# Patient Record
Sex: Female | Born: 1945 | Race: White | Hispanic: No | Marital: Married | Smoking: Never smoker
Health system: Southern US, Community
[De-identification: ages and names within clinical notes are randomized; demographics above are authoritative.]

## PROBLEM LIST (undated history)

## (undated) DIAGNOSIS — K5090 Crohn's disease, unspecified, without complications: Secondary | ICD-10-CM

## (undated) DIAGNOSIS — J189 Pneumonia, unspecified organism: Secondary | ICD-10-CM

## (undated) DIAGNOSIS — E785 Hyperlipidemia, unspecified: Secondary | ICD-10-CM

## (undated) DIAGNOSIS — G40909 Epilepsy, unspecified, not intractable, without status epilepticus: Secondary | ICD-10-CM

## (undated) DIAGNOSIS — C50919 Malignant neoplasm of unspecified site of unspecified female breast: Secondary | ICD-10-CM

## (undated) DIAGNOSIS — K56609 Unspecified intestinal obstruction, unspecified as to partial versus complete obstruction: Secondary | ICD-10-CM

## (undated) DIAGNOSIS — H269 Unspecified cataract: Secondary | ICD-10-CM

## (undated) DIAGNOSIS — C549 Malignant neoplasm of corpus uteri, unspecified: Secondary | ICD-10-CM

## (undated) DIAGNOSIS — D696 Thrombocytopenia, unspecified: Secondary | ICD-10-CM

## (undated) DIAGNOSIS — Z860100 Personal history of colon polyps, unspecified: Secondary | ICD-10-CM

## (undated) DIAGNOSIS — M810 Age-related osteoporosis without current pathological fracture: Secondary | ICD-10-CM

## (undated) DIAGNOSIS — N281 Cyst of kidney, acquired: Secondary | ICD-10-CM

## (undated) DIAGNOSIS — K529 Noninfective gastroenteritis and colitis, unspecified: Secondary | ICD-10-CM

## (undated) HISTORY — DX: Malignant neoplasm of corpus uteri, unspecified: C54.9

## (undated) HISTORY — DX: Age-related osteoporosis without current pathological fracture: M81.0

## (undated) HISTORY — PX: EYE SURGERY: SHX253

## (undated) HISTORY — DX: Hyperlipidemia, unspecified: E78.5

## (undated) HISTORY — DX: Crohn's disease, unspecified, without complications: K50.90

## (undated) HISTORY — PX: CHOLECYSTECTOMY: SHX55

## (undated) HISTORY — DX: Thrombocytopenia, unspecified: D69.6

## (undated) HISTORY — DX: Unspecified intestinal obstruction, unspecified as to partial versus complete obstruction: K56.609

## (undated) HISTORY — PX: ABDOMINAL HYSTERECTOMY: SHX81

## (undated) HISTORY — DX: Noninfective gastroenteritis and colitis, unspecified: K52.9

## (undated) HISTORY — DX: Pneumonia, unspecified organism: J18.9

## (undated) HISTORY — PX: ELBOW SURGERY: SHX618

## (undated) HISTORY — PX: UPPER GI ENDOSCOPY: SHX6162

## (undated) HISTORY — DX: Epilepsy, unspecified, not intractable, without status epilepticus: G40.909

## (undated) HISTORY — PX: BREAST LUMPECTOMY: SHX2

## (undated) HISTORY — DX: Cyst of kidney, acquired: N28.1

## (undated) HISTORY — DX: Unspecified cataract: H26.9

## (undated) HISTORY — DX: Malignant neoplasm of unspecified site of unspecified female breast: C50.919

## (undated) HISTORY — DX: Personal history of colonic polyps: Z86.010

## (undated) HISTORY — PX: COLON RESECTION: SHX5231

## (undated) HISTORY — DX: Personal history of colon polyps, unspecified: Z86.0100

---

## 2004-12-08 ENCOUNTER — Ambulatory Visit: Payer: Self-pay | Admitting: Oncology

## 2006-01-21 ENCOUNTER — Ambulatory Visit: Payer: Self-pay | Admitting: Oncology

## 2006-11-02 ENCOUNTER — Ambulatory Visit: Admission: RE | Admit: 2006-11-02 | Discharge: 2006-11-02 | Payer: Self-pay | Admitting: Gynecologic Oncology

## 2006-11-29 ENCOUNTER — Inpatient Hospital Stay (HOSPITAL_COMMUNITY): Admission: RE | Admit: 2006-11-29 | Discharge: 2006-12-02 | Payer: Self-pay | Admitting: Obstetrics & Gynecology

## 2006-11-29 ENCOUNTER — Encounter (INDEPENDENT_AMBULATORY_CARE_PROVIDER_SITE_OTHER): Payer: Self-pay | Admitting: *Deleted

## 2007-01-10 ENCOUNTER — Ambulatory Visit: Admission: RE | Admit: 2007-01-10 | Discharge: 2007-01-10 | Payer: Self-pay | Admitting: Gynecologic Oncology

## 2007-03-06 ENCOUNTER — Ambulatory Visit: Payer: Self-pay | Admitting: Oncology

## 2007-03-08 LAB — CBC WITH DIFFERENTIAL/PLATELET
BASO%: 0.4 % (ref 0.0–2.0)
Eosinophils Absolute: 0 10*3/uL (ref 0.0–0.5)
MCHC: 34.9 g/dL (ref 32.0–36.0)
MONO#: 0.2 10*3/uL (ref 0.1–0.9)
NEUT#: 2.7 10*3/uL (ref 1.5–6.5)
RBC: 3.85 10*6/uL (ref 3.70–5.32)
RDW: 14.2 % (ref 11.3–14.5)
WBC: 4.4 10*3/uL (ref 3.9–10.0)
lymph#: 1.4 10*3/uL (ref 0.9–3.3)

## 2007-03-08 LAB — COMPREHENSIVE METABOLIC PANEL
ALT: 33 U/L (ref 0–35)
Albumin: 3.3 g/dL — ABNORMAL LOW (ref 3.5–5.2)
CO2: 25 mEq/L (ref 19–32)
Glucose, Bld: 124 mg/dL — ABNORMAL HIGH (ref 70–99)
Potassium: 3.5 mEq/L (ref 3.5–5.3)
Sodium: 143 mEq/L (ref 135–145)
Total Protein: 5.9 g/dL — ABNORMAL LOW (ref 6.0–8.3)

## 2007-04-18 ENCOUNTER — Encounter (INDEPENDENT_AMBULATORY_CARE_PROVIDER_SITE_OTHER): Payer: Self-pay | Admitting: Specialist

## 2007-04-18 ENCOUNTER — Ambulatory Visit: Admission: RE | Admit: 2007-04-18 | Discharge: 2007-04-18 | Payer: Self-pay | Admitting: Gynecologic Oncology

## 2007-04-18 ENCOUNTER — Other Ambulatory Visit: Admission: RE | Admit: 2007-04-18 | Discharge: 2007-04-18 | Payer: Self-pay | Admitting: Gynecologic Oncology

## 2007-08-15 ENCOUNTER — Encounter (INDEPENDENT_AMBULATORY_CARE_PROVIDER_SITE_OTHER): Payer: Self-pay | Admitting: Gynecologic Oncology

## 2007-08-15 ENCOUNTER — Ambulatory Visit: Admission: RE | Admit: 2007-08-15 | Discharge: 2007-08-15 | Payer: Self-pay | Admitting: Gynecologic Oncology

## 2007-08-15 ENCOUNTER — Other Ambulatory Visit: Admission: RE | Admit: 2007-08-15 | Discharge: 2007-08-15 | Payer: Self-pay | Admitting: Gynecologic Oncology

## 2007-11-21 ENCOUNTER — Other Ambulatory Visit: Admission: RE | Admit: 2007-11-21 | Discharge: 2007-11-21 | Payer: Self-pay | Admitting: Gynecologic Oncology

## 2007-11-21 ENCOUNTER — Encounter (INDEPENDENT_AMBULATORY_CARE_PROVIDER_SITE_OTHER): Payer: Self-pay | Admitting: Gynecologic Oncology

## 2007-11-21 ENCOUNTER — Ambulatory Visit: Admission: RE | Admit: 2007-11-21 | Discharge: 2007-11-21 | Payer: Self-pay | Admitting: Obstetrics and Gynecology

## 2008-03-18 ENCOUNTER — Ambulatory Visit: Payer: Self-pay | Admitting: Oncology

## 2008-03-20 LAB — COMPREHENSIVE METABOLIC PANEL
Albumin: 3.5 g/dL (ref 3.5–5.2)
CO2: 25 mEq/L (ref 19–32)
Calcium: 8.4 mg/dL (ref 8.4–10.5)
Chloride: 107 mEq/L (ref 96–112)
Glucose, Bld: 125 mg/dL — ABNORMAL HIGH (ref 70–99)
Potassium: 3.6 mEq/L (ref 3.5–5.3)
Sodium: 143 mEq/L (ref 135–145)
Total Bilirubin: 0.2 mg/dL — ABNORMAL LOW (ref 0.3–1.2)
Total Protein: 5.8 g/dL — ABNORMAL LOW (ref 6.0–8.3)

## 2008-03-20 LAB — CBC WITH DIFFERENTIAL/PLATELET
BASO%: 0.3 % (ref 0.0–2.0)
Basophils Absolute: 0 10*3/uL (ref 0.0–0.1)
Eosinophils Absolute: 0 10*3/uL (ref 0.0–0.5)
HCT: 33.5 % — ABNORMAL LOW (ref 34.8–46.6)
HGB: 11.6 g/dL (ref 11.6–15.9)
LYMPH%: 32.2 % (ref 14.0–48.0)
MONO#: 0.2 10*3/uL (ref 0.1–0.9)
NEUT%: 62.8 % (ref 39.6–76.8)
Platelets: 196 10*3/uL (ref 145–400)
WBC: 5.1 10*3/uL (ref 3.9–10.0)
lymph#: 1.6 10*3/uL (ref 0.9–3.3)

## 2008-05-14 ENCOUNTER — Encounter (INDEPENDENT_AMBULATORY_CARE_PROVIDER_SITE_OTHER): Payer: Self-pay | Admitting: Gynecologic Oncology

## 2008-05-14 ENCOUNTER — Other Ambulatory Visit: Admission: RE | Admit: 2008-05-14 | Discharge: 2008-05-14 | Payer: Self-pay | Admitting: Gynecologic Oncology

## 2008-05-14 ENCOUNTER — Ambulatory Visit: Admission: RE | Admit: 2008-05-14 | Discharge: 2008-05-14 | Payer: Self-pay | Admitting: Gynecologic Oncology

## 2008-11-12 ENCOUNTER — Encounter (INDEPENDENT_AMBULATORY_CARE_PROVIDER_SITE_OTHER): Payer: Self-pay | Admitting: Gynecologic Oncology

## 2008-11-12 ENCOUNTER — Ambulatory Visit: Admission: RE | Admit: 2008-11-12 | Discharge: 2008-11-12 | Payer: Self-pay | Admitting: Gynecologic Oncology

## 2008-11-12 ENCOUNTER — Other Ambulatory Visit: Admission: RE | Admit: 2008-11-12 | Discharge: 2008-11-12 | Payer: Self-pay | Admitting: Gynecologic Oncology

## 2009-03-17 ENCOUNTER — Ambulatory Visit: Payer: Self-pay | Admitting: Oncology

## 2009-03-19 LAB — COMPREHENSIVE METABOLIC PANEL
ALT: 34 U/L (ref 0–35)
BUN: 13 mg/dL (ref 6–23)
CO2: 21 mEq/L (ref 19–32)
Calcium: 8.5 mg/dL (ref 8.4–10.5)
Chloride: 109 mEq/L (ref 96–112)
Creatinine, Ser: 1.01 mg/dL (ref 0.40–1.20)
Total Bilirubin: 0.3 mg/dL (ref 0.3–1.2)

## 2009-03-19 LAB — CBC WITH DIFFERENTIAL/PLATELET
BASO%: 0.2 % (ref 0.0–2.0)
Basophils Absolute: 0 10*3/uL (ref 0.0–0.1)
EOS%: 1.2 % (ref 0.0–7.0)
HCT: 35.9 % (ref 34.8–46.6)
HGB: 12.5 g/dL (ref 11.6–15.9)
LYMPH%: 31.7 % (ref 14.0–49.7)
MCH: 31.7 pg (ref 25.1–34.0)
MCHC: 34.8 g/dL (ref 31.5–36.0)
MONO#: 0.2 10*3/uL (ref 0.1–0.9)
NEUT%: 62.4 % (ref 38.4–76.8)
Platelets: 118 10*3/uL — ABNORMAL LOW (ref 145–400)
lymph#: 1.4 10*3/uL (ref 0.9–3.3)

## 2009-05-09 ENCOUNTER — Ambulatory Visit: Payer: Self-pay | Admitting: Oncology

## 2009-05-14 ENCOUNTER — Other Ambulatory Visit: Admission: RE | Admit: 2009-05-14 | Discharge: 2009-05-14 | Payer: Self-pay | Admitting: Gynecologic Oncology

## 2009-05-14 ENCOUNTER — Ambulatory Visit: Admission: RE | Admit: 2009-05-14 | Discharge: 2009-05-14 | Payer: Self-pay | Admitting: Gynecologic Oncology

## 2009-05-14 ENCOUNTER — Encounter (INDEPENDENT_AMBULATORY_CARE_PROVIDER_SITE_OTHER): Payer: Self-pay | Admitting: Gynecologic Oncology

## 2009-05-14 LAB — CBC WITH DIFFERENTIAL/PLATELET
BASO%: 0.7 % (ref 0.0–2.0)
Basophils Absolute: 0 10*3/uL (ref 0.0–0.1)
EOS%: 1.3 % (ref 0.0–7.0)
HCT: 36 % (ref 34.8–46.6)
HGB: 12.5 g/dL (ref 11.6–15.9)
MCH: 31.5 pg (ref 25.1–34.0)
MCHC: 34.7 g/dL (ref 31.5–36.0)
MONO#: 0.2 10*3/uL (ref 0.1–0.9)
RDW: 14 % (ref 11.2–14.5)
WBC: 4.6 10*3/uL (ref 3.9–10.3)
lymph#: 1.5 10*3/uL (ref 0.9–3.3)

## 2009-05-14 LAB — MORPHOLOGY: RBC Comments: NORMAL

## 2009-11-12 ENCOUNTER — Encounter (INDEPENDENT_AMBULATORY_CARE_PROVIDER_SITE_OTHER): Payer: Self-pay | Admitting: *Deleted

## 2009-11-12 ENCOUNTER — Other Ambulatory Visit: Admission: RE | Admit: 2009-11-12 | Discharge: 2009-11-12 | Payer: Self-pay | Admitting: Gynecologic Oncology

## 2009-11-12 ENCOUNTER — Ambulatory Visit: Admission: RE | Admit: 2009-11-12 | Discharge: 2009-11-12 | Payer: Self-pay | Admitting: Gynecologic Oncology

## 2009-12-03 DIAGNOSIS — M810 Age-related osteoporosis without current pathological fracture: Secondary | ICD-10-CM | POA: Insufficient documentation

## 2009-12-03 DIAGNOSIS — E785 Hyperlipidemia, unspecified: Secondary | ICD-10-CM | POA: Insufficient documentation

## 2009-12-03 DIAGNOSIS — R569 Unspecified convulsions: Secondary | ICD-10-CM | POA: Insufficient documentation

## 2009-12-03 DIAGNOSIS — C50919 Malignant neoplasm of unspecified site of unspecified female breast: Secondary | ICD-10-CM | POA: Insufficient documentation

## 2009-12-03 DIAGNOSIS — Z8719 Personal history of other diseases of the digestive system: Secondary | ICD-10-CM | POA: Insufficient documentation

## 2009-12-03 DIAGNOSIS — R197 Diarrhea, unspecified: Secondary | ICD-10-CM | POA: Insufficient documentation

## 2009-12-03 DIAGNOSIS — C549 Malignant neoplasm of corpus uteri, unspecified: Secondary | ICD-10-CM | POA: Insufficient documentation

## 2009-12-03 DIAGNOSIS — R109 Unspecified abdominal pain: Secondary | ICD-10-CM | POA: Insufficient documentation

## 2009-12-03 DIAGNOSIS — D126 Benign neoplasm of colon, unspecified: Secondary | ICD-10-CM | POA: Insufficient documentation

## 2009-12-09 ENCOUNTER — Ambulatory Visit: Payer: Self-pay | Admitting: Gastroenterology

## 2009-12-09 DIAGNOSIS — K624 Stenosis of anus and rectum: Secondary | ICD-10-CM | POA: Insufficient documentation

## 2009-12-09 DIAGNOSIS — E538 Deficiency of other specified B group vitamins: Secondary | ICD-10-CM | POA: Insufficient documentation

## 2009-12-09 DIAGNOSIS — R1031 Right lower quadrant pain: Secondary | ICD-10-CM | POA: Insufficient documentation

## 2009-12-09 DIAGNOSIS — Z8542 Personal history of malignant neoplasm of other parts of uterus: Secondary | ICD-10-CM | POA: Insufficient documentation

## 2009-12-09 LAB — CONVERTED CEMR LAB
AST: 38 units/L — ABNORMAL HIGH (ref 0–37)
Basophils Absolute: 0 10*3/uL (ref 0.0–0.1)
Basophils Relative: 0.9 % (ref 0.0–3.0)
Bilirubin, Direct: 0 mg/dL (ref 0.0–0.3)
CO2: 28 meq/L (ref 19–32)
Chloride: 109 meq/L (ref 96–112)
Eosinophils Relative: 1.3 % (ref 0.0–5.0)
Folate: 9 ng/mL
GFR calc non Af Amer: 66.69 mL/min (ref >60)
Glucose, Bld: 139 mg/dL — ABNORMAL HIGH (ref 70–99)
HCT: 38.1 % (ref 36.0–46.0)
Hemoglobin: 12.9 g/dL (ref 12.0–15.0)
Lymphocytes Relative: 21.7 % (ref 12.0–46.0)
MCHC: 33.8 g/dL (ref 30.0–36.0)
MCV: 96.3 fL (ref 78.0–100.0)
Monocytes Relative: 4.6 % (ref 3.0–12.0)
Platelets: 145 10*3/uL — ABNORMAL LOW (ref 150.0–400.0)
Potassium: 3.5 meq/L (ref 3.5–5.1)
RBC: 3.95 M/uL (ref 3.87–5.11)
Sed Rate: 20 mm/hr (ref 0–22)
Transferrin: 237.4 mg/dL (ref 212.0–360.0)
Vitamin B-12: 826 pg/mL (ref 211–911)

## 2009-12-10 ENCOUNTER — Telehealth: Payer: Self-pay | Admitting: Gastroenterology

## 2009-12-15 ENCOUNTER — Encounter: Payer: Self-pay | Admitting: Gastroenterology

## 2010-01-01 ENCOUNTER — Ambulatory Visit: Payer: Self-pay | Admitting: Gastroenterology

## 2010-01-01 DIAGNOSIS — R748 Abnormal levels of other serum enzymes: Secondary | ICD-10-CM | POA: Insufficient documentation

## 2010-02-03 ENCOUNTER — Ambulatory Visit: Payer: Self-pay | Admitting: Gastroenterology

## 2010-02-03 ENCOUNTER — Telehealth: Payer: Self-pay | Admitting: Gastroenterology

## 2010-03-16 ENCOUNTER — Ambulatory Visit: Payer: Self-pay | Admitting: Oncology

## 2010-03-18 ENCOUNTER — Encounter: Payer: Self-pay | Admitting: Gastroenterology

## 2010-03-24 ENCOUNTER — Encounter (INDEPENDENT_AMBULATORY_CARE_PROVIDER_SITE_OTHER): Payer: Self-pay | Admitting: *Deleted

## 2010-03-24 ENCOUNTER — Ambulatory Visit: Payer: Self-pay | Admitting: Gastroenterology

## 2010-03-24 DIAGNOSIS — E876 Hypokalemia: Secondary | ICD-10-CM | POA: Insufficient documentation

## 2010-03-24 DIAGNOSIS — K5000 Crohn's disease of small intestine without complications: Secondary | ICD-10-CM | POA: Insufficient documentation

## 2010-03-25 LAB — CONVERTED CEMR LAB
AST: 26 units/L (ref 0–37)
Albumin: 3.3 g/dL — ABNORMAL LOW (ref 3.5–5.2)
Alkaline Phosphatase: 80 units/L (ref 39–117)
Bilirubin Urine: NEGATIVE
CO2: 28 meq/L (ref 19–32)
CRP, High Sensitivity: 1.6 (ref 0.00–5.00)
Calcium: 8.7 mg/dL (ref 8.4–10.5)
GFR calc non Af Amer: 66.63 mL/min (ref >60)
HCT: 35.6 % — ABNORMAL LOW (ref 36.0–46.0)
Hemoglobin: 12.2 g/dL (ref 12.0–15.0)
Lymphocytes Relative: 26.8 % (ref 12.0–46.0)
Monocytes Relative: 4.1 % (ref 3.0–12.0)
Neutro Abs: 3.1 10*3/uL (ref 1.4–7.7)
Nitrite: NEGATIVE
Potassium: 4.5 meq/L (ref 3.5–5.1)
Sed Rate: 14 mm/hr (ref 0–22)
Sodium: 145 meq/L (ref 135–145)
Specific Gravity, Urine: 1.02 (ref 1.000–1.030)
TSH: 3.08 microintl units/mL (ref 0.35–5.50)
Total Bilirubin: 0.4 mg/dL (ref 0.3–1.2)
Total Protein: 5.9 g/dL — ABNORMAL LOW (ref 6.0–8.3)
Urine Glucose: NEGATIVE mg/dL
Urobilinogen, UA: 0.2 (ref 0.0–1.0)
WBC: 4.6 10*3/uL (ref 4.5–10.5)
pH: 5 (ref 5.0–8.0)

## 2010-03-26 ENCOUNTER — Telehealth: Payer: Self-pay | Admitting: Gastroenterology

## 2010-04-22 ENCOUNTER — Ambulatory Visit: Payer: Self-pay | Admitting: Gastroenterology

## 2010-04-23 ENCOUNTER — Telehealth: Payer: Self-pay | Admitting: Gastroenterology

## 2010-04-24 ENCOUNTER — Encounter: Payer: Self-pay | Admitting: Gastroenterology

## 2010-05-12 ENCOUNTER — Telehealth: Payer: Self-pay | Admitting: Gastroenterology

## 2010-05-13 ENCOUNTER — Encounter: Payer: Self-pay | Admitting: Gastroenterology

## 2010-05-13 ENCOUNTER — Other Ambulatory Visit: Admission: RE | Admit: 2010-05-13 | Discharge: 2010-05-13 | Payer: Self-pay | Admitting: Gynecologic Oncology

## 2010-05-13 ENCOUNTER — Ambulatory Visit: Admission: RE | Admit: 2010-05-13 | Discharge: 2010-05-13 | Payer: Self-pay | Admitting: Gynecologic Oncology

## 2010-05-20 ENCOUNTER — Telehealth: Payer: Self-pay | Admitting: Gastroenterology

## 2010-05-22 ENCOUNTER — Telehealth: Payer: Self-pay | Admitting: Gastroenterology

## 2010-05-25 ENCOUNTER — Ambulatory Visit: Payer: Self-pay | Admitting: Gastroenterology

## 2010-06-22 ENCOUNTER — Ambulatory Visit: Payer: Self-pay | Admitting: Gastroenterology

## 2010-06-23 LAB — CONVERTED CEMR LAB
Bilirubin, Direct: 0 mg/dL (ref 0.0–0.3)
Eosinophils Absolute: 0 10*3/uL (ref 0.0–0.7)
Eosinophils Relative: 0.9 % (ref 0.0–5.0)
Hemoglobin: 10.8 g/dL — ABNORMAL LOW (ref 12.0–15.0)
Lymphs Abs: 1.2 10*3/uL (ref 0.7–4.0)
MCHC: 34.3 g/dL (ref 30.0–36.0)
MCV: 96.8 fL (ref 78.0–100.0)
Monocytes Absolute: 0.2 10*3/uL (ref 0.1–1.0)
Neutro Abs: 3.5 10*3/uL (ref 1.4–7.7)
RBC: 3.26 M/uL — ABNORMAL LOW (ref 3.87–5.11)
RDW: 14.5 % (ref 11.5–14.6)
Total Protein: 5.5 g/dL — ABNORMAL LOW (ref 6.0–8.3)
WBC: 5 10*3/uL (ref 4.5–10.5)

## 2010-06-24 LAB — CONVERTED CEMR LAB
AST: 24 units/L (ref 0–37)
Albumin: 3 g/dL — ABNORMAL LOW (ref 3.5–5.2)
Alkaline Phosphatase: 100 units/L (ref 39–117)
Basophils Absolute: 0 10*3/uL (ref 0.0–0.1)
Bilirubin, Direct: 0.1 mg/dL (ref 0.0–0.3)
Eosinophils Absolute: 0 10*3/uL (ref 0.0–0.7)
HCT: 32 % — ABNORMAL LOW (ref 36.0–46.0)
MCV: 99 fL (ref 78.0–100.0)
Monocytes Relative: 3.6 % (ref 3.0–12.0)
Neutrophils Relative %: 71.2 % (ref 43.0–77.0)
Platelets: 163 10*3/uL (ref 150.0–400.0)
RBC: 3.24 M/uL — ABNORMAL LOW (ref 3.87–5.11)
RDW: 15.3 % — ABNORMAL HIGH (ref 11.5–14.6)
Total Bilirubin: 0.2 mg/dL — ABNORMAL LOW (ref 0.3–1.2)
Total Protein: 5.6 g/dL — ABNORMAL LOW (ref 6.0–8.3)
WBC: 5.1 10*3/uL (ref 4.5–10.5)

## 2010-07-20 ENCOUNTER — Ambulatory Visit: Payer: Self-pay | Admitting: Gastroenterology

## 2010-07-21 LAB — CONVERTED CEMR LAB
ALT: 103 units/L — ABNORMAL HIGH (ref 0–35)
Alkaline Phosphatase: 94 units/L (ref 39–117)
Basophils Absolute: 0 10*3/uL (ref 0.0–0.1)
Basophils Relative: 0.5 % (ref 0.0–3.0)
CO2: 30 meq/L (ref 19–32)
Calcium: 8.4 mg/dL (ref 8.4–10.5)
Chloride: 107 meq/L (ref 96–112)
Creatinine, Ser: 0.7 mg/dL (ref 0.4–1.2)
Eosinophils Absolute: 0 10*3/uL (ref 0.0–0.7)
GFR calc non Af Amer: 91.99 mL/min (ref >60)
Glucose, Bld: 94 mg/dL (ref 70–99)
Hemoglobin: 11.1 g/dL — ABNORMAL LOW (ref 12.0–15.0)
Lymphocytes Relative: 20.4 % (ref 12.0–46.0)
MCHC: 34.8 g/dL (ref 30.0–36.0)
MCV: 99.4 fL (ref 78.0–100.0)
Monocytes Absolute: 0.1 10*3/uL (ref 0.1–1.0)
Monocytes Relative: 4.3 % (ref 3.0–12.0)
Neutro Abs: 2.5 10*3/uL (ref 1.4–7.7)
RBC: 3.2 M/uL — ABNORMAL LOW (ref 3.87–5.11)
WBC: 3.3 10*3/uL — ABNORMAL LOW (ref 4.5–10.5)

## 2010-07-27 ENCOUNTER — Ambulatory Visit: Payer: Self-pay | Admitting: Cardiology

## 2010-07-28 ENCOUNTER — Ambulatory Visit: Payer: Self-pay | Admitting: Gastroenterology

## 2010-08-03 ENCOUNTER — Ambulatory Visit: Payer: Self-pay | Admitting: Gastroenterology

## 2010-08-05 ENCOUNTER — Ambulatory Visit: Payer: Self-pay

## 2010-08-05 ENCOUNTER — Encounter: Payer: Self-pay | Admitting: Gastroenterology

## 2010-08-05 LAB — CONVERTED CEMR LAB
Basophils Absolute: 0 10*3/uL (ref 0.0–0.1)
Basophils Relative: 0.4 % (ref 0.0–3.0)
Lymphocytes Relative: 22.5 % (ref 12.0–46.0)
MCHC: 34.5 g/dL (ref 30.0–36.0)
MCV: 99 fL (ref 78.0–100.0)
Monocytes Absolute: 0.3 10*3/uL (ref 0.1–1.0)
Monocytes Relative: 5 % (ref 3.0–12.0)
Neutro Abs: 4.5 10*3/uL (ref 1.4–7.7)
Neutrophils Relative %: 71.3 % (ref 43.0–77.0)
WBC: 6.3 10*3/uL (ref 4.5–10.5)

## 2010-08-25 ENCOUNTER — Ambulatory Visit: Payer: Self-pay | Admitting: Gastroenterology

## 2010-08-25 LAB — CONVERTED CEMR LAB
Basophils Absolute: 0 10*3/uL (ref 0.0–0.1)
Eosinophils Relative: 0.8 % (ref 0.0–5.0)
HCT: 35.9 % — ABNORMAL LOW (ref 36.0–46.0)
Hemoglobin: 12.3 g/dL (ref 12.0–15.0)
Lymphs Abs: 1 10*3/uL (ref 0.7–4.0)
MCHC: 34.3 g/dL (ref 30.0–36.0)
MCV: 98.4 fL (ref 78.0–100.0)
Monocytes Absolute: 0.2 10*3/uL (ref 0.1–1.0)
Neutro Abs: 3.8 10*3/uL (ref 1.4–7.7)
RBC: 3.65 M/uL — ABNORMAL LOW (ref 3.87–5.11)
RDW: 14.4 % (ref 11.5–14.6)

## 2010-09-30 ENCOUNTER — Ambulatory Visit: Payer: Self-pay | Admitting: Gastroenterology

## 2010-10-02 LAB — CONVERTED CEMR LAB
Basophils Absolute: 0 10*3/uL (ref 0.0–0.1)
Eosinophils Absolute: 0 10*3/uL (ref 0.0–0.7)
Hemoglobin: 11.4 g/dL — ABNORMAL LOW (ref 12.0–15.0)
MCHC: 34.7 g/dL (ref 30.0–36.0)
Monocytes Absolute: 0.2 10*3/uL (ref 0.1–1.0)
Neutro Abs: 1.8 10*3/uL (ref 1.4–7.7)
Neutrophils Relative %: 46 % (ref 43.0–77.0)
Platelets: 170 10*3/uL (ref 150.0–400.0)
RBC: 3.38 M/uL — ABNORMAL LOW (ref 3.87–5.11)
RDW: 14.8 % — ABNORMAL HIGH (ref 11.5–14.6)
WBC: 3.9 10*3/uL — ABNORMAL LOW (ref 4.5–10.5)

## 2010-10-30 ENCOUNTER — Telehealth: Payer: Self-pay | Admitting: Gastroenterology

## 2010-11-02 ENCOUNTER — Ambulatory Visit: Payer: Self-pay | Admitting: Gastroenterology

## 2010-11-02 LAB — CONVERTED CEMR LAB
Basophils Absolute: 0 10*3/uL (ref 0.0–0.1)
Eosinophils Relative: 0.7 % (ref 0.0–5.0)
Lymphocytes Relative: 32.1 % (ref 12.0–46.0)
Lymphs Abs: 1.4 10*3/uL (ref 0.7–4.0)
MCV: 99.2 fL (ref 78.0–100.0)
Monocytes Absolute: 0.3 10*3/uL (ref 0.1–1.0)
Monocytes Relative: 6.1 % (ref 3.0–12.0)
Neutro Abs: 2.6 10*3/uL (ref 1.4–7.7)
Neutrophils Relative %: 60.5 % (ref 43.0–77.0)
Platelets: 132 10*3/uL — ABNORMAL LOW (ref 150.0–400.0)
RBC: 3.26 M/uL — ABNORMAL LOW (ref 3.87–5.11)
RDW: 15.3 % — ABNORMAL HIGH (ref 11.5–14.6)

## 2010-11-13 ENCOUNTER — Telehealth: Payer: Self-pay | Admitting: Gastroenterology

## 2010-11-16 ENCOUNTER — Ambulatory Visit: Payer: Self-pay | Admitting: Gastroenterology

## 2010-11-18 ENCOUNTER — Ambulatory Visit
Admission: RE | Admit: 2010-11-18 | Discharge: 2010-11-18 | Payer: Self-pay | Source: Home / Self Care | Admitting: Gynecologic Oncology

## 2010-11-18 ENCOUNTER — Encounter: Payer: Self-pay | Admitting: Gastroenterology

## 2010-11-19 LAB — CONVERTED CEMR LAB
Eosinophils Absolute: 0 10*3/uL (ref 0.0–0.7)
Lymphs Abs: 1 10*3/uL (ref 0.7–4.0)
MCHC: 34.8 g/dL (ref 30.0–36.0)
Neutro Abs: 3.6 10*3/uL (ref 1.4–7.7)
RBC: 3.44 M/uL — ABNORMAL LOW (ref 3.87–5.11)
RDW: 14.9 % — ABNORMAL HIGH (ref 11.5–14.6)
WBC: 4.9 10*3/uL (ref 4.5–10.5)

## 2010-11-24 ENCOUNTER — Encounter: Payer: Self-pay | Admitting: Gastroenterology

## 2010-12-17 ENCOUNTER — Telehealth: Payer: Self-pay | Admitting: Gastroenterology

## 2011-01-12 ENCOUNTER — Ambulatory Visit
Admission: RE | Admit: 2011-01-12 | Discharge: 2011-01-12 | Payer: Self-pay | Source: Home / Self Care | Attending: Gastroenterology | Admitting: Gastroenterology

## 2011-01-12 ENCOUNTER — Other Ambulatory Visit: Payer: Self-pay | Admitting: Gastroenterology

## 2011-01-12 LAB — CBC WITH DIFFERENTIAL/PLATELET
Basophils Absolute: 0 10*3/uL (ref 0.0–0.1)
Basophils Relative: 0.3 % (ref 0.0–3.0)
HCT: 33.2 % — ABNORMAL LOW (ref 36.0–46.0)
Hemoglobin: 11.5 g/dL — ABNORMAL LOW (ref 12.0–15.0)
Lymphocytes Relative: 29.4 % (ref 12.0–46.0)
Lymphs Abs: 1.2 10*3/uL (ref 0.7–4.0)
MCV: 97.7 fl (ref 78.0–100.0)
Neutrophils Relative %: 64.2 % (ref 43.0–77.0)
Platelets: 132 10*3/uL — ABNORMAL LOW (ref 150.0–400.0)
RBC: 3.39 Mil/uL — ABNORMAL LOW (ref 3.87–5.11)
RDW: 14.4 % (ref 11.5–14.6)

## 2011-01-12 NOTE — Assessment & Plan Note (Signed)
Summary: OV 2 week follow up/  dfs   History of Present Illness Visit Type: follow up  Primary GI MD: Verl Blalock MD [place] Primary Provider: Martie Lee, MD  Requesting Provider: n/a Chief Complaint: 2 week f/u for crohn's. Pt denies any GI complaints  History of Present Illness:   This patient is extremely complex 65 year old Caucasian female with postcholecystectomy diarrhea which has abated with Colestid therapy. She is status post previous right hemicolectomy for possible Crohn's disease many years ago, also hysterectomy and bilateral ovarian removal by gynecologic oncology surgeons. She recently in November had acute abdominal pain and an abnormal CT scan and an abnormal colonoscopy.  She was recently seen in my clinic where a right lower quadrant mass was noted and repeat CT scan was ordered which again shows evidence of inflammatory bowel disease at the ileocolonic area. Rectal exam also suggested. Crohn's disease. However, all laboratory data was unremarkable except for some slightly increased serum transaminases and a low albumin of 3.2 g percent.  The patient currently is asymptomatic on Colestid 1 g a day and Entocort 9 mg a day. She denies fever, chills, regular complaints, abdominal pain, and her diarrhea is much improved. She denies any family history of liver disease or previous hepatitis. CT Scan review does show some slight dilatation of her common bile duct her bilirubin and alkaline phosphatase are normal. Patient gives no history of alcohol abuse.   GI Review of Systems      Denies abdominal pain, acid reflux, belching, bloating, chest pain, dysphagia with liquids, dysphagia with solids, heartburn, loss of appetite, nausea, vomiting, vomiting blood, weight loss, and  weight gain.        Denies anal fissure, black tarry stools, change in bowel habit, constipation, diarrhea, diverticulosis, fecal incontinence, heme positive stool, hemorrhoids, irritable bowel  syndrome, jaundice, light color stool, liver problems, rectal bleeding, and  rectal pain.    Current Medications (verified): 1)  Phenytoin 100 Mg/27m Susp (Phenytoin) .... 2 Caps in The Morning 2 Caps At Bedtime 2)  Simvastatin 10 Mg Tabs (Simvastatin) ..Marland Kitchen. 1 By Mouth Once Daily 3)  Evista 60 Mg Tabs (Raloxifene Hcl) ..Marland Kitchen. 1 By Mouth Once Daily 4)  Vitamin D 1000 Unit Tabs (Cholecalciferol) ..Marland Kitchen. 1 By Mouth Two Times A Day 5)  Calcarb 600 1500 Mg Tabs (Calcium Carbonate) ..Marland Kitchen. 1 By Mouth Two Times A Day 6)  Aspirin 81 Mg Tbec (Aspirin) ..Marland Kitchen. 1 By Mouth Once Daily 7)  Cyanocobalamin 1000 Mcg/ml Soln (Cyanocobalamin) ..Marland Kitchen. 1 Injection Every 3 Weeks 8)  Colestid 1 Gm Tabs (Colestipol Hcl) ..Marland Kitchen. 1 By Mouth Qd 9)  Entocort Ec 3 Mg  Cp24 (Budesonide) .... Take 3 Capsules Daily  Allergies (verified): 1)  ! * Tetanus  Past History:  Past medical, surgical, family and social histories (including risk factors) reviewed for relevance to current acute and chronic problems.  Past Medical History: Reviewed history from 12/09/2009 and no changes required. 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 Colon Resection Hysterectomy Cholecystectomy  Family History: Reviewed history from 12/09/2009 and no changes required. Family History of Breast Cancer:aunt No FH of Colon Cancer:  Social History: Reviewed history from 12/09/2009 and no changes required. Married 2 children Occupation: retired Patient has never smoked.  Alcohol Use -  no Daily Caffeine Use Illicit Drug Use - no  Review of Systems  The patient denies  allergy/sinus, anemia, anxiety-new, arthritis/joint pain, back pain, blood in urine, breast changes/lumps, change in vision, confusion, cough, coughing up blood, depression-new, fainting, fatigue, fever, headaches-new, hearing problems, heart murmur, heart rhythm changes, itching, menstrual pain, muscle pains/cramps, night sweats, nosebleeds, pregnancy symptoms, shortness of breath, skin rash, sleeping problems, sore throat, swelling of feet/legs, swollen lymph glands, thirst - excessive , urination - excessive , urination changes/pain, urine leakage, vision changes, and voice change.   General:  Denies fever, chills, sweats, anorexia, fatigue, weakness, malaise, weight loss, and sleep disorder. Eyes:  Denies blurring, diplopia, irritation, discharge, vision loss, scotoma, eye pain, and photophobia. ENT:  Denies earache, ear discharge, tinnitus, decreased hearing, nasal congestion, loss of smell, nosebleeds, sore throat, hoarseness, and difficulty swallowing. CV:  Denies chest pains, angina, palpitations, syncope, dyspnea on exertion, orthopnea, PND, peripheral edema, and claudication. Resp:  Denies dyspnea at rest, dyspnea with exercise, cough, sputum, wheezing, coughing up blood, and pleurisy. GI:  Denies difficulty swallowing, pain on swallowing, nausea, indigestion/heartburn, vomiting, vomiting blood, abdominal pain, jaundice, gas/bloating, diarrhea, constipation, change in bowel habits, bloody BM's, black BMs, and fecal incontinence. GU:  Denies urinary burning, blood in urine, nocturnal urination, urinary frequency, urinary incontinence, abnormal vaginal bleeding, amenorrhea, menorrhagia, vaginal discharge, pelvic pain, genital sores, painful intercourse, and decreased libido. Neuro:  Denies weakness, paralysis, abnormal sensation, seizures, syncope, tremors, vertigo, transient blindness, frequent falls, frequent headaches, difficulty walking, headache, sciatica, radiculopathy other:, restless legs,  memory loss, and confusion; she  does have a chronic seizure disorder and is on daily Dilantin.Marland Kitchen Psych:  Denies depression, anxiety, memory loss, suicidal ideation, hallucinations, paranoia, phobia, and confusion. Heme:  Denies bruising, bleeding, enlarged lymph nodes, and pagophagia.  Vital Signs:  Patient profile:   65 year old female Height:      62 inches Weight:      116 pounds BMI:     21.29 BSA:     1.52 Pulse rate:   88 / minute Pulse rhythm:   regular BP sitting:   100 / 68  (left arm) Cuff size:   regular  Vitals Entered By: Hope Pigeon CMA (January 01, 2010 9:50 AM)  Physical Exam  General:  Well developed, well nourished, no acute distress.healthy appearing.   Head:  Normocephalic and atraumatic. Eyes:  PERRLA, no icterus.exam deferred to patient's ophthalmologist.   Abdomen:  There continues to be a right lower quadrant linear mass that is palpable and freely movable. There is no significant tenderness, other abdominal masses, ascites, organomegaly. There no stigmata of chronic liver disease on exam. Bowel sounds are normal and nonobstructive in quality. Extremities:  No clubbing, cyanosis, edema or deformities noted. Neurologic:  Alert and  oriented x4;  grossly normal neurologically. Skin:  Intact without significant lesions or rashes. Psych:  Alert and cooperative. Normal mood and affect.   Impression & Recommendations:  Problem # 1:  VITAMIN B12 DEFICIENCY (ICD-266.2) Assessment Improved Serum B12 level is normal and she is on replacement therapy. Again, her B12 deficiency is probably related to her previous right hemicolectomy and ileectomy.  Problem # 2:  STENOSIS OF RECTUM AND ANUS (ICD-569.2) Assessment: Improved Digital dilation done on last exam and the patient is on Entocort therapy. As per above she does appear to have chronic inflammatory bowel disease.  Problem # 3:  ABDOMINAL PAIN-RLQ (ICD-789.03) Assessment: Improved This mass on exam continues  to be present and is probably related  to her inflammatory bowel disease and possible pelvic-anastomotic adhesions related to her previous pelvic surgery. He really does not feel inflammatory and CT scan showed no evidence of abscess formation or fistulization. Also, the patient looks and feels good today. She started does not appear chronically ill. I will continue her current medications with followup in one month's time and decide about repeat colonoscopy which is probably indicated. She is to call should she have a worsening of her symptomatology. We also will try to obtain her records concerning her previous gynecologic procedures.  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  Problem # 6:  SEIZURE DISORDER (ICD-780.39) Assessment: Unchanged continue Dilantin as per primary care  Problem # 7:  OTHER NONSPECIFIC ABNORMAL SERUM ENZYME LEVELS (ICD-790.5) Assessment: Unchanged Apparently she had mildly abnormal liver function tests for many years. There is no evidence on exam of chronic liver disease or hepatomegaly. CT Scan suggested possible mild common bile duct dilatation. However, her bilirubin and alkaline phosphatase are normal. This will need to be followed up in the future should these enzymes remain elevated despite treatment for IBD. Despite her low serum albumin level, there is no evidence of ascites or edema.  Patient Instructions: 1)  Copy sent to : Dr. Martie Lee 2)  Please continue current medications.  3)  Please schedule a follow-up appointment in 1 month.  4)  The medication list was reviewed and reconciled.  All changed / newly prescribed medications were explained.  A complete medication list was provided to the patient / caregiver.

## 2011-01-12 NOTE — Letter (Signed)
Summary: [place]   Imported By: Phillis Knack 04/10/2010 81:15:72  _____________________________________________________________________  External Attachment:    Type:   Image     Comment:   External Document

## 2011-01-12 NOTE — Progress Notes (Signed)
Summary: Medication  Phone Note From Pharmacy   Caller: [place]  [phone] Call For: Dr. Sharlett Iles  Summary of Call: Pharmacy has questions about the [place] that was sent in Initial call taken by: Webb Laws,  February 03, 2010 3:15 PM  Follow-up for Phone Call        Talked with pharmacist.   Follow-up by: Alberteen Spindle RN,  February 03, 2010 3:42 PM

## 2011-01-12 NOTE — Letter (Signed)
Summary: [facility]  Consult/   Imported By: Phillis Knack 11/20/2010 11:20:06  _____________________________________________________________________  External Attachment:    Type:   Image     Comment:   External Document
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
## 2011-01-12 NOTE — Procedures (Signed)
Summary: Colonoscopy  Patient: [name] Note: All result statuses are Final unless otherwise noted.  Tests: (1) Colonoscopy (COL)   COL Colonoscopy           [facility] [address]     [place], [place]  [address]           COLONOSCOPY PROCEDURE REPORT           PATIENT:  [name], [name]  MR#:  [id number]     BIRTHDATE:  October 07, 1946, 50 yrs. old  GENDER:  female     ENDOSCOPIST:  Loralee Pacas. Sharlett Iles, MD, [facility]     REF. BY:     PROCEDURE DATE:  04/22/2010     PROCEDURE:  Colonoscopy with biopsy     ASA CLASS:  Class II     INDICATIONS:  abdominal pain, abnormal CT of abdomen, Crohn's     disease RLQ MASS AND PRIOR SURGERY GREATER THAN 20Y AGO.ON PO     ENTOCORT.     MEDICATIONS:   Fentanyl 75 mcg IV, Versed 8 mg IV           DESCRIPTION OF PROCEDURE:   After the risks benefits and     alternatives of the procedure were thoroughly explained, informed     consent was obtained.  Digital rectal exam was performed and     revealed no abnormalities.   The LB [id number]-[id number] endoscope     was introduced through the anus and advanced to the anastamosis,     without limitations.  The quality of the prep was excellent, using     MoviPrep.  The instrument was then slowly withdrawn as the colon     was fully examined.     <<PROCEDUREIMAGES>>           FINDINGS:  There were inflammatory changes in the ileum consistent     wih Crohn's disease. INFLAMMED,GRANULAR ANASTOMOSIS WITH EROSIONS     BUT NO STENOSIS,,,BX. DONE.  No polyps or cancers were seen.  This     was otherwise a normal examination of the colon.   Retroflexed     views in the rectum revealed no abnormalities.    The scope was     then withdrawn from the patient and the procedure completed.           COMPLICATIONS:  None     ENDOSCOPIC IMPRESSION:     1) Ileitis - Crohn's     2) No polyps or cancers     3) Otherwise normal examination     ACTIVE,MILD IBD.     RECOMMENDATIONS:     1)  Await pathology results     2) Continue current medications     START 6-MP 50 MG/DAY,,,LABS ANF F/U 1 MONTH.     REPEAT EXAM:  No           ______________________________     Loralee Pacas. Sharlett Iles, MD, [facility]           CC:           n.     eSIGNED:   Loralee Pacas. Patterson at 04/22/2010 10:14 AM           [name], [id number]  Note: An exclamation mark (!) indicates a result that was not dispersed into the flowsheet. Document Creation Date: 04/22/2010 10:14 AM _______________________________________________________________________  (1) Order result status: Final Collection or observation date-time: 04/22/2010 10:03 Requested  date-time:  Receipt date-time:  Reported date-time:  Referring Physician:   Ordering Physician: Verl Blalock [phone]) Specimen Source:  Source: Tawanna Cooler Order Number: [id number] Lab site:

## 2011-01-12 NOTE — Progress Notes (Signed)
Summary: labs  ---- Converted from flag ---- ---- 10/02/2010 4:26 PM, Bernita Buffy CMA (AAMA) wrote: cbc due next week. ------------------------------   Phone Note Outgoing Call   Call placed by: Bernita Buffy CMA [facility]),  October 30, 2010 8:13 AM Call placed to: Patient Summary of Call: spoke with pt and she will come monday 11/02/2010 for labs, the orders are in [facility].  Initial call taken by: Bernita Buffy CMA [facility]),  October 30, 2010 8:14 AM

## 2011-01-12 NOTE — Progress Notes (Signed)
Summary: Question for nurse  Phone Note Call from Patient Call back at Home Phone [phone]   Call For: Dr Sharlett Iles Reason for Call: Talk to Nurse Summary of Call: Has a question for nurse about her medicines. Initial call taken by: Irwin Brakeman [facility],  May 12, 2010 1:43 PM  Follow-up for Phone Call        Pt has had 2 spells of RLQ pain which has lasted several hours.  Each time pt vomited and the pain stopped.  Pt has been on 6MP 2 weeks.  Pt has spell last pm but feels fine today.  Has return appt next week.  Follow-up by: Alberteen Spindle RN,  May 12, 2010 2:29 PM  Additional Follow-up for Phone Call Additional follow up Details #1::        noted Additional Follow-up by: Sable Feil MD [facility],  May 12, 2010 2:35 PM

## 2011-01-12 NOTE — Letter (Signed)
Summary: [facility] Instructions  [facility]  [place], [place] [address]   Phone: [phone]  Fax: [phone]       VERTIE DIBBERN    Oct 11, 1946    MRN: [id number]        Procedure Day Sudie Grumbling:  Wednesday, 04/22/10     Arrival Time: 9:00      Procedure Time: 10:00     Location of Procedure:                    _X _  [place] (4th Floor)                        [place]   Starting 5 days prior to your procedure 04/17/10 do not eat nuts, seeds, popcorn, corn, beans, peas,  salads, or any raw vegetables.  Do not take any fiber supplements (e.g. Metamucil, Citrucel, and Benefiber).  THE DAY BEFORE YOUR PROCEDURE         DATE: 04/21/10   DAY: Tuesday  1.  Drink clear liquids the entire day-NO SOLID FOOD  2.  Do not drink anything colored red or purple.  Avoid juices with pulp.  No orange juice.  3.  Drink at least 64 oz. (8 glasses) of fluid/clear liquids during the day to prevent dehydration and help the prep work efficiently.  CLEAR LIQUIDS INCLUDE: Water Jello Ice Popsicles Tea (sugar ok, no milk/cream) Powdered fruit flavored drinks Coffee (sugar ok, no milk/cream) Gatorade Juice: apple, white grape, white cranberry  Lemonade Clear bullion, consomm, broth Carbonated beverages (any kind) Strained chicken noodle soup Hard Candy                             4.  In the morning, mix first dose of MoviPrep solution:    Empty 1 Pouch A and 1 Pouch B into the disposable container    Add lukewarm drinking water to the top line of the container. Mix to dissolve    Refrigerate (mixed solution should be used within 24 hrs)  5.  Begin drinking the prep at 5:00 p.m. The MoviPrep container is divided by 4 marks.   Every 15 minutes drink the solution down to the next mark (approximately 8 oz) until the full liter is complete.   6.  Follow completed prep with 16 oz of clear liquid of your choice  (Nothing red or purple).  Continue to drink clear liquids until bedtime.  7.  Before going to bed, mix second dose of MoviPrep solution:    Empty 1 Pouch A and 1 Pouch B into the disposable container    Add lukewarm drinking water to the top line of the container. Mix to dissolve    Refrigerate  THE DAY OF YOUR PROCEDURE      DATE: 04/22/10   DAY: Wednesday  Beginning at 5:00 a.m. (5 hours before procedure):         1. Every 15 minutes, drink the solution down to the next mark (approx 8 oz) until the full liter is complete.  2. Follow completed prep with 16 oz. of clear liquid of your choice.    3. You may drink clear liquids until 8:00  (2 HOURS BEFORE PROCEDURE).   MEDICATION INSTRUCTIONS  Unless otherwise instructed, you should take regular prescription medications with a small sip of water   as early as possible  the morning of your procedure.                  OTHER INSTRUCTIONS  You will need a responsible adult at least 67 years of age to accompany you and drive you home.   This person must remain in the waiting room during your procedure.  Wear loose fitting clothing that is easily removed.  Leave jewelry and other valuables at home.  However, you may wish to bring a book to read or  an iPod/MP3 player to listen to music as you wait for your procedure to start.  Remove all body piercing jewelry and leave at home.  Total time from sign-in until discharge is approximately 2-3 hours.  You should go home directly after your procedure and rest.  You can resume normal activities the  day after your procedure.  The day of your procedure you should not:   Drive   Make legal decisions   Operate machinery   Drink alcohol   Return to work  You will receive specific instructions about eating, activities and medications before you leave.    The above instructions have been reviewed and explained to me by   _______________________    I fully understand  and can verbalize these instructions _____________________________ Date _________

## 2011-01-12 NOTE — Assessment & Plan Note (Signed)
Summary: 24-MONTH F/U APPT...LSW.   History of Present Illness Visit Type: Follow-up Visit Primary GI MD: Verl Blalock MD FACP [place] Primary Provider: Martie Lee, MD  Requesting Provider: n/a Chief Complaint: F/u for crohn's disease.  Pt states that she is better and denies any GI complaints  History of Present Illness:   65 year old Caucasian female with previous right hemicolectomy for inflammatory bowel disease who had been following now for several months because of right lower quadrant pain and an associated inflammatory mass that has slowly resolved with Entocort therapy. She has had serial CT scans and colonoscopy performed elsewhere., She is currently asymptomatic and denies abdominal pain. Her diarrhea seems to have responded to Colestid 1 g a day. There is no history of nausea vomiting, fever, chills, or other extracolonic manifestations of her IBD. She does have associated B12 deficiency and is on parenteral replacement therapy.She has a history of a rectal stricture that was dilated digitally on her last office visit. This seems to have helped her rectal outlet dysfunction greatly.   GI Review of Systems      Denies abdominal pain, acid reflux, belching, bloating, chest pain, dysphagia with liquids, dysphagia with solids, heartburn, loss of appetite, nausea, vomiting, vomiting blood, weight loss, and  weight gain.        Denies anal fissure, black tarry stools, change in bowel habit, constipation, diarrhea, diverticulosis, fecal incontinence, heme positive stool, hemorrhoids, irritable bowel syndrome, jaundice, light color stool, liver problems, rectal bleeding, and  rectal pain.    Current Medications (verified): 1)  Phenytoin 100 Mg/58m Susp (Phenytoin) .... 2 Caps in The Morning 2 Caps At Bedtime 2)  Simvastatin 10 Mg Tabs (Simvastatin) ..Marland Kitchen. 1 By Mouth Once Daily 3)  Evista 60 Mg Tabs (Raloxifene Hcl) ..Marland Kitchen. 1 By Mouth Once Daily 4)  Vitamin D 1000 Unit Tabs (Cholecalciferol)  ..Marland Kitchen. 1 By Mouth Two Times A Day 5)  Calcarb 600 1500 Mg Tabs (Calcium Carbonate) ..Marland Kitchen. 1 By Mouth Two Times A Day 6)  Aspirin 81 Mg Tbec (Aspirin) ..Marland Kitchen. 1 By Mouth Once Daily 7)  Cyanocobalamin 1000 Mcg/ml Soln (Cyanocobalamin) ..Marland Kitchen. 1 Injection Every 3 Weeks 8)  Colestid 1 Gm Tabs (Colestipol Hcl) ..Marland Kitchen. 1 By Mouth Qd 9)  Entocort Ec 3 Mg  Cp24 (Budesonide) .... Take 3 Capsules Daily  Allergies (verified): 1)  ! * Tetanus 2)  ! Penicillin  Past History:  Past medical, surgical, family and social histories (including risk factors) reviewed for relevance to current acute and chronic problems.  Past Medical History: Reviewed history from 12/09/2009 and no changes required. 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 Colon Resection Hysterectomy Cholecystectomy  Family History: Reviewed history from 12/09/2009 and no changes required. Family History of Breast Cancer:aunt No FH of Colon Cancer:  Social History: Reviewed history from 12/09/2009 and no changes required. Married 2 children Occupation: retired Patient has never smoked.  Alcohol Use - no Daily Caffeine Use Illicit Drug Use - no  Review of Systems       The patient complains of arthritis/joint pain.  The patient denies allergy/sinus, anemia, anxiety-new, back pain, blood in urine, breast changes/lumps, change in vision, confusion, cough, coughing up blood, depression-new, fainting, fatigue, fever, headaches-new, hearing problems, heart murmur, heart rhythm changes, itching, menstrual pain,  muscle pains/cramps, night sweats, nosebleeds,  pregnancy symptoms, shortness of breath, skin rash, sleeping problems, sore throat, swelling of feet/legs, swollen lymph glands, thirst - excessive , urination - excessive , urination changes/pain, urine leakage, vision changes, and voice change.    Vital Signs:  Patient profile:   65 year old female Height:      62 inches Weight:      111 pounds BMI:     20.38 BSA:     1.49 Pulse rate:   88 / minute Pulse rhythm:   regular BP sitting:   102 / 64  (left arm) Cuff size:   regular  Vitals Entered By: Hope Pigeon CMA (February 03, 2010 9:57 AM)  Physical Exam  General:  Well developed, well nourished, no acute distress.healthy appearing.   Head:  Normocephalic and atraumatic. Eyes:  PERRLA, no icterus.exam deferred to patient's ophthalmologist.   Abdomen:  There is a firm fully mobile fullness the right lower quadrant which is nontender and is smaller in size than on previous exams. Abdominal exam is otherwise unremarkable. Rectal:  deferred. Extremities:  No clubbing, cyanosis, edema or deformities noted. Neurologic:  Alert and  oriented x4;  grossly normal neurologically. Inguinal Nodes:  No significant inguinal adenopathy. Psych:  Alert and cooperative. Normal mood and affect.   Impression & Recommendations:  Problem # 1:  CROHN'S DISEASE, HX OF (ICD-V12.70) Assessment Improved Continue Entocort 9 mg a day for one more week, then taper to 6 mg a day with office followup in 6 weeks' time.  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  Problem # 4:  VITAMIN B12 DEFICIENCY (ICD-266.2) Assessment: Improved Continue monthly B12 injection therapy.  Patient Instructions: 1)  Take Entocort 3 tabs daily for one more week. 2)  Then decrease to 2 tabs daily. 3)  Prescription will be sent to  your pharmacy for Dukes magic mouth wash. 4)  Return appt in 6 weeks. 5)  The medication list was reviewed and reconciled.  All changed / newly prescribed medications were explained.  A complete medication list was provided to the patient / caregiver. 6)  Copy sent to : Dr. Martie Lee Prescriptions: FIRST-BXN MOUTHWASH  SUSP (DIPHENHYD-LIDOCAINE-NYSTATIN) 5 cc qid  #1 pt x 1   Entered by:   Alberteen Spindle RN   Authorized by:   Sable Feil MD [facility]   Signed by:   Alberteen Spindle RN on 02/03/2010   Method used:   Electronically to        [facility] [id number] (retail)       [address] [place], [place]  [address]       Ph: [phone]       Fax: [phone]   RxID:   [id number]

## 2011-01-12 NOTE — Assessment & Plan Note (Signed)
Summary: 3-WEEK F/U APPT...LSW.   History of Present Illness Visit Type: Follow-up Visit Primary GI MD: Verl Blalock MD FACP [place] Primary Provider: Martie Lee, MD  Requesting Provider: n/a Chief Complaint: F/u for crohn's. Pt states that she is great and denies any GI complaints  History of Present Illness:   Markedly improved on 6-MP 50 mg a day. She denies abdominal pain, diarrhea, nausea vomiting, rectal bleeding or any systemic complaints. 6-MP genetics were normal and I have increased her 6-MP dosage because of a low 6-TG level. 6 MMP levels were not increased.   GI Review of Systems      Denies abdominal pain, acid reflux, belching, bloating, chest pain, dysphagia with liquids, dysphagia with solids, heartburn, loss of appetite, nausea, vomiting, vomiting blood, weight loss, and  weight gain.        Denies anal fissure, black tarry stools, change in bowel habit, constipation, diarrhea, diverticulosis, fecal incontinence, heme positive stool, hemorrhoids, irritable bowel syndrome, jaundice, light color stool, liver problems, rectal bleeding, and  rectal pain.    Current Medications (verified): 1)  Phenytoin 100 Mg/38m Susp (Phenytoin) .... 2 Caps in The Morning 2 Caps At Bedtime 2)  Simvastatin 10 Mg Tabs (Simvastatin) ..Marland Kitchen. 1 By Mouth Once Daily 3)  Evista 60 Mg Tabs (Raloxifene Hcl) ..Marland Kitchen. 1 By Mouth Once Daily 4)  Vitamin D 1000 Unit Tabs (Cholecalciferol) ..Marland Kitchen. 1 By Mouth Two Times A Day 5)  Calcarb 600 1500 Mg Tabs (Calcium Carbonate) ..Marland Kitchen. 1 By Mouth Two Times A Day 6)  Aspirin 81 Mg Tbec (Aspirin) ..Marland Kitchen. 1 By Mouth Once Daily 7)  Cyanocobalamin 1000 Mcg/ml Soln (Cyanocobalamin) ..Marland Kitchen. 1 Injection Every Month 8)  Colestid 1 Gm Tabs (Colestipol Hcl) ..Marland Kitchen. 1 By Mouth Qd 9)  Klor-Con M20 20 Meq Cr-Tabs (Potassium Chloride Crys Cr) .... Take 2 Tablets By Mouth Two Times A Day 10)  Folic Acid 1 Mg Tabs (Folic Acid) ..Marland Kitchen. 1 By Mouth Qd 11)  Purinethol 50 Mg Tabs (Mercaptopurine) ....  Take One By Mouth Once Daily  Allergies (verified): 1)  ! * Tetanus 2)  ! Penicillin  Past History:  Past medical, surgical, family and social histories (including risk factors) reviewed for relevance to current acute and chronic problems.  Past Medical History: Reviewed history from 12/09/2009 and no changes required. 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 Colon Resection Hysterectomy Cholecystectomy Breast-Lumpectomy Left elbow surgery   Family History: Reviewed history from 12/09/2009 and no changes required. Family History of Breast Cancer:aunt No FH of Colon Cancer:  Social History: Reviewed history from 12/09/2009 and no changes required. Married 2 children Occupation: retired Patient has never smoked.  Alcohol Use - no Daily Caffeine Use Illicit Drug Use - no  Review of Systems  The patient denies allergy/sinus, anemia, anxiety-new, arthritis/joint pain, back pain, blood in urine, breast changes/lumps, change in vision, confusion, cough, coughing up blood, depression-new, fainting, fatigue, fever, headaches-new, hearing problems, heart murmur, heart rhythm changes, itching, menstrual pain, muscle pains/cramps, night sweats, nosebleeds, pregnancy symptoms, shortness of breath, skin rash, sleeping problems, sore throat, swelling of feet/legs, swollen lymph glands, thirst - excessive, urination - excessive, urination changes/pain, urine leakage, vision changes, and voice change.    Vital Signs:  Patient profile:   65year old  female Height:      62 inches Weight:      109 pounds BMI:      20.01 BSA:     1.48 Pulse rate:   88 / minute Pulse rhythm:   regular BP sitting:   100 / 64  (left arm) Cuff size:   regular  Vitals Entered By: Hope Pigeon CMA (August 25, 2010 4:10 PM)  Physical Exam  General:  Well developed, well nourished, no acute distress.healthy appearing.   Head:  Normocephalic and atraumatic. Eyes:  PERRLA, no icterus.exam deferred to patient's ophthalmologist.   Abdomen:  Mobile several centimeter right lower quadrant nontender mass again appreciated but smaller in size. Otherwise abdominal exam unremarkable. Psych:  Alert and cooperative. Normal mood and affect.   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 She also in the past has had surgery for breast carcinoma.  Patient Instructions: 1)  Copy sent to : Martie Lee, MD  2)  Please go to the basement today for your labs.  3)  And we will call you and remind you to come back and have another CBC done in one month. 4)  Please continue current medications.  5)  The medication list was reviewed and reconciled.  All changed / newly prescribed medications were explained.  A complete medication list was provided to the patient / caregiver.

## 2011-01-12 NOTE — Letter (Signed)
Summary: Patient Notice- Colon Biospy Results  [facility]  [address] [place], [place] [address]   Phone: [phone]  Fax: [phone]        Apr 24, 2010 MRN: [id number]    [facility] [place],   [address]    Dear Ms. Kumagai,  I am pleased to inform you that the biopsies taken during your recent colonoscopy did not show any evidence of cancer upon pathologic examination.  Additional information/recommendations:  __No further action is needed at this time.  Please follow-up with      your primary care physician for your other healthcare needs.  __Please call [phone] to schedule a return visit to review      your condition.  _xx_Continue with the treatment plan as outlined on the day of your      exam.We will call and start 6-MP rx...  __You should have a repeat colonoscopy examination for this problem           in _ years.  Please call us if you are having persistent problems or have questions about your condition that have not been fully answered at this time.  Sincerely,  Sable Feil MD [facility]   This letter has been electronically signed by your physician.  Appended Document: Patient Notice- Colon Biospy Results letter mailed

## 2011-01-12 NOTE — Progress Notes (Signed)
Summary: sch labs  Phone Note Call from Patient Call back at Home Phone [phone]   Caller: Patient Call For: Dr. Sharlett Iles Reason for Call: Talk to Nurse Summary of Call: would like to sch labs  Initial call taken by: Lucien Mons,  May 22, 2010 1:40 PM  Follow-up for Phone Call        Pt would like to come in Monday or Tues for labs.  Pt is sch to have surgery for broken elbow on 05/27/10.  Appt in [facility] for labs. Follow-up by: Alberteen Spindle RN,  May 22, 2010 1:55 PM

## 2011-01-12 NOTE — Letter (Signed)
Summary: Consult/[facility] [place]  Consult/[place]   Imported By: Phillis Knack 05/25/2010 07:38:57  _____________________________________________________________________  External Attachment:    Type:   Image     Comment:   External Document

## 2011-01-12 NOTE — Progress Notes (Signed)
Summary: med ?  Phone Note Call from Patient Call back at Home Phone [phone]   Caller: Patient Call For: Dr. Sharlett Iles Reason for Call: Talk to Nurse Summary of Call: pt wants to know if she should still take Folic Acid Initial call taken by: Lucien Mons,  March 26, 2010 10:49 AM  Follow-up for Phone Call        Pt states theer is  a durg interaction betweek folic acid and one of her siezure medications.  Pt instructed to check with MD that treats her seizures re safety of taking folic acid. Follow-up by: Alberteen Spindle RN,  March 26, 2010 12:56 PM

## 2011-01-12 NOTE — Assessment & Plan Note (Signed)
Summary: Follow up/dfs   History of Present Illness Visit Type: Follow-up Visit Primary GI MD: Verl Blalock MD [place] Primary Provider: Martie Lee, MD  Requesting Provider: n/a Chief Complaint: crohn's , patient not having any symptoms now History of Present Illness:   This Patient is stable and denies significant abdominal pain and has intermittent diarrhea worse relieved by Colestid 1 g a day. She's been on 6-MP for the last 3 months and has some new onset mild leukopenia with a white count of 3300. Actually reviewing her labs from December showed a mild anemia and leukopenia at that time, mild thrombocytopenia, and mildly abnormal liver function tests.  She has documented ileal Crohn's disease has had a previous right hemicolectomy. She is also status post exploratory surgery for uterine carcinoma, and has had breast carcinoma with a mastectomy. Recent gynecologic evaluation was stable. 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 There is borderline there are ductal dilatation but no evidence of other hepatic or pancreatic abnormalities.  She is all right of medications including Dilantin 2 mg twice a day, simvastatin 10 mg a day, Evista 60 mg a day, and she is tapering off of Entocort down to 3 mg a day. She denies systemic complaints, hepatobiliary complaints, melena or hematochezia. Her appetite is good and she is on her way to normal level.   GI Review of Systems      Denies abdominal pain, acid reflux, belching, bloating, chest pain, dysphagia with liquids, dysphagia with solids, heartburn, loss of appetite, nausea, vomiting, vomiting blood, weight loss, and  weight gain.      Reports diarrhea.     Denies anal fissure, black tarry stools, change in bowel habit, constipation, diverticulosis, fecal incontinence, heme positive stool, hemorrhoids, irritable bowel syndrome, jaundice, light  color stool, liver problems, rectal bleeding, and  rectal pain.    Current Medications (verified): 1)  Phenytoin 100 Mg/84m Susp (Phenytoin) .... 2 Caps in The Morning 2 Caps At Bedtime 2)  Simvastatin 10 Mg Tabs (Simvastatin) ..Marland Kitchen. 1 By Mouth Once Daily 3)  Evista 60 Mg Tabs (Raloxifene Hcl) ..Marland Kitchen. 1 By Mouth Once Daily 4)  Vitamin D 1000 Unit Tabs (Cholecalciferol) ..Marland Kitchen. 1 By Mouth Two Times A Day 5)  Calcarb 600 1500 Mg Tabs (Calcium Carbonate) ..Marland Kitchen. 1 By Mouth Two Times A Day 6)  Aspirin 81 Mg Tbec (Aspirin) ..Marland Kitchen. 1 By Mouth Once Daily 7)  Cyanocobalamin 1000 Mcg/ml Soln (Cyanocobalamin) ..Marland Kitchen. 1 Injection Every 3 Weeks 8)  Colestid 1 Gm Tabs (Colestipol Hcl) ..Marland Kitchen. 1 By Mouth Qd 9)  Entocort Ec 3 Mg  Cp24 (Budesonide) .... Take1 Capsules Daily 10)  Klor-Con M20 20 Meq Cr-Tabs (Potassium Chloride Crys Cr) .... Take 2 Tablets By Mouth Two Times A Day 11)  Folic Acid 1 Mg Tabs (Folic Acid) ..Marland Kitchen. 1 By Mouth Qd  Allergies (verified): 1)  ! * Tetanus 2)  ! Penicillin  Past History:  Past medical, surgical, family and social histories (including risk factors) reviewed for relevance to current acute and chronic problems.  Past Medical History: Reviewed history from 12/09/2009 and no changes required. 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 Colon Resection Hysterectomy Cholecystectomy Breast-Lumpectomy Left  elbow surgery   Family History: Reviewed history from 12/09/2009 and no changes required. Family History of Breast Cancer:aunt No FH of Colon Cancer:  Social History: Reviewed history from 12/09/2009 and  no changes required. Married 2 children Occupation: retired Patient has never smoked.  Alcohol Use - no Daily Caffeine Use Illicit Drug Use - no  Review of Systems  The patient denies allergy/sinus, anemia, anxiety-new, arthritis/joint pain, back pain, blood in urine, breast changes/lumps, change in vision, confusion, cough, coughing up blood, depression-new, fainting, fatigue, fever, headaches-new, hearing problems, heart murmur, heart rhythm changes, itching, menstrual pain, muscle pains/cramps, night sweats, nosebleeds, pregnancy symptoms, shortness of breath, skin rash, sleeping problems, sore throat, swelling of feet/legs, swollen lymph glands, thirst - excessive , urination - excessive , urination changes/pain, urine leakage, vision changes, and voice change.    Vital Signs:  Patient profile:   65 year old female Height:      62 inches Weight:      108 pounds BMI:     19.82 Pulse rate:   84 / minute Pulse rhythm:   regular BP sitting:   90 / 60  (left arm) Cuff size:   regular  Vitals Entered By: June McMurray [place] Deborra Medina) (July 28, 2010 3:59 PM)  Physical Exam  General:  Well developed, well nourished, no acute distress.healthy appearing.   Head:  Normocephalic and atraumatic. Eyes:  PERRLA, no icterus. Lungs:  Clear throughout to auscultation. Heart:  Regular rate and rhythm; no murmurs, rubs,  or bruits. Abdomen:  Abdomen Is not distended and there is no organomegaly or tenderness. She has a stable fullness in the right lower quadrant which is freely movable and approximately 5 cm x 2 cm in size. Bowel sounds are nonobstructive. Extremities:  No clubbing, cyanosis, edema or deformities noted. Neurologic:  Alert and  oriented x4;  grossly normal neurologically.   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 Clinically patient has improved, but I'm concerned about the development of fibrosing and stenosing disease or fistulizing Crohn's disease. She is scheduled for CBC in 5 days I'll schedule her office followup in 3 weeks. She may need referral to [facility] for second opinion, actually a third opinion. She is to continue her low-residue diet to be advanced as tolerated.  Orders: TLB-CRP-High Sensitivity (C-Reactive Protein) ([id number]-FCRP)  Problem # 2:  OTHER NONSPECIFIC ABNORMAL SERUM ENZYME LEVELS (ICD-790.5) Assessment: Deteriorated Chronic mild abnormal liver function test with borderline biliary ductal dilatation.She is status post cholecystectomy, and has no current hepatobiliary complaints. Some of her elevated liver enzymes may be due to use 6-MP metabolites, and [facility] metabolites rblood levels have been ordered.  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 Syble [place].  Patient Instructions: 1)  Please go to the basement for lab work. 2)  Restart 6 MP 1/2 tab every other day. 3)  Stop entocort. 4)  The medication list was reviewed and reconciled.  All changed / newly prescribed medications were explained.  A complete medication list  was provided to the patient / caregiver. 5)  Please schedule a follow-up appointment in 3 weeks.  6)  Advised to stick with a low residue diet  avoiding food that can irritate bowel (see handout).  7)  Copy sent to : Dr. Martie Lee  Appended Document: Follow up/dfs    Clinical Lists Changes  Medications: Removed medication of ENTOCORT EC 3 MG  CP24  (BUDESONIDE) Take1 capsules daily Added new medication of PURINETHOL 50 MG TABS (MERCAPTOPURINE) 1/2 tab every other day      Appended Document: Follow up/dfs increase 6 MP back to 25 mg/day....  Appended Document: Follow up/dfs    Phone Note Outgoing Call Call back at [facility] Phone [phone]   Call placed by: Bernita Buffy CMA Deborra Medina),  August 18, 2010 4:31 PM Summary of Call: Left a message on patients machine to call back. patient needs to increase her 71m to 283mper day. Initial call taken by: LeBernita BuffyMA (ADeborra Medina  August 18, 2010 4:32 PM  Follow-up for Phone Call        patient is aware she will increase to one tablet by mouth once daily Follow-up by: LeBernita BuffyMA (ADeborra Medina  August 18, 2010 4:40 PM    New/Updated Medications: PURINETHOL 50 MG TABS (MERCAPTOPURINE) take one by mouth once daily

## 2011-01-12 NOTE — Assessment & Plan Note (Signed)
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 She had surgical resection and chemotherapy, and has had recent gynecologic followup in June which showed no evidence of recurrent endometrial carcinoma.  Colonoscopy in June of this year showed mild anastomotic Crohn's disease confirmed by biopsies. There is no evidence of bowel obstruction, and she has improved on Entocort 9 mg a day, recently decreased gradually and discontinued. She is now been on 6-MP 50 mg a day for 3 months with stable CBC.  She has intermittent episodes of lower abdominal cramping with nausea and vomiting which last 24-48 hours. These episodes are not associated with any specific hepatobiliary complaints, diarrhea, rectal bleeding. She has a nonpalpable mass a right lower quadrant felt secondary to adhesions and scarring from previous surgery. She is on B12 replacement and a variety of multivitamins. She also has a chronic seizure disorder and is on Dilantin therapy. She denies any upper gastrointestinal or hepatobiliary complaints at this time. Her appetite is good her weight has been stable. She denies fever, chills, or other systemic complaints.   GI Review of Systems    Reports abdominal pain.     Location of  Abdominal pain: right side.    Denies acid reflux, belching, bloating, chest pain, dysphagia with liquids, dysphagia with solids, heartburn, loss of appetite, nausea, vomiting, vomiting blood, weight loss, and  weight gain.      Reports diarrhea.     Denies anal fissure, black tarry stools, change in bowel  habit, constipation, diverticulosis, fecal incontinence, heme positive stool, hemorrhoids, irritable bowel syndrome, jaundice, light color stool, liver problems, rectal bleeding, and  rectal pain.    Current Medications (verified): 1)  Phenytoin 100 Mg/61m Susp (Phenytoin) .... 2 Caps in The Morning 2 Caps At Bedtime 2)  Simvastatin 10 Mg Tabs (Simvastatin) ..Marland Kitchen. 1 By Mouth Once Daily 3)  Evista 60 Mg Tabs (Raloxifene Hcl) ..Marland Kitchen. 1 By Mouth Once Daily 4)  Vitamin D 1000 Unit Tabs (Cholecalciferol) ..Marland Kitchen. 1 By Mouth Two Times A Day 5)  Calcarb 600 1500 Mg Tabs (Calcium Carbonate) ..Marland Kitchen. 1 By Mouth Two Times A Day 6)  Aspirin 81 Mg Tbec (Aspirin) ..Marland Kitchen. 1 By Mouth Once Daily 7)  Cyanocobalamin 1000 Mcg/ml Soln (Cyanocobalamin) ..Marland Kitchen. 1 Injection Every 3 Weeks 8)  Colestid 1 Gm Tabs (Colestipol Hcl) ..Marland Kitchen. 1 By Mouth Qd 9)  Entocort Ec 3 Mg  Cp24 (Budesonide) .... Take 3 Capsules Daily For One Week Then Decrease To 2 Tabs Daily. 10)  Klor-Con M20 20 Meq Cr-Tabs (Potassium Chloride Crys Cr) .... Take 2 Tablets By Mouth Two Times A Day 11)  Folic Acid 1 Mg Tabs (Folic Acid) ..Marland Kitchen. 1 By Mouth Qd 12)  Mercaptopurine 50 Mg Tabs (Mercaptopurine) ..Marland Kitchen. 1 By Mouth Qd  Allergies (verified): 1)  ! * Tetanus 2)  ! Penicillin  Past History:  Past medical, surgical, family and social histories (including risk factors) reviewed for relevance to current acute and chronic problems.  Past Medical History: Reviewed history from 12/09/2009 and no changes required. 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 Family History of Breast Cancer:aunt No FH of Colon Cancer:  Social History: Reviewed history from 12/09/2009 and no changes required. Married 2 children Occupation: retired Patient has never smoked.  Alcohol Use - no Daily Caffeine Use Illicit Drug Use - no  Review of Systems       The patient complains of urination changes/pain.  The patient denies allergy/sinus, anemia, anxiety-new, arthritis/joint pain, back pain, blood in urine, breast changes/lumps, change in vision, confusion, cough, coughing up blood, depression-new, fainting, fatigue, fever, headaches-new, hearing problems, heart murmur, heart rhythm changes, itching, menstrual pain, muscle pains/cramps, night sweats, nosebleeds, pregnancy symptoms, shortness of breath, skin rash, sleeping problems, sore throat, swelling of feet/legs, swollen lymph glands, thirst - excessive , urination - excessive , urine leakage, vision changes, and voice change.    Vital Signs:  Patient profile:   65 year old female Height:      62 inches Weight:      109 pounds BMI:     20.01 BSA:     1.48 Pulse rate:   84 / minute Pulse rhythm:   regular BP sitting:   116 / 64  (left arm) Cuff size:   regular  Vitals Entered By: Hope Pigeon CMA (July 20, 2010 4:09 PM)  Physical Exam  General:  Well developed, well nourished, no acute distress.healthy appearing.   Head:  Normocephalic and atraumatic. Eyes:  PERRLA, no icterus.exam deferred to patient's ophthalmologist.   Abdomen:  Her abdomen is not distended or bloated and bowel sounds are normal and nonobstructive in quality. She has a continued hard palpable mass in the right lower quadrant area which has decreased in size from previous exams. Extremities:  No clubbing, cyanosis, edema or deformities noted. Neurologic:  Alert and  oriented x4;  grossly normal neurologically. Psych:  Alert and cooperative. Normal mood and  affect.   Impression & Recommendations:  Problem # 1:  CROHN'S DISEASE, SMALL INTESTINE (ICD-555.0) Assessment Improved Her anastomotic Crohn's disease has improved with Entocort and 6-MP therapy. I Do Not feel that her current episodes of intermittent nausea and vomiting and abdominal pain are related to her Crohn's disease, but are related to intestinal adhesions and intermittent small bowel obstruction. I have ordered CT enteroscopy to further visualize her small bowel, she may need small bowel camera exam. I've asked her to stop her Entocort but continue 6-MP at current levels. Labs repeated today as previously scheduled.Surgical consultation may be indicated. Orders: TLB-CBC Platelet - w/Differential (85025-CBCD) TLB-CMP (Comprehensive Metabolic Pnl) (88502-DXAJ) CT Enteroscopy (CT Enteroscopy)  Problem # 2:  HYPOKALEMIA (ICD-276.8) Assessment: Improved Continued potassium replacement therapy as tolerated. She has an element of bile-salt enteropathy and uses p.r.n. Colestid 1 g. She also is on calcium, vitamin D, and a distant. Additionally she takes folic acid 1 mg a day and B12 injections monthly.  Problem # 3:  VITAMIN B12 DEFICIENCY (ICD-266.2) Assessment: Improved  Problem # 4:  ABDOMINAL PAIN-RLQ (ICD-789.03) Assessment: Improved  Orders: CT Enteroscopy (CT Enteroscopy)  Problem # 5:  ADENOCARCINOMA, UTERUS, HX OF (ICD-V10.42) Assessment: Unchanged Gynecologic consultation per Dr. Alycia Rossetti reviewed from June 1 of this year.  Patient Instructions: 1)  Please go to the basement to have your lab tests drawn today.  2)  We will  call you with further follow up after reviewing these results. 3)  Your CT is scheduled at [facility] on 07/27/10.  See seperate instructions. 4)  The medication list was reviewed and reconciled.  All changed / newly prescribed medications were explained.  A complete medication list was provided to the patient / caregiver. 5)  Copy sent to : Dr. Alycia Rossetti  in gynecology and Dr. Burnis Kingfisher. 6)  Please continue current medications.

## 2011-01-12 NOTE — Progress Notes (Signed)
Summary: Questions  Phone Note Call from Patient Call back at Home Phone [phone]   Caller: Patient Call For: Dr. Sharlett Iles Reason for Call: Talk to Nurse Summary of Call: Pt wants to know if she can wait untill the 7th to get her blood drawn instead of monday, if she does not answer just leave a message Initial call taken by: Martinique Johnson,  November 13, 2010 10:54 AM  Follow-up for Phone Call        advised pt that is ok  Follow-up by: Bernita Buffy CMA ([place]),  November 13, 2010 11:26 AM

## 2011-01-12 NOTE — Progress Notes (Signed)
Summary: Rx and OV  Phone Note Outgoing Call   Call placed by: Nicole Spindle RN,  Apr 23, 2010 3:04 PM Summary of Call: Lm for pt to call.  See colon report.  Pt needs to start 6MP 50 mg a day and needs OV and lab in one month. Initial call taken by: Nicole Spindle RN,  Apr 23, 2010 3:04 PM  Follow-up for Phone Call        Pt notified.  Rx sent and appt sch. Follow-up by: Nicole Spindle RN,  Apr 23, 2010 4:46 PM    New/Updated Medications: MERCAPTOPURINE 50 MG TABS (MERCAPTOPURINE) 1 by mouth qd Prescriptions: MERCAPTOPURINE 50 MG TABS (MERCAPTOPURINE) 1 by mouth qd  #30 x 6   Entered by:   Nicole Spindle RN   Authorized by:   Sable Feil MD [facility]   Signed by:   Nicole Spindle RN on 04/23/2010   Method used:   Electronically to        [facility] [id number]* (retail)       [address] [place], [place]  [address]       Ph: [phone]       Fax: [phone]   RxID:   [id number]

## 2011-01-12 NOTE — Assessment & Plan Note (Signed)
Summary: 6WK FU/YF   History of Present Illness Visit Type: Follow-up Visit Primary GI MD: Verl Blalock MD [place] Primary Provider: Martie Lee, MD  Requesting Provider: n/a Chief Complaint: diarrhea, showing some improvement; abdominal pain has subsided History of Present Illness:   65 year old Caucasian female with chronic inflammatory bowel disease with previous right hemicolectomy continues with right lower quadrant discomfort and diarrhea currently on Entocort 6 mg a day and Colestid 1 g a day. Has history of previous uterine carcinoma hysterectomy, cholecystectomy, lumpectomy, and also suffers from osteoporosis, hyperlipidemia, seizure disorder, and recently diagnosed as having rather severe hypokalemia. She apparently was hospitalized with a serum potassium of 1.8 mEq per liter and received intravenous potassium and is now on oral potassium chloride 40 mEq a day.  She continues with mild diarrhea but her lower abdominal pain has diminished. She denies nausea vomiting or any specific hepatobiliary complaints. Her previous colonoscopy was done in [facility]. I reviewed her medications in detail and she does not appear to be on a diuretic. Despite all his complaints she's had no anorexia, weight loss, skin rashes, joint pains, oral stomatitis, fever chills. She denied to maintain her weight normally and denies any specific food intolerances.   GI Review of Systems      Denies abdominal pain, acid reflux, belching, bloating, chest pain, dysphagia with liquids, dysphagia with solids, heartburn, loss of appetite, nausea, vomiting, vomiting blood, weight loss, and  weight gain.      Reports diarrhea.     Denies anal fissure, black tarry stools, change in bowel habit, constipation, diverticulosis, fecal incontinence, heme positive stool, hemorrhoids, irritable bowel syndrome, jaundice, light color stool, liver problems, rectal bleeding, and  rectal pain.    Current  Medications (verified): 1)  Phenytoin 100 Mg/65m Susp (Phenytoin) .... 2 Caps in The Morning 2 Caps At Bedtime 2)  Simvastatin 10 Mg Tabs (Simvastatin) ..Marland Kitchen. 1 By Mouth Once Daily 3)  Evista 60 Mg Tabs (Raloxifene Hcl) ..Marland Kitchen. 1 By Mouth Once Daily 4)  Vitamin D 1000 Unit Tabs (Cholecalciferol) ..Marland Kitchen. 1 By Mouth Two Times A Day 5)  Calcarb 600 1500 Mg Tabs (Calcium Carbonate) ..Marland Kitchen. 1 By Mouth Two Times A Day 6)  Aspirin 81 Mg Tbec (Aspirin) ..Marland Kitchen. 1 By Mouth Once Daily 7)  Cyanocobalamin 1000 Mcg/ml Soln (Cyanocobalamin) ..Marland Kitchen. 1 Injection Every 3 Weeks 8)  Colestid 1 Gm Tabs (Colestipol Hcl) ..Marland Kitchen. 1 By Mouth Qd 9)  Entocort Ec 3 Mg  Cp24 (Budesonide) .... Take 3 Capsules Daily For One Week Then Decrease To 2 Tabs Daily. 10)  First-Bxn Mouthwash  Susp (Diphenhyd-Lidocaine-Nystatin) .... 5 Cc Qid 11)  Klor-Con M20 20 Meq Cr-Tabs (Potassium Chloride Crys Cr) .... Take 2 Tablets By Mouth Two Times A Day  Allergies (verified): 1)  ! * Tetanus 2)  ! Penicillin  Past History:  Past medical, surgical, family and social histories (including risk factors) reviewed for relevance to current acute and chronic problems.  Past Medical History: Reviewed history from 12/09/2009 and no changes required. 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 Family History of Breast Cancer:aunt No FH of Colon Cancer:  Social History: Reviewed history from 12/09/2009 and no changes required. Married 2 children Occupation:  retired Patient has never smoked.    Alcohol Use - no Daily Caffeine Use Illicit Drug Use - no  Review of Systems       The patient complains of muscle pains/cramps.  The patient denies allergy/sinus, anemia, anxiety-new, arthritis/joint pain, back pain, blood in urine, breast changes/lumps, change in vision, confusion, cough, coughing up blood, depression-new, fainting, fatigue, fever, headaches-new, hearing problems, heart murmur, heart rhythm changes, itching, menstrual pain, night sweats, nosebleeds, pregnancy symptoms, shortness of breath, skin rash, sleeping problems, sore throat, swelling of feet/legs, swollen lymph glands, thirst - excessive , urination - excessive , urination changes/pain, urine leakage, vision changes, and voice change.    Vital Signs:  Patient profile:   65 year old female Height:      62 inches Weight:      113.50 pounds BMI:     20.83 Pulse rate:   80 / minute Pulse rhythm:   regular BP sitting:   100 / 60  (left arm) Cuff size:   regular  Vitals Entered By: June McMurray Yulee Deborra Medina) (March 24, 2010 10:52 AM)  Physical Exam  General:  Well developed, well nourished, no acute distress.healthy appearing.  healthy appearing.   Head:  Normocephalic and atraumatic. Eyes:  PERRLA, no icterus.exam deferred to patient's ophthalmologist.  exam deferred to patient's ophthalmologist.   Lungs:  Clear throughout to auscultation. Heart:  Regular rate and rhythm; no murmurs, rubs,  or bruits. Abdomen:  5 x 5 centimeter mass in the right lower quadrant which is freely movable and nontender. Abdominal exam otherwise is unremarkable. Rectal:  Stenotic rectum which was dilated with my digit. There is slow liquid stool present which is guaiac positive. Cannot appreciate any rectal masses were appreciated any fissures or fistulae. Extremities:  No clubbing, cyanosis, edema or deformities noted. Neurologic:  Alert and  oriented x4;  grossly normal neurologically. Psych:  Alert and cooperative. Normal mood and  affect.   Impression & Recommendations:  Problem # 1:  CROHN'S DISEASE, SMALL INTESTINE (ICD-555.0) Assessment Improved I have scheduled colonoscopy to reassess her clinical situation in terms of activity of her Crohn's disease. I am concerned about her severe hypokalemia most likely from chronic diarrhea. This is a somewhat perplexing case where laboratory, radiographic, and physical exam findings exceed her clinical symptomatology. In fact, she is somewhat reluctant to undergo colonoscopy, and I'm not sure she will keep her appointment date. As mentioned above her stool is guaiac positive. We will repeat labs, continue meds at current levels, and hopefully be able to proceed with colonoscopy followup. Orders: TLB-CBC Platelet - w/Differential (85025-CBCD) TLB-BMP (Basic Metabolic Panel-BMET) (54098-[id number]) TLB-Hepatic/Liver Function Pnl (80076-HEPATIC) TLB-TSH (Thyroid Stimulating Hormone) (84443-TSH) TLB-B12, Serum-Total ONLY (29562-Z30) TLB-Ferritin (86578-ION) TLB-Folic Acid (Folate) (62952-WUX) TLB-IBC Pnl (Iron/FE;Transferrin) (83550-IBC) TLB-CRP-High Sensitivity (C-Reactive Protein) (86140-FCRP) TLB-Sedimentation Rate (ESR) (85652-ESR) TLB-Udip w/ Micro (81001-URINE)  Problem # 2:  VITAMIN B12 DEFICIENCY (ICD-266.2) Assessment: Unchanged Continue Replacement Therapy. Repeat labs ordered.  Problem # 3:  ABDOMINAL PAIN-RLQ (ICD-789.03) Assessment: Improved Colonoscopy scheduled hopefully.  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  Problem # 5:  SEIZURE DISORDER (ICD-780.39) Assessment: Improved Continue multiple medications per her primary care physician.  Problem # 6:  HYPOKALEMIA (ICD-276.8) Assessment:  Improved Etiology unclear. Repeat labs and urinalysis ordered.  Patient  Instructions: 1)  Please go to the basement for lab work. 2)  You will be scheduled for a colonoscopy at your convenience. 3)  Please continue current medications.  4)  The medication list was reviewed and reconciled.  All changed / newly prescribed medications were explained.  A complete medication list was provided to the patient / caregiver. 5)  Copy sent to : Dr. Martie Lee 6)  Please continue current medications.  7)  Colonoscopy and Flexible Sigmoidoscopy brochure given.  8)  Conscious Sedation brochure given.   Appended Document: 6WK FU/YF    Clinical Lists Changes  Medications: Added new medication of MOVIPREP 100 GM  SOLR (PEG-KCL-NACL-NASULF-NA ASC-C) As per prep instructions. - Signed Rx of MOVIPREP 100 GM  SOLR (PEG-KCL-NACL-NASULF-NA ASC-C) As per prep instructions.;  #1 x 0;  Signed;  Entered by: Alberteen Spindle RN;  Authorized by: Sable Feil MD [facility];  Method used: Electronically to [facility] [address] [place], [place]  [address], Ph: [phone], Fax: [phone] Orders: Added new Test order of Colonoscopy (Colon) - Signed    Prescriptions: MOVIPREP 100 GM  SOLR (PEG-KCL-NACL-NASULF-NA ASC-C) As per prep instructions.  #1 x 0   Entered by:   Alberteen Spindle RN   Authorized by:   Sable Feil MD [facility]   Signed by:   Alberteen Spindle RN on 03/24/2010   Method used:   Electronically to        [facility] [id number]* (retail)       [address] [place], [place]  [address]       Ph: [phone]       Fax: [phone]   RxID:   [id number]

## 2011-01-14 NOTE — Progress Notes (Signed)
Summary: sche  appt  Phone Note Call from Patient Call back at Home Phone [phone]   Caller: Patient Call For: Dr Sharlett Iles Reason for Call: Talk to Nurse Summary of Call: Patient wants to schedule a f.u office visit but she wants her labs on the same day. Initial call taken by: Ronalee Red,  December 17, 2010 2:00 PM  Follow-up for Phone Call        appt scheduled on 01/12/2011, she will have labs done on that day. Advised her to come early about one hour Follow-up by: Bernita Buffy CMA ([place]),  December 17, 2010 3:09 PM

## 2011-01-14 NOTE — Letter (Signed)
Summary: Office Visit Letter  [facility]  [address] [place], [place] [address]   Phone: [phone]  Fax: [phone]      November 24, 2010 MRN: [id number]   [facility] [place], [place]  [address]   Dear Ms. Baratta,   According to our records, it is time for you to schedule a follow-up office visit with [place].   At your convenience, please call [phone] (option #2)to schedule an office visit. If you have any questions, concerns, or feel that this letter is in error, we would appreciate your call.   Sincerely,   Loralee Pacas. Sharlett Iles, M.D.  [facility] [phone]

## 2011-01-20 NOTE — Assessment & Plan Note (Signed)
Summary: follow up crohns/lk   History of Present Illness Visit Type: Follow-up Visit Primary GI MD: Verl Blalock MD FACP [place] Primary Provider: Martie Lee, MD  Requesting Provider: n/a Chief Complaint: Crohn's, ending of flare that started Jan. 1, 2012 History of Present Illness:   This Patient is a 65 year old Caucasian female with chronic Crohn's disease with ileocolonic area. She has been on 6 MP 50 mg alternating every other day with 25 mg and has been in remission for almost a year. On January 1 she had some right lower quadrant pain and nausea and vomiting after being excessive roughage. She currently is asymptomatic and denies abdominal pain. She has loose stools controlled with Colestid daily.  She currently is without pain, nausea vomiting, fever, chills, or other systemic complaints. Review of her labs today shows a white count of 4000 which has been at this level for several months, platelet count stable at approximately 135,000, normal hemoglobin. She denies melena or manic easy. She has recently had a UTI and was treated with antibiotics by Dr. Margaretmary Bayley.   GI Review of Systems      Denies abdominal pain, acid reflux, belching, bloating, chest pain, dysphagia with liquids, dysphagia with solids, heartburn, loss of appetite, nausea, vomiting, vomiting blood, weight loss, and  weight gain.      Reports diarrhea.     Denies anal fissure, black tarry stools, change in bowel habit, constipation, diverticulosis, fecal incontinence, heme positive stool, hemorrhoids, irritable bowel syndrome, jaundice, light color stool, liver problems, rectal bleeding, and  rectal pain.    Current Medications (verified): 1)  Phenytoin 100 Mg/24m Susp (Phenytoin) .... 2 Caps in The Morning 2 Caps At Bedtime 2)  Simvastatin 10 Mg Tabs (Simvastatin) ..Marland Kitchen. 1 By Mouth Once Daily 3)  Evista 60 Mg Tabs (Raloxifene Hcl) ..Marland Kitchen. 1 By Mouth Once Daily 4)  Vitamin D 1000 Unit Tabs (Cholecalciferol) ..Marland Kitchen. 1 By Mouth  Two Times A Day 5)  Calcarb 600 1500 Mg Tabs (Calcium Carbonate) ..Marland Kitchen. 1 By Mouth Two Times A Day 6)  Aspirin 81 Mg Tbec (Aspirin) ..Marland Kitchen. 1 By Mouth Once Daily 7)  Cyanocobalamin 1000 Mcg/ml Soln (Cyanocobalamin) ..Marland Kitchen. 1 Injection Every Month 8)  Colestid 1 Gm Tabs (Colestipol Hcl) ..Marland Kitchen. 1 By Mouth Qd 9)  Klor-Con M20 20 Meq Cr-Tabs (Potassium Chloride Crys Cr) .... Take 2 Tablets By Mouth Two Times A Day 10)  Folic Acid 1 Mg Tabs (Folic Acid) ..Marland Kitchen. 1 By Mouth Qd 11)  Purinethol 50 Mg Tabs (Mercaptopurine) .... Take One By Mouth Every Other Day, and Take .5 Every Other Day.  Allergies (verified): 1)  ! * Tetanus 2)  ! Penicillin  Past History:  Past medical, surgical, family and social histories (including risk factors) reviewed for relevance to current acute and chronic problems.  Past Medical History: Reviewed history from 12/09/2009 and no changes required. 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 Colon Resection Hysterectomy Cholecystectomy Breast-Lumpectomy Left elbow surgery   Family History: Reviewed history from 12/09/2009 and no changes required. Family History of Breast Cancer:aunt No FH of Colon Cancer:  Social History: Reviewed history from 12/09/2009 and no changes required. Married 2 children Occupation: retired Patient has never smoked.  Alcohol Use - no Daily Caffeine Use Illicit Drug Use -  no  Review of Systems  The patient denies allergy/sinus, anemia, anxiety-new, arthritis/joint pain, back pain, blood in urine, breast changes/lumps, change in vision, confusion, cough,  coughing up blood, depression-new, fainting, fatigue, fever, headaches-new, hearing problems, heart murmur, heart rhythm changes, itching, menstrual pain, muscle pains/cramps, night sweats, nosebleeds, pregnancy symptoms, shortness of breath, skin rash, sleeping problems, sore throat, swelling of feet/legs, swollen lymph glands, thirst - excessive , urination - excessive , urination changes/pain, urine leakage, vision changes, and voice change.    Vital Signs:  Patient profile:   65 year old female Height:      62 inches Weight:      111.13 pounds BMI:     20.40 Pulse rate:   84 / minute Pulse rhythm:   regular BP sitting:   100 / 60  (left arm) Cuff size:   regular  Vitals Entered By: June McMurray Gwinn Deborra Medina) (January 12, 2011 3:45 PM)  Physical Exam  General:  Well developed, well nourished, no acute distress.healthy appearing.   Head:  Normocephalic and atraumatic. Eyes:  PERRLA, no icterus.exam deferred to patient's ophthalmologist.   Lungs:  Clear throughout to auscultation. Heart:  Regular rate and rhythm; no murmurs, rubs,  or bruits. Abdomen:  Soft, nontender and nondistended. No masses, hepatosplenomegaly or hernias noted. Normal bowel sounds.Knot and right lower quadrant seems smaller, is not tender and is freely movable as previously noted. Bowel sounds are active but not obstructive in quality. Extremities:  No clubbing, cyanosis, edema or deformities noted. Neurologic:  Alert and  oriented x4;  grossly normal neurologically. Psych:  Alert and cooperative. Normal mood and affect.   Impression & Recommendations:  Problem # 1:  CROHN'S DISEASE, SMALL INTESTINE (ICD-555.0) Assessment Improved Continue CBC liver profile and office visit every 3 months. I placed her on low fiber diet and daily Florstar for several weeks after antibiotic use. She is to call immediately should she have further problems, but she is doing extremely well at this time after extensive  evaluations.  Problem # 2:  VITAMIN B12 DEFICIENCY (ICD-266.2) Assessment: Improved continue monthly B12 injections as tolerated.  Problem # 3:  STENOSIS OF RECTUM AND ANUS (ICD-569.2) Assessment: Improved p.r.n. dilations as needed. She denies perirectal problems at this time.  Patient Instructions: 1)  Copy sent to : Martie Lee, MD  2)  Your prescription(s) have been sent to you pharmacy. 3)  You will need labs the same day as your next office visit.  4)  Please schedule a follow-up appointment in 3 months. 5)  Please continue current medications.  6)  The medication list was reviewed and reconciled.  All changed / newly prescribed medications were explained.  A complete medication list was provided to the patient / caregiver. Prescriptions: FLORASTOR 250 MG CAPS (SACCHAROMYCES BOULARDII) take one by mouth once daily  #30 x 6   Entered by:   Bernita Buffy CMA ([place])   Authorized by:   Sable Feil MD [facility]   Signed by:   Sable Feil MD [facility] on 01/12/2011   Method used:   Electronically to        [facility] [id number] (retail)       [address] [place], [place]  [address]       Ph: [phone]       Fax: [phone]   RxID:   [id number] PURINETHOL 50 MG TABS (MERCAPTOPURINE) take one by mouth every other day, and  take .5 every other day.  #30 x 6   Entered by:   Bernita Buffy CMA ([place])   Authorized by:   Sable Feil MD [facility]   Signed by:   Bernita Buffy CMA ([place]) on 01/12/2011   Method used:   Electronically to        [facility] [id number] (retail)       [address] [place], [place]  [address]       Ph: [phone]       Fax: [phone]   RxID:   [id number] COLESTID 1 GM TABS (COLESTIPOL HCL) 1 by mouth qd  #30 x 11   Entered by:   Bernita Buffy CMA ([place])   Authorized by:   Sable Feil MD [facility]   Signed by:   Bernita Buffy CMA ([place]) on 01/12/2011   Method used:   Electronically to        [facility] [id number] (retail)        [address] [place], [place]  [address]       Ph: [phone]       Fax: [phone]   RxID:   [id number]

## 2011-03-17 ENCOUNTER — Encounter (HOSPITAL_BASED_OUTPATIENT_CLINIC_OR_DEPARTMENT_OTHER): Payer: Medicare Other | Admitting: Oncology

## 2011-03-17 ENCOUNTER — Other Ambulatory Visit: Payer: Self-pay | Admitting: Oncology

## 2011-03-17 DIAGNOSIS — M810 Age-related osteoporosis without current pathological fracture: Secondary | ICD-10-CM

## 2011-03-17 DIAGNOSIS — Z853 Personal history of malignant neoplasm of breast: Secondary | ICD-10-CM

## 2011-03-17 DIAGNOSIS — R21 Rash and other nonspecific skin eruption: Secondary | ICD-10-CM

## 2011-03-17 DIAGNOSIS — C549 Malignant neoplasm of corpus uteri, unspecified: Secondary | ICD-10-CM

## 2011-03-17 DIAGNOSIS — G40909 Epilepsy, unspecified, not intractable, without status epilepticus: Secondary | ICD-10-CM

## 2011-03-17 DIAGNOSIS — D696 Thrombocytopenia, unspecified: Secondary | ICD-10-CM

## 2011-03-17 LAB — CBC WITH DIFFERENTIAL/PLATELET
Basophils Absolute: 0 10*3/uL (ref 0.0–0.1)
EOS%: 0.7 % (ref 0.0–7.0)
Eosinophils Absolute: 0 10*3/uL (ref 0.0–0.5)
HCT: 33.9 % — ABNORMAL LOW (ref 34.8–46.6)
LYMPH%: 26.4 % (ref 14.0–49.7)
MCHC: 34.3 g/dL (ref 31.5–36.0)
MCV: 96.6 fL (ref 79.5–101.0)
MONO%: 5.5 % (ref 0.0–14.0)
NEUT#: 2.2 10*3/uL (ref 1.5–6.5)
RBC: 3.51 10*6/uL — ABNORMAL LOW (ref 3.70–5.45)
RDW: 14.5 % (ref 11.2–14.5)

## 2011-03-30 ENCOUNTER — Ambulatory Visit (INDEPENDENT_AMBULATORY_CARE_PROVIDER_SITE_OTHER): Payer: Medicare Other | Admitting: Gastroenterology

## 2011-03-30 ENCOUNTER — Telehealth: Payer: Self-pay | Admitting: *Deleted

## 2011-03-30 ENCOUNTER — Other Ambulatory Visit (INDEPENDENT_AMBULATORY_CARE_PROVIDER_SITE_OTHER): Payer: Medicare Other

## 2011-03-30 ENCOUNTER — Other Ambulatory Visit: Payer: Self-pay | Admitting: Gastroenterology

## 2011-03-30 ENCOUNTER — Encounter: Payer: Self-pay | Admitting: Gastroenterology

## 2011-03-30 VITALS — BP 104/62 | HR 60 | Ht 62.0 in | Wt 112.6 lb

## 2011-03-30 DIAGNOSIS — T451X5A Adverse effect of antineoplastic and immunosuppressive drugs, initial encounter: Secondary | ICD-10-CM

## 2011-03-30 DIAGNOSIS — K50813 Crohn's disease of both small and large intestine with fistula: Secondary | ICD-10-CM

## 2011-03-30 DIAGNOSIS — D702 Other drug-induced agranulocytosis: Secondary | ICD-10-CM

## 2011-03-30 DIAGNOSIS — K5000 Crohn's disease of small intestine without complications: Secondary | ICD-10-CM

## 2011-03-30 DIAGNOSIS — D72819 Decreased white blood cell count, unspecified: Secondary | ICD-10-CM

## 2011-03-30 DIAGNOSIS — R1903 Right lower quadrant abdominal swelling, mass and lump: Secondary | ICD-10-CM

## 2011-03-30 DIAGNOSIS — K5080 Crohn's disease of both small and large intestine without complications: Secondary | ICD-10-CM

## 2011-03-30 DIAGNOSIS — E538 Deficiency of other specified B group vitamins: Secondary | ICD-10-CM

## 2011-03-30 LAB — CBC WITH DIFFERENTIAL/PLATELET
Basophils Absolute: 0 10*3/uL (ref 0.0–0.1)
Hemoglobin: 11.3 g/dL — ABNORMAL LOW (ref 12.0–15.0)
Lymphocytes Relative: 30.1 % (ref 12.0–46.0)
Lymphs Abs: 1 10*3/uL (ref 0.7–4.0)
MCHC: 34.5 g/dL (ref 30.0–36.0)
Monocytes Absolute: 0.2 10*3/uL (ref 0.1–1.0)
Monocytes Relative: 5.3 % (ref 3.0–12.0)
Neutro Abs: 2 10*3/uL (ref 1.4–7.7)
Neutrophils Relative %: 63.1 % (ref 43.0–77.0)
Platelets: 129 10*3/uL — ABNORMAL LOW (ref 150.0–400.0)
RBC: 3.34 Mil/uL — ABNORMAL LOW (ref 3.87–5.11)
RDW: 14.9 % — ABNORMAL HIGH (ref 11.5–14.6)
WBC: 3.2 10*3/uL — ABNORMAL LOW (ref 4.5–10.5)

## 2011-03-30 LAB — HEPATIC FUNCTION PANEL
ALT: 24 U/L (ref 0–35)
AST: 24 U/L (ref 0–37)
Bilirubin, Direct: 0.1 mg/dL (ref 0.0–0.3)
Total Bilirubin: 0.2 mg/dL — ABNORMAL LOW (ref 0.3–1.2)

## 2011-03-30 NOTE — Telephone Encounter (Signed)
Message copied by Bernita Buffy on Tue Mar 30, 2011  4:50 PM ------      Message from: PATTERSON, DAVID      Created: Tue Mar 30, 2011  4:42 PM       Decrease 6-MP to 25 mg daily. Repeat CBC in 2 weeks.

## 2011-03-30 NOTE — Patient Instructions (Signed)
Make an appt to come back and see Dr. Sharlett Iles in 3 months and you will need labs prior to this visit. I will call you to remind you of your labs.  When your labs come back we will contact you about your 6MP.

## 2011-03-30 NOTE — Progress Notes (Signed)
This is a 65 year old Caucasian female with Crohn's ileocolitis in remission on 6-MP 50 mg a day alternating with 25 mg every other day. Currently is asymptomatic and denies abdominal pain. She does have some diarrhea in spite of varying doses of Colestid. She is followed by Dr. Marko Plume for her previous mastectomy for breast cancer. Her appetite is good her weight is stable she denies any systemic complaints, infection, or coagulation problems.  Current Medications, Allergies, Past Medical History, Past Surgical History, Family History and Social History were reviewed in [employer] record.  Pertinent Review of Systems Negative   Physical Exam: Awake alert no acute distress appearing her stated age. There are no stigmata of chronic liver disease or lymphadenopathy noted chest is clear and there are no murmurs gallops rubs cardiac exam. Abdominal exam shows no organomegaly but a small freely movable several centimeter mass the right lower quadrant felt on previous exams. This is nontender not fluctuant. Bowel sounds are normal. Extremities were unremarkable. Mental status is normal and there no gross neurological deficits.  Past Medical History  Diagnosis Date  . Personal history of malignant neoplasm of other parts of uterus   . Osteoporosis, unspecified   . Other and unspecified hyperlipidemia   . Other convulsions   . Malignant neoplasm of breast (female), unspecified site   . Malignant neoplasm of corpus uteri, except isthmus   . Abdominal pain, unspecified site   . Diarrhea   . Personal history of colonic polyps     hyperplastic  . Crohn's disease    Past Surgical History  Procedure Date  . Colon resection   . Abdominal hysterectomy   . Cholecystectomy   . Breast lumpectomy   . Elbow surgery     left    reports that she has never smoked. She has never used smokeless tobacco. She reports that she does not drink alcohol or use illicit drugs. family history  includes Aneurysm in her father; Breast cancer in her maternal aunt; and Heart disease in her mother. Allergies  Allergen Reactions  . Penicillins   . Tetanus Toxoid        Assessment and Plan: Crohn's disease with previous partial colectomy all in remission on 6-MP therapy. She has had some mild leukopenia which we are monitoring closely. We'll adjust her 6-MP dosage dependent on her blood counts and liver tests that were drawn today. Otherwise she is to continue her medications as outlined office followup in 3 months time. No diagnosis found. Please copy her primary care physician, referring physician, and pertinent subspecialists.

## 2011-03-31 NOTE — Telephone Encounter (Signed)
Patient aware she needs to decrease her 88m to 241mand she will have her labs drawn in 2 weeks at Dr. WhJunious Silkffice. I will fax order over to their office for her to have labs done 04/14/2011, and then [place]

## 2011-04-21 ENCOUNTER — Encounter: Payer: Self-pay | Admitting: Gastroenterology

## 2011-04-26 ENCOUNTER — Telehealth: Payer: Self-pay | Admitting: Gastroenterology

## 2011-04-26 NOTE — Telephone Encounter (Signed)
Notified pt we received her labs and we are waiting for Dr Sharlett Iles to review. We will call her once reviewed.

## 2011-04-27 NOTE — Consult Note (Signed)
[name], PAYEUR NO.:  [id number]   MEDICAL RECORD NO.:  [id number]          PATIENT TYPE:  OUT   LOCATION:  GYN                          FACILITY:  [facility]   PHYSICIAN:  John T. Clarene Essex, M.D.    DATE OF BIRTH:  [birth year]   DATE OF CONSULTATION:  04/18/2007  DATE OF DISCHARGE:                                 CONSULTATION   CHIEF COMPLAINT:  Followup of endometrial cancer.   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  She has returned to full activity  and is working, stating that as she has begun standing for long periods  of time, she has noted intermittent left leg edema.  She denies leg  pain, cords or symptoms of cellulitis.  She tripped over carpeting  during [employer] and had marked ecchymosis of her right arm and  increased swelling of left leg which is gradually improving.  Bowel and  bladder functions are normal.  She denies vaginal bleeding.   PAST HISTORY:  Significant for T1, N0 breast cancer, seizure disorder,  Crohn's disease, post ileal resection with vitamin B12 deficiency,  hyperlipidemia and osteoporosis in addition to endometrial cancer  surgery.   MEDICATIONS:  Include Caltrate, vitamin D, Lipitor, Percocet p.r.n. back  pain, Dilantin, baby aspirin, vitamin B12 shots and potassium chloride.   ALLERGIES:  None known.   PERSONAL AND SOCIAL HISTORY:  Nonsmoker.   FAMILY HISTORY:  No significant breast or gynecologic malignancy.   REVIEW OF SYSTEMS:  Other than noted above, negative in the 10-point  review.   PHYSICAL EXAMINATION:  VITAL SIGNS:  Weight 110 pounds.  Vital signs  stable.  GENERAL:  The patient is anxious, alert and oriented x3, in no acute  distress.  LYMPH SURVEY:  Negative for pathologic lymphadenopathy.  MUSCULOSKELETAL:  There is no back or CVA tenderness.  ABDOMEN:  Soft  and benign with well-healed incision.  No ascites,  tenderness, mass or hernia.  EXTREMITIES:  Full strength and range of motion.  There are marked  ecchymoses on the forearms, right greater than left, with edema.  There  is trace to 1+ left leg edema which extends above the thigh, most  compatible lymphedema.  No cords, Homans' or tenderness.   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   PLAN:  The patient is reassured regarding her endometrial cancer status.  We can see her back for followup at 3 to 69-monthintervals to complete  the year of surveillance, and then she could be seen at 610-monthintervals.  Cytology obtained and will be communicated to the patient.      John T. SoClarene EssexM.D.  Electronically Signed     JTS/MEDQ  D:  04/18/2007  T:  04/18/2007  Job:  [id number] cc:   JoEnedina Finner Fax: [phone] RaAbbe AmsterdamFax: : [phone] LiAgnes LawrenceM.D.  Fax: [phone] NaCaswell CorwinR.N.  [address], [place] [address]

## 2011-04-27 NOTE — Consult Note (Signed)
NAMEPAUL, TORPEY NO.:  [id number]   MEDICAL RECORD NO.:  [id number]          PATIENT TYPE:  OUT   LOCATION:  GYN                          FACILITY:  [facility]   PHYSICIAN:  John T. Clarene Essex, M.D.    DATE OF BIRTH:  07/02/1946   DATE OF CONSULTATION:  11/21/2007  DATE OF DISCHARGE:                                 CONSULTATION   CHIEF COMPLAINT:  Follow-up of endometrial cancer.   HISTORY OF PRESENT ILLNESS:  This patient underwent TAH/BSO, nodes and  omental biopsies with washings in December 2007.  She had a stage IB,  grade 1 endometrial adenocarcinoma.  She has been followed at frequent  intervals with normal evaluations.  She has rare leg swelling with  prolonged standing or sitting.  She denies pelvic pain, alteration of  bowel or bladder functions or vaginal bleeding.   PAST HISTORY:  1. Stage T1, N0 breast cancer.  2. Seizure disorder.  3. Crohn's disease.  4. Post ileal resection.  5. Vitamin B-12 deficiency.  6. Hyperlipidemia.  7. Osteoporosis.  8. Endometrial cancer surgery.   MEDICATIONS:  1. Caltrate.  2. Vitamin DIAGNOSIS.  3. Lipitor.  4. Percocet p.r.n. back pain.  5. Dilantin.  6. Baby aspirin.  7. Vitamin B-12 shots.  8. Potassium chloride.   ALLERGIES:  None known.   PERSONAL AND SOCIAL HISTORY:  Nonsmoker.   FAMILY HISTORY:  No significant breast or gynecologic malignancy known.   REVIEW OF SYSTEMS:  Other than above, negative 10-point review.   PHYSICAL EXAMINATION:  VITAL SIGNS:  Weight increased to 116 pounds, and  vital signs otherwise stable.  GENERAL:  The patient is anxious, alert and oriented x3, in no acute  distress.  LYMPHATIC SURVEY:  Negative for pathologic lymphadenopathy.  BACK:  No spinous or CVA tenderness.  ABDOMEN:  Soft and benign with well-healed midline incision.  No hernia,  ascites, tenderness or mass.  EXTREMITIES:  Full strength and range of motion without edema.  PELVIC:  External genitalia,  vaginal vault, bladder and urethra are  normal.  Bimanual and rectovaginal examinations reveal absent uterus and  cervix with no mass or nodularity.   ASSESSMENT:  Endometrial carcinoma.  No evidence of disease.   PLAN:  The patient can be seen at 1-monthintervals.  Cytology obtained  and will be communicated with the patient.      John T. SClarene Essex M.D.  Electronically Signed     JTS/MEDQ  D:  11/21/2007  T:  11/22/2007  Job:  [id number]  cc:   JEnedina Finner  Fax: [phone]  RAbbe [place] Fax:: [phone]  LAgnes Lawrence M.D.  Fax: [phone]  NCaswell Corwin R.N.  [address] [place] [address]

## 2011-04-27 NOTE — Consult Note (Signed)
NAMEAUDRAY, [name] NO.:  [id number]   MEDICAL RECORD NO.:  [id number]          PATIENT TYPE:  OUT   LOCATION:  GYN                          FACILITY:  [facility]   PHYSICIAN:  John T. Clarene Essex, M.D.    DATE OF BIRTH:  [birth year]   DATE OF CONSULTATION:  08/15/2007  DATE OF DISCHARGE:                                 CONSULTATION   CHIEF COMPLAINT:  Follow-up of endometrial cancer.   HISTORY OF PRESENT ILLNESS:  This patient underwent TAH/BSO, nodes and  omental biopsies with washings in 11/2006.  She had a stage IB grade 1  endometrial adenocarcinoma.  She has been followed at frequent intervals  within normal evaluations.  Her leg swelling has improved and she has  had no need for diuretics.  She denies pelvic pain, alteration of bowel  or bladder function or vaginal swelling.   PAST HISTORY:  T1/NO breast cancer, seizure disorder, Crohn disease,  post ileal resection, vitamin B12 deficiency, hyperlipidemia, and  osteoporosis in addition to endometrial cancer surgery.   MEDICATIONS:  Caltrate, vitamin D, Lipitor, Percocet p.r.n. back pain,  Dilantin, baby aspirin, vitamin B12 shots and potassium chloride.   ALLERGIES:  None known.   PERSONAL AND SOCIAL HISTORY:  Nonsmoker.   FAMILY HISTORY:  No significant breast or gynecologic malignancy known.   REVIEW OF SYSTEMS:  Other than above, negative in the 10-point review.   EXAM:  Weight 114 pounds and vital signs stable.  The patient is  anxious, alert and oriented x3 in no acute distress.  Lymphatic survey  is negative for pathologic lymphadenopathy.  BACK:  No spinous or CVA tenderness.  ABDOMEN:  Soft and benign with well-healed midline incision.  No hernia,  ascites, tenderness or mass.  EXTREMITIES:  Full strength and range of motion without edema.  PELVIC:  External genitalia, vaginal vault, bladder and urethra are  normal.  Bimanual and rectovaginal examinations reveal absent uterus and  cervix, no mass  or nodularity.   ASSESSMENT:  Endometrial carcinoma, no evidence of disease.   PLAN:  We will see the patient back for follow-up in approximately 3-4  months and then she could be seen at 39-monthintervals.  Cytology  obtained and will be communicated to the patient.      John T. SClarene Essex M.D.  Electronically Signed     JTS/MEDQ  D:  08/15/2007  T:  08/15/2007  Job:  [id number]  cc:   JEnedina Finner  Fax: [phone]  RAbbe [place] Fax:: [phone]  LAgnes Lawrence M.D.  Fax: [phone]  NCaswell Corwin R.N.  [address] [place] [address]

## 2011-04-27 NOTE — Consult Note (Signed)
[name], WATTS NO.:  [id number]   MEDICAL RECORD NO.:  [id number]          PATIENT TYPE:  OUT   LOCATION:  GYN                          FACILITY:  [facility]   PHYSICIAN:  John T. Clarene Essex, M.D.    DATE OF BIRTH:  10-May-1946   DATE OF CONSULTATION:  DATE OF DISCHARGE:                                 CONSULTATION   CHIEF COMPLAINT:  Followup of endometrial cancer.   HISTORY OF PRESENT ILLNESS:  This patient underwent total hysterectomy  with BSO, nodes, and omental biopsies in December 2007 with final  diagnosis of stage IB, grade 1 endometrial adenocarcinoma.  She has been  followed recently at 35-monthintervals with normal evaluations.  She  denies pelvic pain, alteration of bowel or bladder function, vaginal  bleeding, or leg swelling.  She has had an interval evaluation by Dr.  LMarko Plumefor her breast cancer.   PAST MEDICAL HISTORY:  1. Stage T1 N0 breast cancer.  2. Seizure disorder.  3. Crohn disease.  4. Prior ileal resection with vitamin B12 deficiency.  5. Hyperlipidemia.  6. Osteoporosis.  7. Endometrial cancer surgery as above.   MEDICATIONS:  1. Caltrate.  2. Multivitamins.  3. Lipitor.  4. Dilantin.  5. Baby aspirin.  6. Vitamin B12.  7. Potassium.  8. Percocet p.r.n. chronic back pain.   ALLERGIES:  NONE KNOWN.   PERSONAL AND SOCIAL HISTORY:  Nonsmoker.   FAMILY HISTORY:  No significant breast or gynecologic malignancy known.   REVIEW OF SYSTEMS:  Other than above, negative 10-point review.   EXAM:  Weight 122 pounds.  Blood pressure 122/80.  Pulse 76.  Patient is  alert and oriented x3 in no acute distress.  LYMPH SURVEY:  Negative for pathologic lymphadenopathy.  BACK:  No spinous or CVA tenderness.  ABDOMEN:  Soft and benign with no hernia, ascites, or mass and no  tenderness.  EXTREMITIES:  Full strength and range of motion without edema, cords, or  Homan's.  PELVIC:  External genitalia and BUS, bladder and urethra normal.   The  vaginal mucosa is clear without lesions.  Bimanual and rectovaginal  examinations disclose absent uterus and cervix with no mass or  nodularity and no tenderness.   ASSESSMENT:  Endometrial carcinoma, no disease.   PLAN:  Cytology obtained and patient will continue 676-monthollowup  until she has completed 5 years since surgery.      John T. SoClarene EssexM.D.  Electronically Signed     JTS/MEDQ  D:  11/12/2008  T:  11/13/2008  Job:  [id number] cc:   RaAbbe AmsterdamFax: [phone] JoEnedina Finner Fax: [phone] NaCaswell CorwinR.N.  [address] [id number]

## 2011-04-27 NOTE — Consult Note (Signed)
NAMECURTIS, [name] NO.:  [id number]   MEDICAL RECORD NO.:  [id number]          PATIENT TYPE:  OUT   LOCATION:  GYN                          FACILITY:  [facility]   PHYSICIAN:  John T. Clarene Essex, M.D.    DATE OF BIRTH:  September 19, 1946   DATE OF CONSULTATION:  DATE OF DISCHARGE:                                 CONSULTATION   CHIEF COMPLAINT:  Follow-up of endometrial cancer.   CHIEF COMPLAINT:  Follow-up of endometrial cancer.   HISTORY OF PRESENT ILLNESS:  This patient underwent TAH with BSO, nodes  and omental biopsies in 11/2006 with a final diagnosis of stage Ib,  grade 1 endometrial adenocarcinoma.  She has been followed recently at 6-  month intervals with normal evaluations.  She denies pelvic pain,  alteration of bowel or bladder function, vaginal bleeding or leg  swelling.  She has had an interval negative evaluation by Dr. Marko Plume  for her breast cancer.   PAST MEDICAL HISTORY:  1. Stage T1N0 breast cancer in 1990.  2. Seizure disorder.  3. Crohn disease.  4. Prior ileal resection with vitamin B12 deficiency.  5. Hyperlipidemia.  6. Osteoporosis.  7. Endometrial cancer surgery as above.   MEDICATIONS:  Caltrate, multivitamins, Lipitor, Dilantin, baby aspirin,  vitamin B12, potassium, Percocet p.r.n. chronic back pain.   ALLERGIES:  None known.   PERSONAL AND SOCIAL HISTORY:  Nonsmoker.   FAMILY HISTORY:  No significant breast or gynecologic malignancy history  known.   REVIEW OF SYSTEMS:  Other than above, 10-point review negative.   PHYSICAL EXAMINATION:  VITAL SIGNS:  Weight 125 pounds, vital signs  stable/afebrile.  GENERAL:  The patient is alert and oriented x3 in no acute distress.  LYMPH SURVEY:  Negative for pathologic lymphadenopathy.  BACK:  No spinous or CVA tenderness.  ABDOMEN:  Soft and benign with no hernia, ascites, mass or tenderness.  EXTREMITIES:  Full strength and range of motion without edema, cords or  Homan's.  PELVIC:   External genitalia and BUS, bladder and urethra normal.  Vaginal mucosa is clear without lesions and is atrophic.  Bimanual and  rectovaginal examinations disclose absent uterus and cervix with no mass  or nodularity and no tenderness.   ASSESSMENT:  Endometrial carcinoma, no evidence of disease.   PLAN:  Signal symptoms of recurrent cancer were discussed with the  patient as she expressed anxiety about her prospects for remaining  disease-free.  Her risk of recurrence continues to drop at each visit  and she is reassured regarding the absence of symptoms or signs of  recurrent disease.  Cytology will be communicated to the patient and she  will continue 50-monthfollow-up until she has completed 5 years  surveillance      John T. SClarene Essex M.D.  Electronically Signed     JTS/MEDQ  D:  05/14/2009  T:  05/14/2009  Job:  [id number]  cc:   NCaswell Corwin R.N.  [address] [address]  RAbbe Amsterdam Fax: [phone]  Lennis P. LMarko Plume M.D.  Fax: [phone]  JEnedina Finner  Fax:  [phone] 

## 2011-04-27 NOTE — Consult Note (Signed)
[name], DUBINSKY NO.:  [id number]   MEDICAL RECORD NO.:  [id number]          PATIENT TYPE:  OUT   LOCATION:  GYN                          FACILITY:  [facility]   PHYSICIAN:  John T. Clarene Essex, M.D.    DATE OF BIRTH:  04-01-1946   DATE OF CONSULTATION:  05/14/2008  DATE OF DISCHARGE:                                 CONSULTATION   CHIEF COMPLAINT:  This patient returns for followup of endometrial  cancer.  She underwent TH/BSO, nodes and omental biopsies in December  2007 with final diagnosis of stage IB, grade I endometrial  adenocarcinoma.  Normal examinations have been recorded and I saw her  last in December 2008.  She has had minimal problem with leg swelling.  She denies pelvic pain, alteration of bowel or bladder functions, or  vaginal bleeding.  She was seen by Dr. Marko Plume in April with a negative  followup examination of her breast cancer.   PAST MEDICAL HISTORY:  1. Stage T1, N0 breast cancer.  2. Seizure disorder.  3. Crohn's disease.  4. Prior ileal resection with vitamin B12 deficiency.  5. Hyperlipidemia.  6. Osteoporosis.  7. Endometrial cancer surgery as above.   MEDICATIONS:  Caltrate, multivitamins, Lipitor, Dilantin, baby aspirin,  vitamin B12 shots, potassium supplementation, and Percocet p.r.n.  chronic back pain.   ALLERGIES:  None known.   PERSONAL/SOCIAL HISTORY:  The patient is a nonsmoker.   FAMILY HISTORY:  No significant breast or gynecologic malignancy known.   REVIEW OF SYSTEMS:  Other than the above, negative 10 point review.   EXAMINATION:  Weight 120 pounds.  Vital signs stable.  Pain score is  zero.  The patient is anxious, alert and oriented x3, in no acute distress.  LYMPH SURVEY:  No pathologic lymphadenopathy.  BACK:  No spinous or CVA tenderness.  ABDOMEN:  Well healed midline incision with no hernia, ascites,  tenderness or mass.  EXTREMITIES:  Full strength and range of motion without cords, edema or  Homan's  sign.  PELVIC:  External genitalia EBUS.  Bladder and urethra are normal.  Bimanual and rectovaginal examinations disclose absent uterus and cervix  with no mass or nodularity, and no tenderness.   ASSESSMENT:  1. Endometrial carcinoma, no active disease.  2. Breast cancer, no active disease.   PLAN:  The patient will be informed of today's cytology, and will  continue to be followed at six month intervals.      John T. Clarene Essex, M.D.  Electronically Signed     JTS/MEDQ  D:  05/14/2008  T:  05/14/2008  Job:  [id number]   cc:   Lennis P. Marko Plume, M.D.  Fax: [place]  Fax: [phone]   Enedina Finner.  Fax: [phone]   Agnes Lawrence, M.D.  Fax: [phone]   Caswell Corwin, R.N.  [address], [place] [address]

## 2011-04-28 ENCOUNTER — Other Ambulatory Visit: Payer: Self-pay | Admitting: Gastroenterology

## 2011-04-28 ENCOUNTER — Telehealth: Payer: Self-pay | Admitting: *Deleted

## 2011-04-28 DIAGNOSIS — K5000 Crohn's disease of small intestine without complications: Secondary | ICD-10-CM

## 2011-04-28 NOTE — Telephone Encounter (Signed)
Patient aware labs ok and and she will be due in Aug she would like labs done at her primary care md which is [facility] Fax number [phone], phone number is [phone]. I will print order and print it off and fax it. i will send a reminder flag to myself. She also needs ov in 6 months.

## 2011-04-30 ENCOUNTER — Encounter: Payer: Self-pay | Admitting: Gastroenterology

## 2011-04-30 NOTE — Op Note (Signed)
NAMEFATEMAH, POURCIAU NO.:  [id number]   MEDICAL RECORD NO.:  [id number]          PATIENT TYPE:  INP   LOCATION:  X003                         FACILITY:  [facility]   PHYSICIAN:  Marti Sleigh, M.D.DATE OF BIRTH:  1946-10-25   DATE OF PROCEDURE:  11/29/2006  DATE OF DISCHARGE:                               OPERATIVE REPORT   PREOPERATIVE DIAGNOSIS:  Endometrial carcinoma.   POSTOPERATIVE DIAGNOSIS:  Endometrial carcinoma.   PROCEDURE:  Total abdominal hysterectomy, bilateral salpingo-  oophorectomy, pelvic lymphadenectomy.   SURGEON:  Daniel L. Clarke-Pearson, MD   ASSISTANT:  Lahoma Crocker, MD  Caswell Corwin, R.N.   ANESTHESIA:  General with orotracheal tube.   ESTIMATED BLOOD LOSS:  100 mL.   SURGICAL FINDINGS:  At the time of exploratory laparotomy, the omentum  was densely adherent to the prior midline incision.  The upper abdomen  including the liver, diaphragm, spleen, stomach were normal.  The  omentum appeared normal except for a large hole in the center of it.  The pelvic and periaortic nodes were grossly normal.  She did have an  inflammatory area of mesentery of the small bowel at the prior small  bowel colonic anastomosis.  There is no evidence of obstruction or  stricturing.  The uterus was normal as were the tubes and ovaries.  On  frozen section, the uterus had a small amount of endometrial carcinoma  invading only 2 mm.   PROCEDURE:  The patient brought to the operating room and after  satisfactory attainment of general anesthesia, was placed in a modified  lithotomy position in [place].  The anterior abdominal wall,  perineum, and vagina were prepped with Betadine.  A Foley catheter was  placed, and the patient was draped.  The abdomen was entered by excising  the prior lower portion of her large midline incision.  Adhesions of the  omentum to the prior midline incision were immediately encountered,  requiring sharp  and blunt dissection to free it from its peritoneal  attachments.   Small bowel was then packed out of the pelvis.  Peritoneal washings were  obtained and sent to cytopathology.  Bookwalter retractor was  positioned.  The uterus was grasped with long Kelly clamps.  The round  ligaments were divided, and the retroperitoneal spaces including the  paravesical and pararectal spaces were opened, identifying the pelvic  sidewall vessels and ureter.  The ovarian vessels were skeletonized,  clamped, cut, free-tied, and suture ligated with 2-0 Vicryl.  The  bladder flap was advanced through sharp and blunt dissection.  The  uterine vessels were skeletonized then clamped, cut, and suture ligated.  In a stepwise fashion, the paracervical tissue, cardinal ligament, and  vaginal angles were clamped, divided, and suture ligated.  The vagina  was transected from its connection to the cervix.  Vaginal angles were  transfixed with 2-0 Vicryl and the central portion of vagina closed with  interrupted figure-of-eight sutures of 0 Vicryl.   The paravesical spaces were further developed. and the pelvic  lymphadenectomy was then performed, excising lymph nodes from the  external iliac artery and vein, internal iliac  artery, and obturator  fossa.  Throughout the dissection, care was taken to avoid vascular  injury.  The genitofemoral nerve and obturator nerve were identified and  protected throughout the dissection.  When the frozen section returned  showing only minimal superficial invasion, it was decided not to proceed  with periaortic lymphadenectomy given the adhesions in this region and  the inflammation of the small bowel mesentery.   A portion of the omentum was then excised because of a large hole in it  to avoid an internal hernia.  Vascular pedicles were developed, clamped,  and free tied.  The abdomen and pelvis were irrigated with warm saline.  Packs and retractors were removed.  The anterior  abdominal wall was  closed in layers, the first being a running mass closure using #1 PDS.  The subcutaneous tissue was irrigated, hemostasis achieved with cautery,  and the skin closed with skin staples.  Dressing was applied.  The  patient was awakened from anesthesia and taken to the recovery room in  satisfactory condition.  Sponge, needle, and instrument counts correct  x2.      Marti Sleigh, M.D.  Electronically Signed     DC/MEDQ  D:  11/29/2006  T:  11/29/2006  Job:  [id number]   cc:   Agnes Lawrence, M.D.  Fax: [place]  Fax: [phone]   Enedina Finner.  Fax: [phone]   Caswell Corwin, R.N.  [address], [place] [address]

## 2011-04-30 NOTE — Consult Note (Signed)
[name], LALLI NO.:  [id number]   MEDICAL RECORD NO.:  [id number]          PATIENT TYPE:  OUT   LOCATION:  GYN                          FACILITY:  [facility]   PHYSICIAN:  John T. Clarene Essex, M.D.    DATE OF BIRTH:  24-May-1946   DATE OF CONSULTATION:  01/10/2007  DATE OF DISCHARGE:                                 CONSULTATION   CHIEF COMPLAINT:  Followup of stage IB grade 1 endometrial  adenocarcinoma.   HISTORY OF PRESENT ILLNESS:  This patient underwent a TAH with BSO,  nodes, omental biopsy, and washings on 11/29/2006.  She had no evidence  of metastatic disease; and had 3 mm out of 11 mm myometrial invasion.  She has had an uncomplicated convalescence; and is returning to full  activity.  She has normalized bowel and bladder function; and denies leg  swelling.   PAST HISTORY:  Significant for breast cancer, seizure disorder, Crohn's  disease, post ileal resection, osteoporosis, B12 deficiency, and  hyperlipidemia.   MEDICATIONS INCLUDE:  Evista, Caltrate, vitamin D, Lipitor, Percocet  p.r.n. back pain, Dilantin, baby aspirin, vitamin B12 shots, and  potassium chloride.   ALLERGIES:  None known.   PHYSICAL EXAMINATION:  VITAL SIGNS:  Weight 108.5 pounds, blood pressure  105/72, and vital signs stable.  GENERAL: The patient is alert and oriented x3 in no acute distress.  She  has a well-healed incision with minimal tenderness.  There is no  ascites, mass or hernia.  EXTREMITIES:  Have full strength and range of motion without edema.  PELVIC:  External genitalia and BUS normal to inspection and palpation.  Bladder and urethra are normal with atrophic vaginal epithelium and  healing cuff.  Bimanual and rectovaginal examinations reveal postop  cuff, and absent uterus and cervix.   ASSESSMENT:  Stage IB grade 2 endometrial adenocarcinoma convalescing  from surgery.   PLAN:  The patient should be followed at 16-monthintervals for the first  year; and  subsequently at 611-monthntervals to complete 5 years of  followup for endometrial cancer.      John T. SoClarene EssexM.D.  Electronically Signed     JTS/MEDQ  D:  01/10/2007  T:  01/10/2007  Job:  [id number] cc:   Dr. LiExie ParodyFax: [phone] JoEnedina Finner Fax: [phone] NaCaswell CorwinR.N.  [address] [id number]

## 2011-04-30 NOTE — Consult Note (Signed)
NAMESUAN, [name] NO.:  [id number]   MEDICAL RECORD NO.:  [id number]          PATIENT TYPE:  OUT   LOCATION:  GYN                          FACILITY:  [facility]   PHYSICIAN:  John T. Clarene Essex, M.D.    DATE OF BIRTH:  [birth year]   DATE OF CONSULTATION:  11/02/2006  DATE OF DISCHARGE:                                 CONSULTATION   .   CHIEF COMPLAINT:  This patient is seen at the request of Dr. Abbe Amsterdam for management of endometrial adenocarcinoma.   HISTORY OF PRESENT ILLNESS:  The patient presented with postmenopausal  bleeding and small to  moderate size cystocele and rectocele.  She  appropriately underwent a fractional D&C on October 21, 2006, with  findings of well differentiated endometrioid adenocarcinoma involving  the cervical curettings and well to moderately differentiated  endometrioid adenocarcinoma involving endometrial curettings.  The  patient has had no of bleeding since her D&C.Marland Kitchen   PAST MEDICAL HISTORY:  1. The patient has a remote history of breast cancer treated with      radical local resection, chest wall radiation and chemotherapy      (likely Adriamycin) and has been followed without evidence of      recurrent disease.  2. She has seizure disorder with seizures x3 with the most recent in      February 2004.  3. She has Crohn's disease treated with exploration and resection of      distal ileum with ileocecal for ileal ascending anastomosis.  4. Osteoporosis with thoracic vertebral compression fractures.  5. Documented B12 deficiency on therapy.  6. History of the elevated liver enzymes.  7. She has had hyperlipidemia in the past.   MEDICATIONS:  Evista, Caltrate, vitamin D, Lipitor, Percocet p.r.n. back  pain, Dilantin 400 mg daily, baby aspirin, B12 shots and potassium  chloride .   ALLERGIES:  None known.   PERSONAL AND SOCIAL HISTORY:  The patient is married, denies tobacco or  ethanol.   FAMILY MEDICAL HISTORY:  She has  an aunt with breast cancer but no  breast or gynecologic malignancies.   REVIEW OF SYSTEMS:  Otherwise normal and negative in the remainder 10  systems exam.   PHYSICAL EXAMINATION:  VITAL SIGNS: Weight 118 pounds. Temperature  afebrile and vital signs stable as noted in the clinic flow sheet.  GENERAL: The patient is anxious, alert and oriented x3 in no acute  distress.  LYMPHADENOPATHY:  There is no pathologic lymphadenopathy.  HEENT: The ENT examination is negative with clear oropharynx, full  extraocular movements and no scleral icterus.  NECK:  Goiter is not enlarged.  CHEST: Lung fields clear to percussion and auscultation.  HEART:  Regular rate and rhythm with no JVD.  MUSCULOSKELETAL:  There is no back or CVA tenderness.  ABDOMEN:  Soft and benign with well-healed midline incision which  extends well into the epigastrium.  No ascites, mass or hernia.  No  abdominal tenderness.  EXTREMITIES: Full strength and range of motion with no edema, cords or  Homan's.  PELVIC:  External genitalia and BUS normal to inspection and  palpation.  Bladder and urethra well supported.  Vagina has is grade II cystocele  and rectocele.  Cervix mobile without lesions and has normal consistency  on bimanual examination.  Bimanual and rectovaginal examinations reveal  absent uterus and cervix, no mass or nodularity.  Uterus is retroverted  and upper limit size.   ASSESSMENT:  Grade I-II endometrioid adenocarcinoma.  Endocervical  curettings were positive, but this may represent drag artifact.   PLAN:  The patient is offered exploratory laparotomy with total  abdominal hysterectomy, BSO and comprehensive staging to include pelvic  and periaortic lymphadenectomy and washings.  If the patient had lower  uterine segment and cervical expansion at exploration, a more radical  parametrium resection would be performed.  Surgery will be scheduled for  the near future following an anesthesia evaluation.  I  answered multiple  questions posed by the patient and family, and they are in agreement  with this plan.      John T. Clarene Essex, M.D.  Electronically Signed     JTS/MEDQ  D:  11/02/2006  T:  11/02/2006  Job:  [id number]   cc:   Abbe Amsterdam  Fax: [phone]   Enedina Finner.  Fax: [phone]   Caswell Corwin, R.N.  [address], [place] [address]

## 2011-04-30 NOTE — Discharge Summary (Signed)
NAMELUCELY, LEARD NO.:  [id number]   MEDICAL RECORD NO.:  [id number]          PATIENT TYPE:  INP   LOCATION:  3887                         [facility]ACILITY:  [facility]   PHYSICIAN:  Agnes Lawrence, M.D.DATE OF BIRTH:  [birth year]   DATE OF ADMISSION:  11/29/2006  DATE OF DISCHARGE:  12/02/2006                               DISCHARGE SUMMARY   CHIEF COMPLAINT:  The patient is a 65 year old who presents for  operative management of an endometrial adenocarcinoma.  Please see the  dictated history and physical as per Dr. Cindie Laroche for further details.   HOSPITAL COURSE:  The patient was admitted and underwent a total  abdominal hysterectomy with bilateral salpingo-oophorectomy, pelvic  lymphadenectomy.  Please see the dictated operative summary.  On  postoperative day #1 her hematocrit was 34.7.  Her diet was gradually  advanced. The remainder of her hospital course was uneventful. She was  discharged home on postoperative day #3, tolerating a regular diet.   DISCHARGE DIAGNOSES:  Grade II endometrioid carcinoma, stage IB.   PROCEDURE:  Total abdominal hysterectomy, bilateral salpingo-  oophorectomy, pelvic lymphadenectomy.   CONDITION:  Good.   DIET:  Regular.   ACTIVITY:  Progressive activity, pelvic rest.   MEDICATIONS:  Percocet 1-2 tabs every 6 hours as needed.   DISPOSITION:  The patient is to followup in the GYN/Oncology office on  December 27th between 10:00 and 12:00 noon for staple removal.      Agnes Lawrence, M.D.  Electronically Signed     LAJ/MEDQ  D:  01/03/2007  T:  01/03/2007  Job:  [id number]   cc:   Caswell Corwin, R.N.  [address], [place] [address]   Abbe Amsterdam  Fax: [phone]   Sherral Hammers, M.D.

## 2011-05-12 ENCOUNTER — Ambulatory Visit: Payer: Medicare Other | Attending: Gynecologic Oncology | Admitting: Gynecologic Oncology

## 2011-05-12 ENCOUNTER — Other Ambulatory Visit: Payer: Self-pay | Admitting: Oncology

## 2011-05-12 ENCOUNTER — Other Ambulatory Visit: Payer: Self-pay | Admitting: Gynecologic Oncology

## 2011-05-12 ENCOUNTER — Encounter (HOSPITAL_BASED_OUTPATIENT_CLINIC_OR_DEPARTMENT_OTHER): Payer: Medicare Other | Admitting: Oncology

## 2011-05-12 DIAGNOSIS — Z853 Personal history of malignant neoplasm of breast: Secondary | ICD-10-CM | POA: Insufficient documentation

## 2011-05-12 DIAGNOSIS — C549 Malignant neoplasm of corpus uteri, unspecified: Secondary | ICD-10-CM

## 2011-05-12 DIAGNOSIS — D696 Thrombocytopenia, unspecified: Secondary | ICD-10-CM

## 2011-05-12 DIAGNOSIS — R21 Rash and other nonspecific skin eruption: Secondary | ICD-10-CM

## 2011-05-12 DIAGNOSIS — Z7982 Long term (current) use of aspirin: Secondary | ICD-10-CM | POA: Insufficient documentation

## 2011-05-12 DIAGNOSIS — Z79899 Other long term (current) drug therapy: Secondary | ICD-10-CM | POA: Insufficient documentation

## 2011-05-12 LAB — CBC WITH DIFFERENTIAL/PLATELET
Basophils Absolute: 0 10*3/uL (ref 0.0–0.1)
EOS%: 0.7 % (ref 0.0–7.0)
HCT: 33.1 % — ABNORMAL LOW (ref 34.8–46.6)
HGB: 11.4 g/dL — ABNORMAL LOW (ref 11.6–15.9)
MCH: 33.2 pg (ref 25.1–34.0)
MONO#: 0.2 10*3/uL (ref 0.1–0.9)
NEUT#: 3.8 10*3/uL (ref 1.5–6.5)
NEUT%: 72.7 % (ref 38.4–76.8)
RDW: 14 % (ref 11.2–14.5)
WBC: 5.3 10*3/uL (ref 3.9–10.3)
lymph#: 1.1 10*3/uL (ref 0.9–3.3)

## 2011-05-12 LAB — MORPHOLOGY: PLT EST: DECREASED

## 2011-05-13 ENCOUNTER — Other Ambulatory Visit (HOSPITAL_COMMUNITY)
Admission: RE | Admit: 2011-05-13 | Discharge: 2011-05-13 | Disposition: A | Discharge status: Home / Self Care | Payer: Medicare Other | Source: Ambulatory Visit | Attending: Gynecologic Oncology | Admitting: Gynecologic Oncology

## 2011-05-13 DIAGNOSIS — Z8540 Personal history of malignant neoplasm of unspecified female genital organ: Secondary | ICD-10-CM | POA: Insufficient documentation

## 2011-05-13 NOTE — Consult Note (Signed)
[name], [name] NO.:  [id number]  MEDICAL RECORD NO.:  [id number]           PATIENT TYPE:  O  LOCATION:  GYN                          FACILITY:  [facility]  PHYSICIAN:  Alaiah Lundy A. Alycia Rossetti, MD    DATE OF BIRTH:  1946/06/03  DATE OF CONSULTATION:  05/12/2011 DATE OF DISCHARGE:                                CONSULTATION   The patient is a very pleasant 65 soon to be 65 year old with a stage IB, grade 2 endometrial carcinoma, diagnosed in December 2007.  She had been followed since that time without any evidence of recurrent disease. I last saw her in December 2011 at which time her exam was negative. She also has a past medical history significant for breast cancer, diagnosed in 1990, T1 N0 treated with CMF chemotherapy local radiation and 5 years of tamoxifen.  She was last seen by Dr. Marko Plume earlier this year at which time her exam was unremarkable.  She comes in and is doing fairly well.  REVIEW OF SYSTEMS:  She has recovered well from her orthopedic fracture of her left arm.  She has a lot of stress in her family as her mother who is 72 has some cardiac disease and hospice has been called in as they do not think that she is going to survive her numerous ailments that have come up.  She states her mother really is just tired and she is staying away.  The patient herself denies any chest pain, shortness of breath, nausea, vomiting, fever, chills, headaches, visual changes, unintentional weight loss, weight gain, vaginal bleeding.  MEDICATIONS: 1. Dilantin 200 mg twice daily. 2. Baby aspirin 81 mg daily. 3. Klor-Con 40 mEq twice daily. 4. Zocor 10 mg daily. 5. Fluoxetine 60 mg daily. 6. Colestipol 10 g daily. 7. Folic acid 1 mg daily. 8. Mercaptopurine 50 mg daily.  HEALTH MAINTENANCE:  She is up-to-date on her mammograms.  PHYSICAL EXAMINATION:  VITAL SIGNS:  Weight 112 pounds.  She is stable. Blood pressure 98/60, pulse 70, respirations 16,  temperature 97.9. GENERAL:  A well-nourished, well-developed female, in no acute distress. NECK:  Supple.  There is no lymphadenopathy, no thyromegaly. LUNGS:  Clear to auscultation bilaterally. CARDIOVASCULAR:  Regular rate and rhythm. ABDOMEN:  A well-healed vertical midline incision.  Abdomen is soft, nontender, nondistended.  There are no palpable masses or hepatosplenomegaly.  There is no evidence of an incisional hernia. Groins are negative for adenopathy. EXTREMITIES:  She has no edema.  She has a well-healed surgical scar at the left elbow. PELVIC:  External genitalia is within normal limits.  The vagina is markedly atrophic.  It appears that she has bilateral paravaginal defects.  The vaginal cuff is visualized.  There are no visible lesions. ThinPrep Pap was submitted without difficulty.  Bimanual examination reveals no masses or nodularity.  Rectal confirms.  ASSESSMENT:  A 65 year old with a stage IB, grade 1 endometrioid adenocarcinoma, based on today's FIGO staging would be a stage IA, grade 1 endometrial carcinoma who has no evidence of recurrent disease.  PLAN: 1. We will follow up results for Pap smear, so the patient will return  to see [place] in 6 months. 2. She will follow up with a repeat CBC that Dr. Marko Plume orders     secondary to a slightly low white count and slightly low platelet     count on her labs from April. 3. She will see [place] in 6 months.  She knows that we will think of her     and her family regarding her mom's illnesses.     Stepanie Graver A. Alycia Rossetti, MD     PAG/MEDQ  D:  05/12/2011  T:  05/13/2011  Job:  [id number]  cc:   Caswell Corwin, R.N. [address], [place] [address]  Loralee Pacas. Sharlett Iles, MD, FACG, FACP, FAGA [address] [place] [address]  Gordy Levan, M.D. Fax: [phone]  Electronically Signed by Nancy Marus MD on 05/13/2011 09:07:38 AM

## 2011-07-09 ENCOUNTER — Telehealth: Payer: Self-pay | Admitting: Gastroenterology

## 2011-07-09 NOTE — Telephone Encounter (Signed)
Order faxed to [phone], pt aware

## 2011-08-03 ENCOUNTER — Encounter: Payer: Self-pay | Admitting: Gastroenterology

## 2011-08-03 ENCOUNTER — Ambulatory Visit (INDEPENDENT_AMBULATORY_CARE_PROVIDER_SITE_OTHER): Payer: Medicare Other | Admitting: Gastroenterology

## 2011-08-03 VITALS — BP 110/68 | HR 72 | Ht 61.0 in | Wt 114.2 lb

## 2011-08-03 DIAGNOSIS — D899 Disorder involving the immune mechanism, unspecified: Secondary | ICD-10-CM

## 2011-08-03 DIAGNOSIS — D849 Immunodeficiency, unspecified: Secondary | ICD-10-CM | POA: Insufficient documentation

## 2011-08-03 DIAGNOSIS — R1903 Right lower quadrant abdominal swelling, mass and lump: Secondary | ICD-10-CM

## 2011-08-03 DIAGNOSIS — K50112 Crohn's disease of large intestine with intestinal obstruction: Secondary | ICD-10-CM

## 2011-08-03 DIAGNOSIS — K5010 Crohn's disease of large intestine without complications: Secondary | ICD-10-CM

## 2011-08-03 MED ORDER — MERCAPTOPURINE 50 MG PO TABS: 50.0000 mg | ORAL_TABLET | Freq: Every day | ORAL | Status: DC

## 2011-08-03 NOTE — Patient Instructions (Addendum)
A rx for your 15m has been sent to your pharmacy for 559monce a day, but you should be taking .5 tablet by mouth each day.

## 2011-08-03 NOTE — Progress Notes (Signed)
This is a very nice 65 year old Caucasian female with chronic Crohn's disease, previous partial bowel resection, and known ileal stricture , and she is on 6-MP 25 mg a day has had good control of her symptomatology. However, she had an episode 2 days ago of right lower quadrant pain with nausea and vomiting that abated in 12 hours. She actually doing well otherwise and denies diarrhea, rectal bleeding, daily abdominal pain or systemic or hepatobiliary complaints. Her appetite is good and her weight is stable. He also is on Colestid 1 g a day because of postcholecystectomy diarrhea. Recent lab data including liver function tests and CBC were reviewed and were normal with white count 3700.   Current Medications, Allergies, Past Medical History, Past Surgical History, Family History and Social History were reviewed in [employer] record.  Pertinent Review of Systems Negative... her mother age 54 died one month ago, with result of stress and mild depression. He denies any psychotic type symptomatology.  Physical Exam: Awake and alert in no distress. Chest is clear cardiac exam is unremarkable. She has a firmness in the right lowerquadrant area which appears smaller than previous exams. It is nontender and freely movable. She does have some hyperactive bowel sounds noted. Her mental status is normal and peripheral extremities are unremarkable.    Assessment and Plan: Crohn's disease doing fairly well on low-dose immunosuppressive therapy. This patient has marked sensitivity to 6-MP and needs CBC and liver profile every 3 months. She has a documented stricture at her surgical anastomosis, and she generally follows a low fiber diet. If she has continued difficulty, we will repeat her CT enterography and consider surgical intervention. Otherwise, she is to continue 6-MP 25 mg a day with B12 replacement, daily Colestid, folic acid 1 mg a day. I will see her every 3-6 months as needed. She is  followed by Dr. Martie Lee In [place], [place]. No diagnosis found.

## 2011-09-02 ENCOUNTER — Telehealth: Payer: Self-pay | Admitting: Gastroenterology

## 2011-09-02 DIAGNOSIS — K5090 Crohn's disease, unspecified, without complications: Secondary | ICD-10-CM

## 2011-09-02 NOTE — Telephone Encounter (Addendum)
Pt was told to call and give updates on her condition. Pt with hx of Crohn's, partial bowel obstruction, known ileal stricture at anastomosis, post Cholecystectomy on 1 gram daily. Last OV 08/03/11 for abdominal pain and she remains on 6MP 12m daily with labs q3 months. She reports an episode of abdominal pain 2 weeks ago with cramping. She reports she  Only has the episodes when she is constipated and once she has a BM, she's ok. Pt stated you mentioned something for pain. She reports you spoke about it at the last visit, but ya'll started another conversation and she forgot. I saw nothing in her chart for prior med- narcotic or librax or mesalamine. Please advise.

## 2011-09-03 MED ORDER — TRAMADOL HCL 50 MG PO TABS: ORAL_TABLET | ORAL | Status: DC

## 2011-09-03 NOTE — Telephone Encounter (Signed)
Tramadol 50 mg every 6-8 hours should be fine #60 refill x5

## 2011-09-03 NOTE — Telephone Encounter (Signed)
Notified pt Dr Sharlett Iles ordered Tramadol for her- sent to [facility] in [facility]. Per last OV, pt needs CBC and Liver Profile q54month. Per pt request, we will fax the [facility] for early November draw; once labs are done, we will schedule her for OV.

## 2011-10-06 ENCOUNTER — Telehealth: Payer: Self-pay | Admitting: *Deleted

## 2011-10-06 NOTE — Telephone Encounter (Signed)
Notified pt I faxed her order for her labs to Dr Whitt's of[facility], fax [phone], ofc [phone]. Pt will go in early November.

## 2011-10-06 NOTE — Telephone Encounter (Signed)
Message copied by Lance Morin on Wed Oct 06, 2011  3:16 PM ------      Message from: Lance Morin      Created: Fri Sep 03, 2011  9:49 AM      Regarding: labs       Need to schedule cbc and liver profile for 10/15/11. Needs to go to Dr Quin Hoop ofc at [facility]. Then she need OV with dr Mamie Nick

## 2011-11-17 ENCOUNTER — Ambulatory Visit: Payer: Medicare Other | Attending: Gynecologic Oncology | Admitting: Gynecologic Oncology

## 2011-11-17 ENCOUNTER — Encounter: Payer: Self-pay | Admitting: Gynecologic Oncology

## 2011-11-17 VITALS — BP 96/64 | HR 64 | Temp 98.0°F | Resp 16 | Ht 63.11 in | Wt 117.8 lb

## 2011-11-17 DIAGNOSIS — Z7982 Long term (current) use of aspirin: Secondary | ICD-10-CM | POA: Insufficient documentation

## 2011-11-17 DIAGNOSIS — C549 Malignant neoplasm of corpus uteri, unspecified: Secondary | ICD-10-CM | POA: Insufficient documentation

## 2011-11-17 DIAGNOSIS — K5090 Crohn's disease, unspecified, without complications: Secondary | ICD-10-CM | POA: Insufficient documentation

## 2011-11-17 DIAGNOSIS — Z01419 Encounter for gynecological examination (general) (routine) without abnormal findings: Secondary | ICD-10-CM | POA: Insufficient documentation

## 2011-11-17 DIAGNOSIS — Z79899 Other long term (current) drug therapy: Secondary | ICD-10-CM | POA: Insufficient documentation

## 2011-11-17 DIAGNOSIS — M810 Age-related osteoporosis without current pathological fracture: Secondary | ICD-10-CM | POA: Insufficient documentation

## 2011-11-17 DIAGNOSIS — Z853 Personal history of malignant neoplasm of breast: Secondary | ICD-10-CM | POA: Insufficient documentation

## 2011-11-17 DIAGNOSIS — C541 Malignant neoplasm of endometrium: Secondary | ICD-10-CM

## 2011-11-17 NOTE — Patient Instructions (Signed)
Follow up with Dr. Kelton Pillar scheduled.

## 2011-11-17 NOTE — Progress Notes (Signed)
Consult Note: Gyn-Onc  [name] 65 y.o. female  CC:  Chief Complaint  Patient presents with  . 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 She's been seen regularly since that time with no evidence of recurrent disease. I last saw her in May 2012 at which time her exam was unremarkable as was her Pap smear. She also has a past medical history significant for breast cancer diagnosed in 1990. This was T1 N0 cancer treated with CMF chemotherapy and local radiation. She followed this with 5 years of tamoxifen. She sees Dr. Marko Plume for followup of this. The most notable issue is that since we last saw her her mother passed away. This was not unexpected that has been very difficult for her.  Interval History: As above.  Review of Systems: Her stress level has improved despite her mother's passing as she is no longer caring for her. She denies any chest pain, nausea, fevers chills, shortness of breath, headaches, visual changes, unintentional weight loss, she has gained weight, and has an excellent appetite. She denies any vaginal bleeding, change in her bowel or bladder habits. 10 point review of systems is otherwise negative.  Current Meds:  Outpatient Encounter Prescriptions as of 11/17/2011  Medication Sig Dispense Refill  . aspirin 81 MG tablet Take 81 mg by mouth daily.        . Calcium Carbonate (CALCARB 600) 1500 MG TABS Take 1 tablet by mouth 2 (two) times daily.        . Cholecalciferol (VITAMIN D) 1000 UNITS capsule Take 1,000 Units by mouth 2 (two) times daily.        . colestipol (COLESTID) 1 G tablet Take 1 g by mouth daily.        . cyanocobalamin (,VITAMIN B-12,) 1000 MCG/ML injection Inject 1,000 mcg into the muscle every 30 (thirty) days.        . folic acid (FOLVITE) 1 MG tablet TAKE ONE TABLET BY MOUTH EVERY DAY  30 tablet  6  . mercaptopurine (PURINETHOL) 50 MG tablet Take 1 tablet (50 mg total) by  mouth daily. Take one tablet by mouth every other day and take .5 every other day.  30 tablet  6  . phenytoin (DILANTIN) 100 MG ER capsule Take 200 mg by mouth 2 (two) times daily.        . potassium chloride SA (K-DUR,KLOR-CON) 20 MEQ tablet Take 40 mEq by mouth 2 (two) times daily.        . simvastatin (ZOCOR) 10 MG tablet Take 10 mg by mouth at bedtime.        . saccharomyces boulardii (FLORASTOR) 250 MG capsule Take 250 mg by mouth daily.        . traMADol (ULTRAM) 50 MG tablet Take one tablet by mouth every 6-8 hours as needed for pain.  60 tablet  5    Allergy:  Allergies  Allergen Reactions  . Penicillins   . Tetanus Toxoid     Social Hx:   History   Social History  . Marital Status: Married    Spouse Name: N/A    Number of Children: 2  . Years of Education: N/A   Occupational History  . Retired    Social History Main Topics  . Smoking status: Never Smoker   . Smokeless tobacco: Never Used  . Alcohol Use: No  . Drug Use: No  . Sexually Active:  Not on file   Other Topics Concern  . Not on file   Social History Narrative  . No narrative on file    Past Surgical Hx:  Past Surgical History  Procedure Date  . Colon resection   . Abdominal hysterectomy   . Cholecystectomy   . Breast lumpectomy   . Elbow surgery     left    Past Medical Hx:  Past Medical History  Diagnosis Date  . Personal history of malignant neoplasm of other parts of uterus   . Osteoporosis, unspecified   . Other and unspecified hyperlipidemia   . Other convulsions   . Malignant neoplasm of breast (female), unspecified site   . Malignant neoplasm of corpus uteri, except isthmus   . Abdominal pain, unspecified site   . Diarrhea   . Personal history of colonic polyps     hyperplastic  . Crohn's disease     Family Hx:  Family History  Problem Relation Age of Onset  . Breast cancer Maternal Aunt     aunt  . Heart disease Mother   . Aneurysm Father     Vitals:  Blood pressure  96/64, pulse 64, temperature 98 F (36.7 C), temperature source Oral, resp. rate 16, height 5' 3.11" (1.603 m), weight 117 lb 12.8 oz (53.434 kg).  Physical Exam: Well-nourished well-developed female in no acute distress.  Neck: No lymphadenopathy no thyromegaly.  Lungs: Clear to auscultation bilaterally. Cardiovascular: Regular rate and rhythm.  Abdomen: Soft, nontender, nondistended. Surgical incisions are well-healed. There is no palpable masses or hepatosplenomegaly.  Groins: No lymphadenopathy.  Extremities: No edema.  Pelvic: External genitalia is within normal limits. The vagina is markedly atrophic. Vaginal cuff is visualized. The ThinPrep Pap was made without difficulty. There's no masses or nodularity.  Rectal: No masses or nodularity.  Assessment/Plan: 65 year old with stage IA grade 2 endometrioid adenocarcinoma and clinically has no evidence of recurrent disease 5 years out from the time of diagnosis. We will followup on the results of your Pap smear from today. She would like to follow up with Dr. Palma Holter her primary physician does GYN care. She was be happy to see her in the future should the need arise.  Nancy Marus A., MD 11/17/2011, 4:43 PM

## 2011-11-24 ENCOUNTER — Other Ambulatory Visit (HOSPITAL_COMMUNITY)
Admission: RE | Admit: 2011-11-24 | Discharge: 2011-11-24 | Disposition: A | Discharge status: Home / Self Care | Payer: Medicare Other | Source: Ambulatory Visit | Attending: Gynecologic Oncology | Admitting: Gynecologic Oncology

## 2011-11-25 ENCOUNTER — Telehealth: Payer: Self-pay | Admitting: *Deleted

## 2011-11-26 NOTE — Telephone Encounter (Signed)
Notified patient that the pap smear that was taken on 11/17/2011 by Dr. Alycia Rossetti was normal.

## 2012-01-10 ENCOUNTER — Telehealth: Payer: Self-pay | Admitting: *Deleted

## 2012-01-10 NOTE — Telephone Encounter (Signed)
lmom for pt informing her to have her labs drawn at Dr Quin Hoop [facility] in February; she will also need a f/u in Feb. Or March with Dr Sharlett Iles. Lab request faxed to [phone]

## 2012-01-10 NOTE — Telephone Encounter (Signed)
Message copied by Lance Morin on Mon Jan 10, 2012  8:54 AM ------      Message from: Shella Maxim K      Created: Mon Oct 18, 2011  8:46 AM       Needs labs in early February-lfts, cbc

## 2012-01-13 NOTE — Telephone Encounter (Signed)
lmom for pt informing her I faxed an order to Dr Quin Hoop [facility] for labs; she may call back for questions.

## 2012-01-25 ENCOUNTER — Telehealth: Payer: Self-pay | Admitting: *Deleted

## 2012-01-25 NOTE — Telephone Encounter (Signed)
Pt is taking 21m 240mper day, she has an appt 03/07/2012. Do you just want to wait and discuss having any more labs when she comes for her OV?

## 2012-01-25 NOTE — Telephone Encounter (Signed)
Pt should be on 62m 211ma day per Dr PaSharlett Iles

## 2012-01-25 NOTE — Telephone Encounter (Signed)
Repeat cbc,diff, and liver enzymes in 1 month

## 2012-01-26 NOTE — Telephone Encounter (Signed)
Will get labs on 03/07/2012, when she comes for for ov

## 2012-02-08 ENCOUNTER — Other Ambulatory Visit: Payer: Self-pay | Admitting: Gastroenterology

## 2012-03-06 ENCOUNTER — Telehealth: Payer: Self-pay | Admitting: Gastroenterology

## 2012-03-06 DIAGNOSIS — K5090 Crohn's disease, unspecified, without complications: Secondary | ICD-10-CM

## 2012-03-06 NOTE — Telephone Encounter (Signed)
Pt fell and fractured her arm and would like to cx her appt and have her labs done in [place] at her regular MD i will fax order over for them to draw labs and fax results to Dr Sharlett Iles on 03/17/2012. She will call back to reschedule her office visit after her arm gets better some.

## 2012-03-06 NOTE — Telephone Encounter (Signed)
Left message for pt to call back  °

## 2012-03-06 NOTE — Telephone Encounter (Signed)
Addended by: Bernita Buffy D on: 03/06/2012 01:55 PM   Modules accepted: Orders

## 2012-03-07 ENCOUNTER — Ambulatory Visit: Payer: Medicare Other | Admitting: Gastroenterology

## 2012-03-09 ENCOUNTER — Telehealth: Payer: Self-pay | Admitting: Oncology

## 2012-03-09 NOTE — Telephone Encounter (Signed)
l/m with 04/04/12 appts   aom

## 2012-03-28 ENCOUNTER — Telehealth: Payer: Self-pay | Admitting: Oncology

## 2012-03-28 NOTE — Telephone Encounter (Signed)
Pt called left message, called pt back left message regarding appt on 04/04/12 that pt wants to reschedule

## 2012-03-28 NOTE — Telephone Encounter (Signed)
Pt called and requested appt to be r/s from 4/23 to 5/10 lab and MD.

## 2012-04-04 ENCOUNTER — Ambulatory Visit: Payer: Medicare Other | Admitting: Oncology

## 2012-04-04 ENCOUNTER — Other Ambulatory Visit: Payer: Medicare Other | Admitting: Lab

## 2012-04-05 ENCOUNTER — Ambulatory Visit: Payer: Medicare Other | Admitting: Gastroenterology

## 2012-04-10 ENCOUNTER — Encounter: Payer: Self-pay | Admitting: *Deleted

## 2012-04-13 ENCOUNTER — Ambulatory Visit (INDEPENDENT_AMBULATORY_CARE_PROVIDER_SITE_OTHER): Payer: Medicare Other | Admitting: Gastroenterology

## 2012-04-13 ENCOUNTER — Encounter: Payer: Self-pay | Admitting: Gastroenterology

## 2012-04-13 VITALS — BP 118/64 | HR 80 | Ht 62.0 in | Wt 113.0 lb

## 2012-04-13 DIAGNOSIS — K5080 Crohn's disease of both small and large intestine without complications: Secondary | ICD-10-CM

## 2012-04-13 DIAGNOSIS — K50812 Crohn's disease of both small and large intestine with intestinal obstruction: Secondary | ICD-10-CM

## 2012-04-13 MED ORDER — BUDESONIDE 3 MG PO CP24: ORAL_CAPSULE | ORAL | Status: DC

## 2012-04-13 NOTE — Progress Notes (Addendum)
This is a long-term patient of mine with Crohn's disease with previous right colon resection and chronic small bowel stenosis who had been doing well on 6 MP 50 mg a day. She was recently admitted to [facility] with small bowel obstruction which seem to respond to IV corticosteroids. She currently is on Entocort 9 mg a day with resolution of her abdominal pain and nausea and vomiting. Last colonoscopy was 2 years ago and showed small bowel disease but no significant anastomotic obstruction. She's been followed expertly by Dr. Martie Lee, and I reviewed her recent hospitalization with excellent progress notes an excellent workup. Patient currently is asymptomatic is having regular bowel movements without melena or hematochezia, denies nausea vomiting or any hepatobiliary complaints. She also has a seizure disorder, fractured her right arm, and was on antibiotics several months ago. Apparently she was checked for C. difficile with a negative exam.  Current Medications, Allergies, Past Medical History, Past Surgical History, Family History and Social History were reviewed in [employer] record.  Pertinent Review of Systems Negative   Physical Exam: Healthy-appearing patient in no distress. Blood pressure 118/64, pulse 80 and regular, and weight 113 pounds with BMI of 20.67. Her abdomen shows no organomegaly, masses other than her right lower quadrant movable thickening which has been present for many years. Otherwise there are no masses or tenderness, and bowel sounds are not obstructive in nature. Mental status is normal.    Assessment and Plan: Chronic stenosing Crohn's disease with previous right hemicolectomy. She recently had a flare while all 6 MP therapy. Currently she is doing well on Endoicort 9 mg a day. I have continued 9 mg a day dosage for 1 months, then will decrease to 6 mg a day for month with office followup at that time. I have suggested followup colonoscopy  exam which she has refused. She has been on 6-MP for several years, has had associated leukopenia, and I'm reluctant to reinstitute this medication without further evaluation. She is to call immediately should she have worsening of her condition. She is to continue on medications as listed and reviewed her records.   Please copy her primary care physician, referring physician, and pertinent subspecialists. No diagnosis found.

## 2012-04-13 NOTE — Patient Instructions (Signed)
We have sent the following medications to your pharmacy for you to pick up at your convenience: Entocort 3 mg-Take 3 capsules by mouth once daily x 1 month Then, decrease to Entocort 3 mg-2 capsules by mouth once daily x 1 month. Please discontinue your 6MP (mercaptopurine) Please follow up with Dr Sharlett Iles in 2 months.  Low Fiber and Residue Restricted Diet A low fiber diet restricts foods that contain carbohydrates that are not digested in the small intestine. A diet containing about 10 g of fiber is considered low fiber. The diet needs to be individualized to suit patient tolerances and preferences and to avoid unnecessary restrictions. Generally, the foods emphasized in a low fiber diet have no skins or seeds. They may have been processed to remove bran, germ, or husks. Cooking may not necessarily eliminate the fiber. Cooking may, in fact, enable a greater quantity of fiber to be consumed in a lesser volume. Legumes and nuts are also restricted. The term low residue has also been used to describe low fiber diets, although the two are not the same. Residue refers to any substance that adds to bowel (colonic) contents, such as sloughed cells and intestinal bacteria, in addition to fiber. Residue-containing foods, prunes and prune juice, milk, and connective tissue from meats may also need to be eliminated. It is important to eliminate these foods during sudden (acute) attacks of inflammatory bowel disease, when there is a partial obstruction due to another reason, or when minimal fecal output is desired. When these problems are gone, a more normal diet may be used. PURPOSE  Prevent blockage of a partially obstructed or narrowed gastrointestinal tract.   Reduce stool weight and volume.   Slow the movement of waste.  WHEN IS THIS DIET USED?  Acute phase of Crohn's disease, ulcerative colitis, regional enteritis, or diverticulitis.   Narrowing (stenosis) of intestinal or esophageal tubes  (lumina).   Transitional diet following surgery, injury (trauma), or illness.  ADEQUACY This diet is nutritionally adequate based on individual food choices according to the Recommended Dietary Allowances of the [employer]. CHOOSING FOODS Check labels, especially on foods from the starch list. Often, dietary fiber content is listed with the Nutrition Facts panel.  Breads and Starches  Allowed: White, [place], and pita breads, plain rolls, buns, or sweet rolls, doughnuts, waffles, pancakes, bagels. Plain muffins, sweet breads, biscuits, matzoth. Flour. Soda, saltine, or graham crackers. Pretzels, rusks, melba toast, zwieback. Cooked cereals: cornmeal, farina, cream cereals. Dry cereals: refined corn, wheat, rice, and oat cereals (check label). Potatoes prepared any way without skins, refined macaroni, spaghetti, noodles, refined rice.   Avoid: Bread, rolls, or crackers made with whole-wheat, multigrains, rye, bran seeds, nuts, or coconut. Corn tortillas, table-shells. Corn chips, tortilla chips. Cereals containing whole-grains, multigrains, bran, coconut, nuts, or raisins. Cooked or dry oatmeal. Coarse wheat cereals, granola. Cereals advertised as "high fiber." Potato skins. Whole-grain pasta, wild or brown rice. Popcorn.  Vegetables  Allowed:  Strained tomato and vegetable juices. Fresh: tender lettuce, cucumber, cabbage, spinach, bean sprouts. Cooked, canned: asparagus, bean sprouts, cut green or wax beans, cauliflower, pumpkin, beets, mushrooms, olives, spinach, yellow squash, tomato, tomato sauce (no seeds), zucchini (peeled), turnips. Canned sweet potatoes. Small amounts of celery, onion, radish, and green pepper may be used. Keep servings limited to  cup.   Avoid: Fresh, cooked, or canned: artichokes, baked beans, beet greens, broccoli, Brussels sprouts, French-style green beans, corn, kale, legumes, peas, sweet potatoes. Cooked: green or red cabbage, spinach. Avoid large servings  of any vegetables.  Fruit  Allowed:  All fruit juices except prune juice. Cooked or canned: apricots applesauce, cantaloupe, cherries, grapefruit, grapes, kiwi, mandarin oranges, peaches, pears, fruit cocktail, pineapple, plums, watermelon. Fresh: banana, grapes, cantaloupe, avocado, cherries, pineapple, grapefruit, kiwi, nectarines, peaches, oranges, blueberries, plums. Keep servings limited to  cup or 1 piece.   Avoid: Fresh: apple with or without skin, apricots, mango, pears, raspberries, strawberries. Prune juice, stewed or dried prunes. Dried fruits, raisins, dates. Avoid large servings of all fresh fruits.  Meat and Meat Substitutes  Allowed:  Ground or well-cooked tender beef, ham, veal, lamb, pork, or poultry. Eggs, plain cheese. Fish, oysters, shrimp, lobster, other seafood. Liver, organ meats.   Avoid: Tough, fibrous meats with gristle. Peanut butter, smooth or chunky. Cheese with seeds, nuts, or other foods not allowed. Nuts, seeds, legumes, dried peas, beans, lentils.  Milk  Allowed:  All milk products except those not allowed. Milk and milk product consumption should be minimal when low residue is desired.   Avoid: Yogurt that contains nuts or seeds.  Soups and Combination Foods  Allowed:  Bouillon, broth, or cream soups made from allowed foods. Any strained soup. Casseroles or mixed dishes made with allowed foods.   Avoid: Soups made from vegetables that are not allowed or that contain other foods not allowed.  Desserts and Sweets  Allowed:  Plain cakes and cookies, pie made with allowed fruit, pudding, custard, cream pie. Gelatin, fruit, ice, sherbet, frozen ice pops. Ice cream, ice milk without nuts. Plain hard candy, honey, jelly, molasses, syrup, sugar, chocolate syrup, gumdrops, marshmallows.   Avoid: Desserts, cookies, or candies that contain nuts, peanut butter, or dried fruits. Jams, preserves with seeds, marmalade.  Fats and Oils  Allowed:  Margarine, butter,  cream, mayonnaise, salad oils, plain salad dressings made from allowed foods. Plain gravy, crisp bacon without rind.   Avoid: Seeds, nuts, olives. Avocados.  Beverages  Allowed:  All, except those listed to avoid.   Avoid: Fruit juices with high pulp, prune juice.  Condiments  Allowed:  Ketchup, mustard, horseradish, vinegar, cream sauce, cheese sauce, cocoa powder. Spices in moderation: allspice, basil, bay leaves, celery powder or leaves, cinnamon, cumin powder, curry powder, ginger, mace, marjoram, onion or garlic powder, oregano, paprika, parsley flakes, ground pepper, rosemary, sage, savory, tarragon, thyme, turmeric.   Avoid: Coconut, pickles.  SAMPLE MEAL PLAN The following menu is provided as a sample. Your daily menu plans will vary. Be sure to include a minimum of the following each day in order to provide essential nutrients for the adult:  Starch/Bread/Cereal Group, 6 servings.   Fruit/Vegetable Group, 5 servings.   Meat/Meat Substitute Group, 2 servings.   Milk/Milk Substitute Group, 2 servings.  A serving is equal to  cup for fruits, vegetables, and cooked cereals or 1 piece for foods such as a piece of bread, 1 orange, or 1 apple. For dry cereals and crackers, use serving sizes listed on the label. Combination foods may count as full or partial servings from various food groups. Fats, desserts, and sweets may be added to the meal plan after the requirements for essential nutrients are met. SAMPLE MENU Breakfast   cup orange juice.   1 boiled egg.   1 slice white toast.   Margarine.    cup cornflakes.   1 cup milk.   Beverage.  Lunch   cup chicken noodle soup.   2 to 3 oz sliced roast beef.   2 slices seedless rye bread.  Mayonnaise.    cup tomato juice.   1 small banana.   Beverage.  Dinner  3 oz baked chicken.    cup scalloped potatoes.    cup cooked beets.   White dinner roll.   Margarine.    cup canned peaches.    Beverage.  Document Released: 05/21/2002 Document Revised: 11/18/2011 Document Reviewed: 11/01/2011 [facility] Patient Information 2012 [place].

## 2012-04-17 ENCOUNTER — Telehealth: Payer: Self-pay | Admitting: Oncology

## 2012-04-17 NOTE — Telephone Encounter (Signed)
Called pt, left message , regarding appt from 5/10th moved to 05/16/12 lab and Md

## 2012-04-21 ENCOUNTER — Other Ambulatory Visit: Payer: Medicare Other | Admitting: Lab

## 2012-04-21 ENCOUNTER — Ambulatory Visit: Payer: Medicare Other | Admitting: Oncology

## 2012-05-16 ENCOUNTER — Other Ambulatory Visit (HOSPITAL_BASED_OUTPATIENT_CLINIC_OR_DEPARTMENT_OTHER): Payer: Medicare Other | Admitting: Lab

## 2012-05-16 ENCOUNTER — Ambulatory Visit (HOSPITAL_BASED_OUTPATIENT_CLINIC_OR_DEPARTMENT_OTHER): Payer: Medicare Other | Admitting: Oncology

## 2012-05-16 ENCOUNTER — Encounter: Payer: Self-pay | Admitting: Oncology

## 2012-05-16 ENCOUNTER — Telehealth: Payer: Self-pay | Admitting: Oncology

## 2012-05-16 VITALS — BP 130/81 | HR 76 | Temp 97.7°F | Ht 62.0 in | Wt 114.3 lb

## 2012-05-16 DIAGNOSIS — Z853 Personal history of malignant neoplasm of breast: Secondary | ICD-10-CM

## 2012-05-16 DIAGNOSIS — C50919 Malignant neoplasm of unspecified site of unspecified female breast: Secondary | ICD-10-CM

## 2012-05-16 DIAGNOSIS — D696 Thrombocytopenia, unspecified: Secondary | ICD-10-CM

## 2012-05-16 DIAGNOSIS — C549 Malignant neoplasm of corpus uteri, unspecified: Secondary | ICD-10-CM

## 2012-05-16 DIAGNOSIS — Z8542 Personal history of malignant neoplasm of other parts of uterus: Secondary | ICD-10-CM

## 2012-05-16 DIAGNOSIS — R21 Rash and other nonspecific skin eruption: Secondary | ICD-10-CM

## 2012-05-16 LAB — CBC WITH DIFFERENTIAL/PLATELET
BASO%: 0.2 % (ref 0.0–2.0)
Basophils Absolute: 0 10e3/uL (ref 0.0–0.1)
EOS%: 0.5 % (ref 0.0–7.0)
Eosinophils Absolute: 0 10e3/uL (ref 0.0–0.5)
HCT: 35.2 % (ref 34.8–46.6)
HGB: 12 g/dL (ref 11.6–15.9)
LYMPH%: 29.3 % (ref 14.0–49.7)
MCH: 31.9 pg (ref 25.1–34.0)
MCHC: 34.1 g/dL (ref 31.5–36.0)
MCV: 93.6 fL (ref 79.5–101.0)
MONO#: 0.3 10e3/uL (ref 0.1–0.9)
MONO%: 5.8 % (ref 0.0–14.0)
NEUT#: 2.8 10e3/uL (ref 1.5–6.5)
NEUT%: 64.2 % (ref 38.4–76.8)
Platelets: 135 10e3/uL — ABNORMAL LOW (ref 145–400)
RBC: 3.76 10e6/uL (ref 3.70–5.45)
RDW: 13.8 % (ref 11.2–14.5)
WBC: 4.3 10e3/uL (ref 3.9–10.3)
lymph#: 1.3 10e3/uL (ref 0.9–3.3)
nRBC: 0 % (ref 0–0)

## 2012-05-16 NOTE — Patient Instructions (Signed)
Call if needed prior to return appointment in 1 year. Dr Kelton Pillar to order mammograms Dec.

## 2012-05-16 NOTE — Telephone Encounter (Signed)
gv pt appt schedule for June 2014

## 2012-05-16 NOTE — Progress Notes (Signed)
OFFICE PROGRESS NOTE Date of Visit 05-16-12 Physicians: Martie Lee, Verl Blalock, (P.Gehrig)  INTERVAL HISTORY:  Patient is seen, alone for visit, in scheduled yearly follow up of her history of right breast cancer and subsequent endometrial cancer. Primary care is by Dr Martie Lee and she is followed for Crohn's disease by Dr Sharlett Iles; she is no longer followed regularly by gyn oncology. History is of T1N0 right breast cancerin 1990, treated with CMF chemotherapy, local radiation and 5 years of tamoxifen. She has mammograms in [place] and is up to date on these with no findings of concern per patient, tho I have not received these reports. She also has history of IB grade 1 endometrial carcinoma, with surgery by Dr Clarene Essex Dec 2007 and no adjuvant therapy. She was followed regularly by gyn oncology until 5 years out from diagnosis.  Patient had seizures 9 years ago, then had had none until ~ 3 months ago when she had seizure in bed which was observed by husband; she sustained a minimal fracture of right humerus during the seizure. Patient tells me that she had not been entirely compliant with seizure medication prior to this. She was admitted to hospital in [facility], was also found to have a bladder infection which was treated, then had flare of Crohn's possible related to the antibiotic. She also had difficult IV access in LUE (does not use RUE for IV access with breast cancer history) and subsequently had superficial thrombophlebitis in LUE. She has seen Dr Sharlett Iles for the Crohn's flare and has follow up with him in July. She had dilantin dose increased and is now on full dose ASA x 3 mo then will be back on baby ASA daily.  On Review of Systems otherwise: no fever or other symptoms of infection. No further seizures or other neurologic symptoms. No respiratory symptoms, no bleeding, no chest pain, appetite generally good. Crohn's symptoms improved. No present symptoms of UTI. Some pain still  right upper arm. No swelling LE.No changes on breast self exam. No other abdominal or pelvic symptoms. Remainder of 10 point Review of Systems negative.   Objective:  Vital signs in last 24 hours:  BP 130/81  Pulse 76  Temp(Src) 97.7 F (36.5 C) (Oral)  Ht 5' 2"  (1.575 m)  Wt 51.846 kg (114 lb 4.8 oz)  BMI 20.91 kg/m2   HEENT:mucous membranes moist, pharynx normal without lesions. Normal hair pattern. PERRL, not icteric LymphaticsCervical, supraclavicular, and axillary nodes normal.No inguinal adenopathy Resp: clear to auscultation bilaterally and normal percussion bilaterally Cardio: regular rate and rhythm GI: soft, non-tender; bowel sounds normal; no masses,  no organomegaly Extremities: LE no pitting edema, cords or tenderness. No swelling RUE. LUE with superficial slightly tender cords each ~ 2 cm at antecubital area medially, with minimal erythema and no heat, no swelling of LUE otherwise Neuro:speech fluent and appropriate, CN intact, easily ambulatory and mobile, otherwise nonfocal Right breast with well-healed lumpectomy, no dominant mass, no skin or nipple findings, nothing in right axilla. Left breast without findings of concern and axilla benign. Skin without petechiae. Bruising medial left ankle from known trauma. Lab Results:   Basename 05/16/12 1133  WBC 4.3  HGB 12.0  HCT 35.2  PLT 135*   ANC 2.8 Morphology otherwise not remarkable BMET   Studies/Results:  No results found.  Medications: I have reviewed the patient's current medications.  Assessment/Plan:  1. Remote stage 1 right breast cancer post treatment as above including systemic chemotherapy: no recurrent disease. Continue  yearly mammograms which her PCP orders 2.history of stage 1B grade 1 endometrial cancer, history as above. No known active disease 3.minimal thrombocytopenia since at least 2010 by my records. Will follow 4.Crohn's disease, followed by GI 5.seizure disorder on  dilantin 6.recent LUE fracture, improved   Will continue yearly visits at this office as patient requests.     [name] P, MD   05/16/2012, 4:05 PM

## 2012-06-13 ENCOUNTER — Encounter: Payer: Self-pay | Admitting: Gastroenterology

## 2012-06-13 ENCOUNTER — Ambulatory Visit (INDEPENDENT_AMBULATORY_CARE_PROVIDER_SITE_OTHER): Payer: Medicare Other | Admitting: Gastroenterology

## 2012-06-13 VITALS — BP 110/60 | HR 72 | Ht 62.0 in | Wt 113.0 lb

## 2012-06-13 DIAGNOSIS — K5000 Crohn's disease of small intestine without complications: Secondary | ICD-10-CM

## 2012-06-13 DIAGNOSIS — K56609 Unspecified intestinal obstruction, unspecified as to partial versus complete obstruction: Secondary | ICD-10-CM

## 2012-06-13 DIAGNOSIS — Z9049 Acquired absence of other specified parts of digestive tract: Secondary | ICD-10-CM

## 2012-06-13 DIAGNOSIS — K56699 Other intestinal obstruction unspecified as to partial versus complete obstruction: Secondary | ICD-10-CM

## 2012-06-13 DIAGNOSIS — K529 Noninfective gastroenteritis and colitis, unspecified: Secondary | ICD-10-CM

## 2012-06-13 DIAGNOSIS — E538 Deficiency of other specified B group vitamins: Secondary | ICD-10-CM

## 2012-06-13 DIAGNOSIS — Z9889 Other specified postprocedural states: Secondary | ICD-10-CM

## 2012-06-13 DIAGNOSIS — R197 Diarrhea, unspecified: Secondary | ICD-10-CM

## 2012-06-13 MED ORDER — CHOLESTYRAMINE LIGHT 4 G PO PACK: 4.0000 g | PACK | Freq: Two times a day (BID) | ORAL | Status: DC

## 2012-06-13 NOTE — Patient Instructions (Addendum)
Decrease your Entocort to once daily I have sent in your prescription to your pharmacy Use your Questran with juice daily two hours away from your other medications

## 2012-06-13 NOTE — Progress Notes (Signed)
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 She is currently asymptomatic, is keeping her weight steady, and denies abdominal pain but does occasionally have nonbloody diarrhea. She is afraid to take Colestid because of the pill size. There is no fever, chills, skin rashes, arthritis, mouth sores or other systemic complaints. She is followed closely by physicians in [place], [place] and also Dr. Marko Plume in Oncology. She's had previous surgery for endometrial carcinoma. The patient had previously been on 6-MP, but this was discontinued because of leukopenia. She had CT scan enteroscopy 2 years ago that was reviewed today. Review of recent lab data shows a normal CBC.  Current Medications, Allergies, Past Medical History, Past Surgical History, Family History and Social History were reviewed in [employer] record.  Pertinent Review of Systems Negative   Physical Exam: Blood pressure 110/60, pulse 72, weight 113 pounds with BMI 20.67. She is a healthy-appearing patient in no acute distress. Her abdomen shows no distention organomegaly. She has a firm mass in the right lower quadrant which is nontender and not fluctuant. This is been present for several years and has not really changed in size. Bowel sounds are nonobstructive. Rectal exam was deferred. Mental status is normal.    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 It seems that most of this is chronic scarring and not related to acute inflammation at this time. We will continue to taper her Entocort as tolerated. If she has a relapse I would suggest repeat CT enterography and surgical consultation. It has been 15  years since her last gastrointestinal surgery. I have stopped her Colestid and we will let her try when necessary Questran as tolerated apart from her other medications. She will continue to see me every 6 months or when necessary as needed.  Please copy Dr. Marko Plume in oncology and Dr. Martie Lee In [place].  No diagnosis found.

## 2012-06-14 ENCOUNTER — Other Ambulatory Visit: Payer: Self-pay | Admitting: Gastroenterology

## 2013-02-07 ENCOUNTER — Other Ambulatory Visit: Payer: Self-pay | Admitting: Gastroenterology

## 2013-05-16 ENCOUNTER — Ambulatory Visit (HOSPITAL_BASED_OUTPATIENT_CLINIC_OR_DEPARTMENT_OTHER): Payer: Medicare Other | Admitting: Oncology

## 2013-05-16 ENCOUNTER — Encounter: Payer: Self-pay | Admitting: Oncology

## 2013-05-16 ENCOUNTER — Other Ambulatory Visit (HOSPITAL_BASED_OUTPATIENT_CLINIC_OR_DEPARTMENT_OTHER): Payer: Medicare Other | Admitting: Lab

## 2013-05-16 VITALS — BP 123/65 | HR 83 | Temp 97.8°F | Resp 18 | Ht 62.0 in | Wt 108.2 lb

## 2013-05-16 DIAGNOSIS — Z853 Personal history of malignant neoplasm of breast: Secondary | ICD-10-CM

## 2013-05-16 DIAGNOSIS — C50919 Malignant neoplasm of unspecified site of unspecified female breast: Secondary | ICD-10-CM

## 2013-05-16 DIAGNOSIS — D649 Anemia, unspecified: Secondary | ICD-10-CM

## 2013-05-16 DIAGNOSIS — Z8542 Personal history of malignant neoplasm of other parts of uterus: Secondary | ICD-10-CM

## 2013-05-16 LAB — CBC WITH DIFFERENTIAL/PLATELET
BASO%: 0.6 % (ref 0.0–2.0)
Basophils Absolute: 0 10e3/uL (ref 0.0–0.1)
EOS%: 0.7 % (ref 0.0–7.0)
Eosinophils Absolute: 0 10e3/uL (ref 0.0–0.5)
HCT: 33.5 % — ABNORMAL LOW (ref 34.8–46.6)
HGB: 11.1 g/dL — ABNORMAL LOW (ref 11.6–15.9)
LYMPH%: 31.2 % (ref 14.0–49.7)
MCH: 31.8 pg (ref 25.1–34.0)
MCHC: 33.3 g/dL (ref 31.5–36.0)
MCV: 95.5 fL (ref 79.5–101.0)
MONO#: 0.1 10e3/uL (ref 0.1–0.9)
MONO%: 4.7 % (ref 0.0–14.0)
NEUT#: 2 10e3/uL (ref 1.5–6.5)
NEUT%: 62.8 % (ref 38.4–76.8)
Platelets: 153 10e3/uL (ref 145–400)
RBC: 3.51 10e6/uL — ABNORMAL LOW (ref 3.70–5.45)
RDW: 14.8 % — ABNORMAL HIGH (ref 11.2–14.5)
WBC: 3.1 10e3/uL — ABNORMAL LOW (ref 3.9–10.3)
lymph#: 1 10e3/uL (ref 0.9–3.3)

## 2013-05-16 NOTE — Progress Notes (Signed)
OFFICE PROGRESS NOTE   05/16/2013   Physicians: [facility]), Verl Blalock, [facility])  INTERVAL HISTORY:   Patient is seen, together with husband, in scheduled yearly follow up of history of breast cancer, and subsequent ovarian cancer. She has had no known active cancer since those treatments were completed. Primary care is by Martie Lee at [facility], whom she saw earlier this month, q 6 month visits. She is followed for Crohn's disease by Dr Sharlett Iles.  ONCOLOGIC HISTORY History is of T1N0 right breast cancerin 1990, treated with CMF chemotherapy, local radiation and 5 years of tamoxifen. She has mammograms in [place], last in Dec 2013. She also has history of IB grade 1 endometrial carcinoma, with surgery by Dr Clarene Essex Dec 2007 and no adjuvant therapy. She was followed regularly by gyn oncology until 5 years out from diagnosis, now on prn follow up with gyn onc.  Review of Systems Feeling generally well, enjoying 41 month old grandson (other grands 12,10,5). No illness thru winter or recently. Good appetite and energy. Bowels presently doing well, now on low fiber diet per GI. No pain. No respiratory symptoms. No bleeding. No swelling UE. No changes on breast self exam. Had bone density scan this year, with second yearly IV bisphosphonate given. No seizures.No recent changes in any medications Remainder of 10 point Review of Systems negative.  Husband Herbie Baltimore has retired.  Objective:  Vital signs in last 24 hours:  BP 123/65  Pulse 83  Temp(Src) 97.8 F (36.6 C) (Oral)  Resp 18  Ht 5' 2"  (1.575 m)  Wt 108 lb 3.2 oz (49.079 kg)  BMI 19.78 kg/m2  Weight is down 6 lbs from a year ago. Easily ambulatory, looks comfortable.  HEENT:PERRLA, sclera clear, anicteric and oropharynx clear, no lesions. Normal hair pattern LymphaticsCervical, supraclavicular, and axillary nodes normal. Resp: clear to auscultation bilaterally Cardio: regular rate  and rhythm GI: soft, non-tender; bowel sounds normal; no masses,  no organomegaly Extremities: extremities normal, atraumatic, no cyanosis or edema Neuro:nonfocal Breasts: left without dominant mass, skin or nipple findings, axilla benign, no swelling LUE. Right breast with well healed changes from lumpectomy and radiation, no dominant mass, no skin or nipple findings. Skin with resolving bruise right lower leg.  Lab Results:  Results for orders placed in visit on 05/16/13  CBC WITH DIFFERENTIAL      Result Value Range   WBC 3.1 (*) 3.9 - 10.3 10e3/uL   NEUT# 2.0  1.5 - 6.5 10e3/uL   HGB 11.1 (*) 11.6 - 15.9 g/dL   HCT 33.5 (*) 34.8 - 46.6 %   Platelets 153  145 - 400 10e3/uL   MCV 95.5  79.5 - 101.0 fL   MCH 31.8  25.1 - 34.0 pg   MCHC 33.3  31.5 - 36.0 g/dL   RBC 3.51 (*) 3.70 - 5.45 10e6/uL   RDW 14.8 (*) 11.2 - 14.5 %   lymph# 1.0  0.9 - 3.3 10e3/uL   MONO# 0.1  0.1 - 0.9 10e3/uL   Eosinophils Absolute 0.0  0.0 - 0.5 10e3/uL   Basophils Absolute 0.0  0.0 - 0.1 10e3/uL   NEUT% 62.8  38.4 - 76.8 %   LYMPH% 31.2  14.0 - 49.7 %   MONO% 4.7  0.0 - 14.0 %   EOS% 0.7  0.0 - 7.0 %   BASO% 0.6  0.0 - 2.0 %    WBC and Hgb in this range back at least 2 years, and  platelets improved today.  Studies/Results: Mammograms and bone density done thru outside system.  Medications: I have reviewed the patient's current medications, including dilantin, calcium/D and B12.  Assessment/Plan: 1. Remote stage 1 right breast cancer post treatment as above including systemic chemotherapy: no recurrent disease. Continue yearly mammograms which her PCP orders. Follow up at this office prn. 2.history of stage 1B grade 1 endometrial cancer, history as above. No known active disease.  3.previous slight thrombocytopenia improved today. Other counts stable including slight anemia. Suggest CBC with diff by PCP at least yearly  4.Crohn's disease, followed regularly by GI  5.seizure disorder on  dilantin      Brendia Dampier P, MD   05/16/2013, 12:27 PM

## 2013-05-16 NOTE — Patient Instructions (Signed)
Return prn

## 2013-06-19 ENCOUNTER — Ambulatory Visit (INDEPENDENT_AMBULATORY_CARE_PROVIDER_SITE_OTHER): Payer: Medicare Other | Admitting: Gastroenterology

## 2013-06-19 ENCOUNTER — Encounter: Payer: Self-pay | Admitting: Gastroenterology

## 2013-06-19 ENCOUNTER — Other Ambulatory Visit (INDEPENDENT_AMBULATORY_CARE_PROVIDER_SITE_OTHER): Payer: Medicare Other

## 2013-06-19 VITALS — BP 120/74 | HR 74 | Ht 61.0 in | Wt 107.1 lb

## 2013-06-19 DIAGNOSIS — Z8542 Personal history of malignant neoplasm of other parts of uterus: Secondary | ICD-10-CM

## 2013-06-19 DIAGNOSIS — K5090 Crohn's disease, unspecified, without complications: Secondary | ICD-10-CM

## 2013-06-19 DIAGNOSIS — Z853 Personal history of malignant neoplasm of breast: Secondary | ICD-10-CM

## 2013-06-19 DIAGNOSIS — Z9049 Acquired absence of other specified parts of digestive tract: Secondary | ICD-10-CM

## 2013-06-19 DIAGNOSIS — K624 Stenosis of anus and rectum: Secondary | ICD-10-CM

## 2013-06-19 DIAGNOSIS — Z9089 Acquired absence of other organs: Secondary | ICD-10-CM

## 2013-06-19 DIAGNOSIS — Z9889 Other specified postprocedural states: Secondary | ICD-10-CM

## 2013-06-19 DIAGNOSIS — R1031 Right lower quadrant pain: Secondary | ICD-10-CM

## 2013-06-19 LAB — HEPATIC FUNCTION PANEL
ALT: 38 U/L — ABNORMAL HIGH (ref 0–35)
AST: 33 U/L (ref 0–37)
Albumin: 3.9 g/dL (ref 3.5–5.2)
Alkaline Phosphatase: 97 U/L (ref 39–117)
Total Protein: 6.9 g/dL (ref 6.0–8.3)

## 2013-06-19 LAB — BASIC METABOLIC PANEL
BUN: 7 mg/dL (ref 6–23)
Creatinine, Ser: 1 mg/dL (ref 0.4–1.2)
GFR: 59.11 mL/min — ABNORMAL LOW (ref >60.00)
Glucose, Bld: 102 mg/dL — ABNORMAL HIGH (ref 70–99)

## 2013-06-19 LAB — CBC WITH DIFFERENTIAL/PLATELET
Basophils Relative: 0.1 % (ref 0.0–3.0)
Eosinophils Absolute: 0 10*3/uL (ref 0.0–0.7)
Eosinophils Relative: 1.4 % (ref 0.0–5.0)
HCT: 35.2 % — ABNORMAL LOW (ref 36.0–46.0)
Hemoglobin: 11.9 g/dL — ABNORMAL LOW (ref 12.0–15.0)
MCHC: 33.7 g/dL (ref 30.0–36.0)
MCV: 94.8 fl (ref 78.0–100.0)
Monocytes Absolute: 0.2 10*3/uL (ref 0.1–1.0)
Neutro Abs: 2.2 10*3/uL (ref 1.4–7.7)
RBC: 3.72 Mil/uL — ABNORMAL LOW (ref 3.87–5.11)
WBC: 3.4 10*3/uL — ABNORMAL LOW (ref 4.5–10.5)

## 2013-06-19 LAB — IBC PANEL
Saturation Ratios: 34.5 % (ref 20.0–50.0)
Transferrin: 286.1 mg/dL (ref 212.0–360.0)

## 2013-06-19 LAB — TSH: TSH: 2.57 u[IU]/mL (ref 0.35–5.50)

## 2013-06-19 LAB — VITAMIN B12: Vitamin B-12: 452 pg/mL (ref 211–911)

## 2013-06-19 MED ORDER — BUDESONIDE 3 MG PO CP24: 9.0000 mg | ORAL_CAPSULE | ORAL | Status: DC

## 2013-06-19 NOTE — Patient Instructions (Addendum)
  You have been scheduled for a CT Enterography at [facility] on 06-22-2013 at 11 am. Liquids only four hours prior to appointment.  Please follow up with Dr. Sharlett Iles in two weeks.  Information on Low Fiber Diet given.  We have sent the following medications to your pharmacy for you to pick up at your convenience: Entocort 3 mg, please take 9 mg once daily.  Your physician has requested that you go to the basement for the following lab work before leaving today: BMP CBC TSH Hepatic Function Panel  Anemia Panel CRP Sedimentation Rate __________________________________________________________________________________________________________________                                               We are excited to introduce MyChart, a new best-in-class service that provides you online access to important information in your electronic medical record. We want to make it easier for you to view your health information - all in one secure location - when and where you need it. We expect MyChart will enhance the quality of care and service we provide.  When you register for MyChart, you can:    View your test results.    Request appointments and receive appointment reminders via email.    Request medication renewals.    View your medical history, allergies, medications and immunizations.    Communicate with your physician's office through a password-protected site.    Conveniently print information such as your medication lists.  To find out if MyChart is right for you, please talk to a member of our clinical staff today. We will gladly answer your questions about this free health and wellness tool.  If you are age 71 or older and want a member of your family to have access to your record, you must provide written consent by completing a proxy form available at our office. Please speak to our clinical staff about guidelines regarding accounts for patients younger than age  38.  As you activate your MyChart account and need any technical assistance, please call the MyChart technical support line at [phone]-CHART [phone]) or email your question to [email or web address]. If you email your question(s), please include your name, a return phone number and the best time to reach you.  If you have non-urgent health-related questions, you can send a message to our office through [place] at [email or web address]. If you have a medical emergency, call 911.  Thank you for using MyChart as your new health and wellness resource!   MyChart licensed from [employer],  1999-2010. Patents Pending.

## 2013-06-19 NOTE — Progress Notes (Signed)
This is a very pleasant 67 year old Caucasian female status post mastectomy for breast cancer, hysterectomy because of uterine cancer, cholecystectomy for gallstones, and right hemicolectomy because of Crohn's disease.  She's had recurrent inflammatory bowel disease for several years, and her last CT enteroscopy was in August of 2011.  She's had evidence of recurrent stricture of her small bowel, in the past as had an inflammatory palpable mass the right lower quadrant with good response previously to 6-MP therapy which was limited however because of leukopenia.  For the last year, she been off of all IBD medication had been doing well till one month ago when she developed some right lower quadrant discomfort, 2 days of nausea vomiting, constipation, but subsequently has felt better with soft bowel movements, but some continued mild discomfort in the right lower quadrant and rectal area.  Patient has known rectal stenosis from previous colonoscopy several years ago.  She has not been on recent antibiotics, NSAID, or other new medications.  She denies fever, chills, upper GI or hepatobiliary or other general medical problems.  CBC from one month ago showed mild leukopenia with a white count of 3100 and a hemoglobin of 11.1.  Current Medications, Allergies, Past Medical History, Past Surgical History, Family History and Social History were reviewed in [employer] record.  ROS: All systems were reviewed and are negative unless otherwise stated in the HPI.          Physical Exam: Healthy-appearing patient in no acute distress.  Blood pressure 120/74, pulse 74, and weight 107 with a BMI of 20.25.  I cannot appreciate stigmata of chronic liver disease.  Her chest is clear and she is in a regular rhythm without murmurs gallops or rubs.  There is no organomegaly, she does have some tenderness in the right lower quadrant with some slight fullness but no rebound.  Bowel sounds are  nonobstructive and there is no succussion splash noted.  Rectal exam shows rectal stenosis with dilatation as best as possible per digital exam.  There is soft liquidy greenish stool present which is guaiac negative.  Mental status is normal and peripheral extremities are unremarkable.    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  She has not had previous radiation therapy from her endometrial cancer, and she is followed closely by gynecologic oncology.  I have not seen this patient in the last year, and she apparently had been doing well until the last month.  Have set her up for followup CT enterography, we will check CBC, metabolic profile, sedimentation rate and CRP.  I have restarted Entocort 9 mg a day since she's had previous good response to this medication with flares of her disease.  With her leukopenia, again, I am reluctant to treat with any immunosuppressants or biological medications.  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  Have asked to follow a low residue diet if possible.  Please copy her primary care physician Dr. Martie Lee in [place].  Otherwise I'll see this patient back in 2 weeks' time, but if she worsens, she is to call, and may need hospitalization. No diagnosis found.

## 2013-06-22 ENCOUNTER — Telehealth: Payer: Self-pay | Admitting: *Deleted

## 2013-06-22 ENCOUNTER — Encounter (HOSPITAL_COMMUNITY): Payer: Self-pay

## 2013-06-22 ENCOUNTER — Ambulatory Visit (HOSPITAL_COMMUNITY)
Admission: RE | Admit: 2013-06-22 | Discharge: 2013-06-22 | Disposition: A | Discharge status: Home / Self Care | Payer: Medicare Other | Source: Ambulatory Visit | Attending: Gastroenterology | Admitting: Gastroenterology

## 2013-06-22 DIAGNOSIS — M4120 Other idiopathic scoliosis, site unspecified: Secondary | ICD-10-CM | POA: Insufficient documentation

## 2013-06-22 DIAGNOSIS — K5090 Crohn's disease, unspecified, without complications: Secondary | ICD-10-CM | POA: Insufficient documentation

## 2013-06-22 DIAGNOSIS — N289 Disorder of kidney and ureter, unspecified: Secondary | ICD-10-CM | POA: Insufficient documentation

## 2013-06-22 DIAGNOSIS — K838 Other specified diseases of biliary tract: Secondary | ICD-10-CM | POA: Insufficient documentation

## 2013-06-22 DIAGNOSIS — Z8542 Personal history of malignant neoplasm of other parts of uterus: Secondary | ICD-10-CM | POA: Insufficient documentation

## 2013-06-22 DIAGNOSIS — Z853 Personal history of malignant neoplasm of breast: Secondary | ICD-10-CM | POA: Insufficient documentation

## 2013-06-22 MED ORDER — IOHEXOL 300 MG/ML  SOLN
100.0000 mL | Freq: Once | INTRAMUSCULAR | Status: AC | PRN
  Administered 2013-06-22: 100 mL via INTRAVENOUS

## 2013-06-22 NOTE — Telephone Encounter (Signed)
Message copied by Lance Morin on Fri Jun 22, 2013  4:05 PM ------      Message from: PATTERSON, DAVID R      Created: Fri Jun 22, 2013  3:54 PM       Actually she needs followup endoscopy and colonoscopy.  Have her continue her Entocort in the meantime.  I will need to make a decision about biological therapy in her case.  If you could also add Nexium 40 mg a day to her regime. ------

## 2013-06-25 MED ORDER — ESOMEPRAZOLE MAGNESIUM 40 MG PO CPDR: 40.0000 mg | DELAYED_RELEASE_CAPSULE | Freq: Every day | ORAL | Status: DC

## 2013-06-25 NOTE — Telephone Encounter (Signed)
Informed pt of the need for ECL; she has had COLONs before, but doesn't understand whys she needs the upper and what it is. I tried explaining over the phone, but pt is having problems comprehending what I'm trying to tell her. I have info that I am sending her, but for now, she will keep the appt with Dr Sharlett Iles for 07/09/13 ; she will call for problems. She will continue the Entocort and will start Nexium.

## 2013-07-09 ENCOUNTER — Encounter: Payer: Self-pay | Admitting: Gastroenterology

## 2013-07-09 ENCOUNTER — Ambulatory Visit (INDEPENDENT_AMBULATORY_CARE_PROVIDER_SITE_OTHER): Payer: Medicare Other | Admitting: Gastroenterology

## 2013-07-09 VITALS — BP 102/62 | HR 88 | Ht 61.0 in | Wt 106.4 lb

## 2013-07-09 DIAGNOSIS — R197 Diarrhea, unspecified: Secondary | ICD-10-CM

## 2013-07-09 DIAGNOSIS — K5090 Crohn's disease, unspecified, without complications: Secondary | ICD-10-CM

## 2013-07-09 DIAGNOSIS — R1031 Right lower quadrant pain: Secondary | ICD-10-CM

## 2013-07-09 DIAGNOSIS — Z9889 Other specified postprocedural states: Secondary | ICD-10-CM

## 2013-07-09 DIAGNOSIS — Z9089 Acquired absence of other organs: Secondary | ICD-10-CM

## 2013-07-09 DIAGNOSIS — K219 Gastro-esophageal reflux disease without esophagitis: Secondary | ICD-10-CM

## 2013-07-09 DIAGNOSIS — Z9049 Acquired absence of other specified parts of digestive tract: Secondary | ICD-10-CM

## 2013-07-09 MED ORDER — HYDROCODONE-ACETAMINOPHEN 5-325 MG PO TABS: 1.0000 | ORAL_TABLET | Freq: Four times a day (QID) | ORAL | Status: DC | PRN

## 2013-07-09 MED ORDER — MOVIPREP 100 G PO SOLR: 1.0000 | Freq: Once | ORAL | Status: DC

## 2013-07-09 NOTE — Patient Instructions (Signed)
  You have been scheduled for an endoscopy and colonoscopy with propofol. Please follow the written instructions given to you at your visit today. Please pick up your prep at the pharmacy within the next 1-3 days. If you use inhalers (even only as needed), please bring them with you on the day of your procedure. Your physician has requested that you go to www.startemmi.com and enter the access code given to you at your visit today. This web site gives a general overview about your procedure. However, you should still follow specific instructions given to you by our office regarding your preparation for the procedure.  We have faxed the following medications to your pharmacy for you to pick up at your convenience: Hydrocodone 5-325, please take as directed ________________________________________________________________                                               We are excited to introduce MyChart, a new best-in-class service that provides you online access to important information in your electronic medical record. We want to make it easier for you to view your health information - all in one secure location - when and where you need it. We expect MyChart will enhance the quality of care and service we provide.  When you register for MyChart, you can:    View your test results.    Request appointments and receive appointment reminders via email.    Request medication renewals.    View your medical history, allergies, medications and immunizations.    Communicate with your physician's office through a password-protected site.    Conveniently print information such as your medication lists.  To find out if MyChart is right for you, please talk to a member of our clinical staff today. We will gladly answer your questions about this free health and wellness tool.  If you are age 69 or older and want a member of your family to have access to your record, you must provide written consent by  completing a proxy form available at our office. Please speak to our clinical staff about guidelines regarding accounts for patients younger than age 69.  As you activate your MyChart account and need any technical assistance, please call the MyChart technical support line at [phone]-CHART [phone]) or email your question to [email or web address]. If you email your question(s), please include your name, a return phone number and the best time to reach you.  If you have non-urgent health-related questions, you can send a message to our office through [place] at [email or web address]. If you have a medical emergency, call 911.  Thank you for using MyChart as your new health and wellness resource!   MyChart licensed from [employer],  1999-2010. Patents Pending.

## 2013-07-09 NOTE — Progress Notes (Addendum)
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  I've been following her now for a few years, and she is been on 6-MP and conventional doses with good response, and this medication was discontinued a year ago when she is in remission and had mild leukopenia..  However, over the last several months she's had recurrence of right lower quadrant intermittent pain with intermittent nausea and vomiting.  These episodes occur once every 2-3 weeks, but she does have associated chronic low-grade diarrhea without rectal bleeding, fever, chills, or other systemic complaints.  She also denies acid reflux, dyspepsia, dysphagia, anorexia or weight loss.  Recent CT enterography was obtained and showed what appeared to be Crohn's disease in the duodenum, jejunum, and also the ileum.  Surprisingly, her blood work all was relatively good for sedimentation rate of 16 a normal CRP.  She's been on Entocort 9 mg a day and really has had no significant change in her symptomatology.  She's had previous surgery for both breast and uterine cancer.  She has not had abdominal radiation.  Her WBC count is 3400 with normal differential.  She is not on 6-MP at this time.  The patient has had previous cholecystectomy.  Current Medications, Allergies, Past Medical History, Past Surgical History, Family History and Social History were reviewed in [employer] record.  ROS: All systems were reviewed and are negative unless otherwise stated in the HPI.      Allergies  Allergen Reactions  . Penicillins   . Tetanus Toxoid    Outpatient Prescriptions Prior to Visit  Medication Sig Dispense Refill  . aspirin 81 MG tablet Take 81 mg by mouth daily.      . budesonide (ENTOCORT EC) 3 MG 24 hr capsule Take 3 capsules (9 mg total) by mouth every morning.  90 capsule  2  . Calcium Carbonate (CALCARB 600) 1500 MG  TABS Take 1 tablet by mouth 2 (two) times daily.        . Cholecalciferol (VITAMIN D) 1000 UNITS capsule Take 1,000 Units by mouth 2 (two) times daily.        . cyanocobalamin (,VITAMIN B-12,) 1000 MCG/ML injection Inject 1,000 mcg into the muscle every 30 (thirty) days.        Marland Kitchen esomeprazole (NEXIUM) 40 MG capsule Take 1 capsule (40 mg total) by mouth daily before breakfast.  30 capsule  6  . phenytoin (DILANTIN) 100 MG ER capsule Take 200 mg by mouth 3 (three) times daily.       . potassium chloride SA (K-DUR,KLOR-CON) 20 MEQ tablet Take 40 mEq by mouth 2 (two) times daily.        No facility-administered medications prior to visit.   Past Medical History  Diagnosis Date  . Personal history of malignant neoplasm of other parts of uterus   . Osteoporosis, unspecified   . Other and unspecified hyperlipidemia   . Other convulsions   . Malignant neoplasm of breast (female), unspecified site   . Malignant neoplasm of corpus uteri, except isthmus   . Abdominal pain, unspecified site   . Diarrhea   . Personal history of colonic polyps     hyperplastic  . Crohn's disease   . IBD (inflammatory bowel disease)    Past Surgical History  Procedure Laterality Date  . Colon resection    . Abdominal hysterectomy    . Cholecystectomy    . Breast lumpectomy    .  Elbow surgery      left   History   Social History  . Marital Status: Married    Spouse Name: N/A    Number of Children: 2  . Years of Education: N/A   Occupational History  . Retired    Social History Main Topics  . Smoking status: Never Smoker   . Smokeless tobacco: Never Used  . Alcohol Use: No  . Drug Use: No  . Sexually Active: None   Other Topics Concern  . None   Social History Narrative  . None   Family History  Problem Relation Age of Onset  . Breast cancer Maternal Aunt   . Heart disease Mother   . Aneurysm Father   . Colon cancer Neg Hx          Physical Exam: Blood pressure 102/62, pulse 88,  weight 106, and BMI 20.11.  Cannot appreciate stigmata of chronic liver disease.  Her chest is clear she is in a regular rhythm without murmurs gallops or rubs.  Her abdomen shows no distention, organomegaly, and she does have a small movable mass the right lower quadrant which is smaller than on previous exams.  There is no real tenderness, distention, or abnormal bowel sounds.  No status is normal.    Assessment and Plan: I am somewhat surprised by the CT enterography findings suggest active mucosa disease in the upper gut.  I was more inclined to think that this patient has small bowel stricturing from her Crohn's disease, and she appears to have intermittent" attacks" of right lower quadrant pain with nausea and vomiting suggesting an anastomotic stricture.  Her last colonoscopy was over 3 years ago, and I decided to repeat her endoscopy and colonoscopy and make a decision as to whether or not this patient needs biological therapy with Remicade or Humira.  We have started Nexium 40 mg daily and will continue Entocort 9 mg a day pending her endoscopic evaluations.  Generally, she does best on low fiber low residue diet.   Please copy her primary care physician, referring physician, and pertinent subspecialists.

## 2013-07-16 ENCOUNTER — Ambulatory Visit (AMBULATORY_SURGERY_CENTER): Payer: Medicare Other | Admitting: Gastroenterology

## 2013-07-16 ENCOUNTER — Encounter: Payer: Self-pay | Admitting: Gastroenterology

## 2013-07-16 VITALS — BP 155/79 | HR 84 | Temp 98.4°F | Resp 22 | Ht 61.0 in | Wt 107.0 lb

## 2013-07-16 DIAGNOSIS — K5010 Crohn's disease of large intestine without complications: Secondary | ICD-10-CM

## 2013-07-16 DIAGNOSIS — K2990 Gastroduodenitis, unspecified, without bleeding: Secondary | ICD-10-CM

## 2013-07-16 DIAGNOSIS — R109 Unspecified abdominal pain: Secondary | ICD-10-CM

## 2013-07-16 DIAGNOSIS — K5090 Crohn's disease, unspecified, without complications: Secondary | ICD-10-CM

## 2013-07-16 DIAGNOSIS — K2970 Gastritis, unspecified, without bleeding: Secondary | ICD-10-CM

## 2013-07-16 DIAGNOSIS — R1031 Right lower quadrant pain: Secondary | ICD-10-CM

## 2013-07-16 DIAGNOSIS — K219 Gastro-esophageal reflux disease without esophagitis: Secondary | ICD-10-CM

## 2013-07-16 DIAGNOSIS — D1330 Benign neoplasm of unspecified part of small intestine: Secondary | ICD-10-CM

## 2013-07-16 DIAGNOSIS — Z1211 Encounter for screening for malignant neoplasm of colon: Secondary | ICD-10-CM

## 2013-07-16 DIAGNOSIS — K50112 Crohn's disease of large intestine with intestinal obstruction: Secondary | ICD-10-CM

## 2013-07-16 MED ORDER — SODIUM CHLORIDE 0.9 % IV SOLN: 500.0000 mL | INTRAVENOUS | Status: DC

## 2013-07-16 NOTE — Progress Notes (Signed)
Called to room to assist during endoscopic procedure.  Patient ID and intended procedure confirmed with present staff. Received instructions for my participation in the procedure from the performing physician.  

## 2013-07-16 NOTE — Progress Notes (Signed)
Patient did not experience any of the following events: a burn prior to discharge; a fall within the facility; wrong site/side/patient/procedure/implant event; or a hospital transfer or hospital admission upon discharge from the facility. (G8907) Patient did not have preoperative order for IV antibiotic SSI prophylaxis. (G8918)  

## 2013-07-16 NOTE — Op Note (Signed)
[place]  [address]. [place], [address]   COLONOSCOPY PROCEDURE REPORT  PATIENT: [name], [name]  MR#: [id number] BIRTHDATE: 11/18/1946 , 67  yrs. old GENDER: Female ENDOSCOPIST: Sable Feil, MD, [facility] REFERRED BY: PROCEDURE DATE:  07/16/2013 PROCEDURE:   Colonoscopy with biopsy First Screening Colonoscopy - Avg.  risk and is 50 yrs.  old or older - No.  Prior Negative Screening - Now for repeat screening. N/A  History of Adenoma - Now for follow-up colonoscopy & has been > or = to 3 yrs.  N/A ASA CLASS:   Class III INDICATIONS:High risk patient with previously diagnosed Crohn's Disease. MEDICATIONS: propofol (Diprivan) 134m IV  DESCRIPTION OF PROCEDURE:   After the risks benefits and alternatives of the procedure were thoroughly explained, informed consent was obtained.  A digital rectal exam revealed no abnormalities of the rectum.   The LB [id number] endoscope was introduced through the anus and advanced to the surgical anastomosis. No adverse events experienced.   The quality of the prep was excellent, using MoviPrep  The instrument was then slowly withdrawn as the colon was fully examined.      COLON FINDINGS: There was evidence of a prior ileocolonic surgical anastomosis.There did not appear to be active disease at the ileocolonic anastomosis.  I was was only able to advance colonoscope a short distance into the ileum. Withdrawal of colonoscope throughout the length the colon otherwise was unremarkable without mucosal or polypoid lesions Biopsies were obtained for review.  Retroflexion was not performed. The time to cecum=  .  Withdrawal time=  .  The scope was withdrawn and the procedure completed. COMPLICATIONS: There were no complications.  ENDOSCOPIC IMPRESSION: There was evidence of a prior ileocolonic surgical anastomosis..Marland Kitcheno evidence of stricturing or active Crohn's disease at the anastomosis.  Because of surgical  changes I was unable to advance the colonoscope more than a few centimeters into the small intestine.  RECOMMENDATIONS: 1.  Await biopsy results 2.  Upper endoscopy today. 3..  continue current medications     eSigned:  DSable Feil MD, FBapt[facility]07/16/2013 3:04 PMted cc:   cc. Dr.Donna WKelton Pillar

## 2013-07-16 NOTE — Op Note (Signed)
[place]  [address]. [place], [address]   ENDOSCOPY PROCEDURE REPORT  PATIENT: Nicole, Calderon  MR#: [id number] BIRTHDATE: [birth year] , 67  yrs. old GENDER: Female ENDOSCOPIST:Milanya Sunderland Consuello Masse, MD, [facility] REFERRED BY: PROCEDURE DATE:  07/16/2013 PROCEDURE:   EGD w/ biopsy for H.pylori ASA CLASS:    Class III INDICATIONS: Epigastric pain and history of Crohn's disease with recent CT enterography suggesting upper bowel involvement.Marland Kitchen MEDICATION: There was residual sedation effect present from prior procedure and propofol (Diprivan) 129m IV TOPICAL ANESTHETIC:  DESCRIPTION OF PROCEDURE:   After the risks and benefits of the procedure were explained, informed consent was obtained.  The LB [id number] endoscope was introduced through the mouth  and advanced to the third portion of the duodenum .  The instrument was slowly withdrawn as the mucosa was fully examined.      DUODENUM: We were ableto advance the endoscope into the third portion of the duodenum.  There was no evidence of active inflammatory bowel disease or other mucosal or nodular lesions.  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  There was a small hiatal hernia noted on exam.  CLO Bx. done for H.pylori exam.  ESOPHAGUS: The mucosa of the esophagus appeared normal. Retroflexed views revealed a hiatal hernia.    The scope was then withdrawn from the patient and the procedure completed.  COMPLICATIONS: There were no complications.   ENDOSCOPIC IMPRESSION: 1.   We were ableto advance the endoscope into the third portion of the duodenum.  There was no evidence of active inflammatory bowel disease or other mucosal or nodular lesions 2.   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   There was a small hiatal hernia noted on exam. 3.   The mucosa of the esophagus appeared normal  RECOMMENDATIONS: 1.  Await biopsy results 2.  Continue PPI 3 . This patient has no evidence of active inflammatory bowel disease I think most of her symptoms are related to ?? stricturing of her small intestine from her previous surgical procedures.  She is status post surgery for uterine carcinoma and for inflammatory bowel disease.  There is little evidence of active inflammatory bowel disease in the upper or lower gut at this time.  If she continues to have nausea and vomiting we will seek surgical consultation.    _______________________________  CC. DR> DMartie Leein SStrasburgand DR. DMarti Sleighin GGarlandOncology eSigned:  DSable Feil MD, F[facility]08/03/2013 3:13 PM   standard discharge   PATIENT NAME:  Nicole, BrauMR#: [id number]

## 2013-07-16 NOTE — Patient Instructions (Addendum)
Discharge instructions given with verbal understanding. Biopsies taken. Resume previous medications. YOU HAD AN ENDOSCOPIC PROCEDURE TODAY AT [facility]: Refer to the procedure report that was given to you for any specific questions about what was found during the examination.  If the procedure report does not answer your questions, please call your gastroenterologist to clarify.  If you requested that your care partner not be given the details of your procedure findings, then the procedure report has been included in a sealed envelope for you to review at your convenience later.  YOU SHOULD EXPECT: Some feelings of bloating in the abdomen. Passage of more gas than usual.  Walking can help get rid of the air that was put into your GI tract during the procedure and reduce the bloating. If you had a lower endoscopy (such as a colonoscopy or flexible sigmoidoscopy) you may notice spotting of blood in your stool or on the toilet paper. If you underwent a bowel prep for your procedure, then you may not have a normal bowel movement for a few days.  DIET: Your first meal following the procedure should be a light meal and then it is ok to progress to your normal diet.  A half-sandwich or bowl of soup is an example of a good first meal.  Heavy or fried foods are harder to digest and may make you feel nauseous or bloated.  Likewise meals heavy in dairy and vegetables can cause extra gas to form and this can also increase the bloating.  Drink plenty of fluids but you should avoid alcoholic beverages for 24 hours.  ACTIVITY: Your care partner should take you home directly after the procedure.  You should plan to take it easy, moving slowly for the rest of the day.  You can resume normal activity the day after the procedure however you should NOT DRIVE or use heavy machinery for 24 hours (because of the sedation medicines used during the test).    SYMPTOMS TO REPORT IMMEDIATELY: A gastroenterologist  can be reached at any hour.  During normal business hours, 8:30 AM to 5:00 PM Monday through Friday, call [phone].  After hours and on weekends, please call the GI answering service at [phone] who will take a message and have the physician on call contact you.   Following lower endoscopy (colonoscopy or flexible sigmoidoscopy):  Excessive amounts of blood in the stool  Significant tenderness or worsening of abdominal pains  Swelling of the abdomen that is new, acute  Fever of 100F or higher  Following upper endoscopy (EGD)  Vomiting of blood or coffee ground material  New chest pain or pain under the shoulder blades  Painful or persistently difficult swallowing  New shortness of breath  Fever of 100F or higher  Black, tarry-looking stools  FOLLOW UP: If any biopsies were taken you will be contacted by phone or by letter within the next 1-3 weeks.  Call your gastroenterologist if you have not heard about the biopsies in 3 weeks.  Our staff will call the home number listed on your records the next business day following your procedure to check on you and address any questions or concerns that you may have at that time regarding the information given to you following your procedure. This is a courtesy call and so if there is no answer at the home number and we have not heard from you through the emergency physician on call, we will assume that you have returned to your  regular daily activities without incident.  SIGNATURES/CONFIDENTIALITY: You and/or your care partner have signed paperwork which will be entered into your electronic medical record.  These signatures attest to the fact that that the information above on your After Visit Summary has been reviewed and is understood.  Full responsibility of the confidentiality of this discharge information lies with you and/or your care-partner.

## 2013-07-16 NOTE — Progress Notes (Signed)
PT. Unable to have sticks on right arm. Pt. Left arm noted with bruising to lower and upper left arm she stated that she was in the emergency room on Friday and they were unable to obtain an I.V. And they caused the bruising to her arm. Dr. Atilano Median spoke with pt. On Sunday and told her that it was o.k. To have procedures today.

## 2013-07-17 ENCOUNTER — Telehealth: Payer: Self-pay

## 2013-07-17 LAB — HELICOBACTER PYLORI SCREEN-BIOPSY: UREASE: NEGATIVE

## 2013-07-17 NOTE — Telephone Encounter (Signed)
  Follow up Call-  Call back number 07/16/2013  Post procedure Call Back phone  # [phone]  Permission to leave phone message Yes     Patient questions:  Do you have a fever, pain , or abdominal swelling? no Pain Score  0 *  Have you tolerated food without any problems? yes  Have you been able to return to your normal activities? yes  Do you have any questions about your discharge instructions: Diet   no Medications  no Follow up visit  no  Do you have questions or concerns about your Care? no  Actions: * If pain score is 4 or above: No action needed, pain <4.

## 2013-07-18 ENCOUNTER — Other Ambulatory Visit: Payer: Self-pay

## 2013-07-18 ENCOUNTER — Encounter: Payer: Self-pay | Admitting: Gastroenterology

## 2013-07-20 ENCOUNTER — Encounter: Payer: Self-pay | Admitting: Gastroenterology

## 2013-10-18 ENCOUNTER — Other Ambulatory Visit: Payer: Self-pay

## 2014-04-17 ENCOUNTER — Encounter: Payer: Self-pay | Admitting: Neurology

## 2014-04-17 ENCOUNTER — Ambulatory Visit (INDEPENDENT_AMBULATORY_CARE_PROVIDER_SITE_OTHER): Payer: Medicare Other | Admitting: Neurology

## 2014-04-17 VITALS — BP 126/66 | HR 75 | Temp 97.5°F | Ht 62.0 in | Wt 104.3 lb

## 2014-04-17 DIAGNOSIS — R569 Unspecified convulsions: Secondary | ICD-10-CM

## 2014-04-17 NOTE — Patient Instructions (Addendum)
1. Continue Dilantin 151m: 2 tabs twice a day 2. Schedule routine EEG 3. We will obtain bloodwork done through your PCP and if needed, additional labs will be ordered  Seizure precautions: 1. If medication has been prescribed for you to prevent seizures, take it exactly as directed.  Do not stop taking the medicine without talking to your doctor first, even if you have not had a seizure in a long time.   2. Avoid activities in which a seizure would cause danger to yourself or to others.  Don't operate dangerous machinery, swim alone, or climb in high or dangerous places, such as on ladders, roofs, or girders.  Do not drive unless your doctor says you may.  3. If you have any warning that you may have a seizure, lay down in a safe place where you can't hurt yourself.    4.  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  Maintain good sleep hygiene.  6.  Contact your doctor if you have any problems that may be related to the medicine you are taking.  7.  Call 911 and bring the patient back to the ED if:        A.  The seizure lasts longer than 5 minutes.       B.  The patient doesn't awaken shortly after the seizure  C.  The patient has new problems such as difficulty seeing, speaking or moving  D.  The patient was injured during the seizure  E.  The patient has a temperature over 102 F (39C)  F.  The patient vomited and now is having trouble breathing

## 2014-04-17 NOTE — Progress Notes (Addendum)
NEUROLOGY CONSULTATION NOTE  [name] MRN: [id number] DOB: 02-Mar-1946  Referring provider: Estrella Deeds, NP Primary care provider: Estrella Deeds, NP  Reason for consult:  Establish care for seizures  Thank you for your kind referral of [name] for consultation of the above symptoms. Although her history is well known to you, please allow me to reiterate it for the purpose of our medical record. The patient was accompanied to the clinic by her husband who also provides collateral information.  Records and images were personally reviewed where available.  HISTORY OF PRESENT ILLNESS: This is a pleasant 68 year old right-handed woman with a history of Crohn's disease, uterine cancer, and breast cancer s/p lumpectomy, chemo and radiation therapy 5 years ago, who started having seizures at age 24 or 58.  She recalls going to the bathroom then back to bed early morning, then her next recollection was being brought to the hospital.  Her husband heard her scream out followed by whole body stiffening and shaking lasting 2-3 minutes, with associated tongue bite and urinary incontinence.  She was brought to the hospital and not started on medication.  Six months after, she had a second convulsion out of sleep with similar screaming out and body stiffening and shaking.  She was again brought to the hospital, and per patient, not started on medication.  A month later, she had a third nocturnal convulsion and saw her PCP who started her on Dilantin 450m/day.  She may or may not have seen a neurologist, she is unsure, but does not recall having an EEG done.  She recalls being told that she had UTIs when she had the seizures.  She was seizure-free for 9 years until 2013 when she forgot to take her medication and had another nocturnal convulsion where she fractured her right arm.  Dilantin dose was increased to 6049mday.  After a few months, she started having dizziness and difficulty ambulating.   She went to the ER where he Dilantin level was supratherapeutic and she was instructed to stop the medication for a few days then restart at 20064mID.  She has been doing well since with no seizures and no further side effects.  She and her husband deny any staring/unresponsive episodes, no gaps in time, olfactory/gustatory hallucinations, deja vu, rising epigastric sensation, focal numbness/tingling/weakness, myoclonic jerks. She denies any dizziness, diplopia, gait instability, gum problems.  She had a DEXA scan in 11/2012 and has a diagnosis of osteoporosis.  She denies any headaches, dysarthria, dysphagia, neck/back pain, bowel/bladder dysfunction.  No chest pain or shortness of breath.  She is driving.  Epilepsy Risk Factors:  Her maternal aunt had seizures, otherwise she had a normal birth and early development.  There is no history of febrile convulsions, CNS infections such as meningitis/encephalitis, significant traumatic brain injury, neurosurgical procedures.  Prior AEDs: none Laboratory Data: per notes from 03/2014, last Dilantin level was 8.8.  Patient had bloodwork done last week, results unavailable for review. EEGs: none Imaging:  Head CT without contrast done 07/13/2013 reported as unremarkable.  PAST MEDICAL HISTORY: Past Medical History  Diagnosis Date  . Personal history of malignant neoplasm of other parts of uterus   . Osteoporosis, unspecified   . Other and unspecified hyperlipidemia   . Other convulsions   . Malignant neoplasm of breast (female), unspecified site   . Malignant neoplasm of corpus uteri, except isthmus   . Abdominal pain, unspecified site   . Diarrhea   . Personal  history of colonic polyps     hyperplastic  . Crohn's disease   . IBD (inflammatory bowel disease)     PAST SURGICAL HISTORY: Past Surgical History  Procedure Laterality Date  . Colon resection    . Abdominal hysterectomy    . Cholecystectomy    . Breast lumpectomy    . Elbow surgery        left    MEDICATIONS: Current Outpatient Prescriptions on File Prior to Visit  Medication Sig Dispense Refill  . aspirin 81 MG tablet Take 81 mg by mouth daily.      . Calcium Carbonate (CALCARB 600) 1500 MG TABS Take 1 tablet by mouth 2 (two) times daily.        . Cholecalciferol (VITAMIN D) 1000 UNITS capsule Take 1,000 Units by mouth 2 (two) times daily.        . cyanocobalamin (,VITAMIN B-12,) 1000 MCG/ML injection Inject 1,000 mcg into the muscle every 30 (thirty) days.        . phenytoin (DILANTIN) 100 MG ER capsule Take 100 mg by mouth. 2 tabs twice a day      . potassium chloride SA (K-DUR,KLOR-CON) 20 MEQ tablet Take 40 mEq by mouth 2 (two) times daily.        No current facility-administered medications on file prior to visit.    ALLERGIES: Allergies  Allergen Reactions  . Penicillins   . Tetanus Toxoid     FAMILY HISTORY: Family History  Problem Relation Age of Onset  . Breast cancer Maternal Aunt   . Heart disease Mother   . Aneurysm Father   . Colon cancer Neg Hx     SOCIAL HISTORY: History   Social History  . Marital Status: Married    Spouse Name: N/A    Number of Children: 2  . Years of Education: N/A   Occupational History  . Retired    Social History Main Topics  . Smoking status: Never Smoker   . Smokeless tobacco: Never Used  . Alcohol Use: No  . Drug Use: No  . Sexual Activity: Not on file   Other Topics Concern  . Not on file   Social History Narrative  . No narrative on file    REVIEW OF SYSTEMS: Constitutional: No fevers, chills, or sweats, no generalized fatigue, change in appetite Eyes: No visual changes, double vision, eye pain Ear, nose and throat: No hearing loss, ear pain, nasal congestion, sore throat Cardiovascular: No chest pain, palpitations Respiratory:  No shortness of breath at rest or with exertion, wheezes GastrointestinaI: No nausea, vomiting, diarrhea, abdominal pain, fecal incontinence Genitourinary:  No  dysuria, urinary retention or frequency Musculoskeletal:  No neck pain, back pain Integumentary: No rash, pruritus, skin lesions Neurological: as above Psychiatric: No depression, insomnia, anxiety Endocrine: No palpitations, fatigue, diaphoresis, mood swings, change in appetite, change in weight, increased thirst Hematologic/Lymphatic:  No anemia, purpura, petechiae. Allergic/Immunologic: no itchy/runny eyes, nasal congestion, recent allergic reactions, rashes  PHYSICAL EXAM: Filed Vitals:   04/17/14 0947  BP: 126/66  Pulse: 75  Temp: 97.5 F (36.4 C)   General: No acute distress Head:  Normocephalic/atraumatic Neck: supple, no paraspinal tenderness, full range of motion Back: No paraspinal tenderness Heart: regular rate and rhythm Lungs: Clear to auscultation bilaterally. Vascular: No carotid bruits. Skin/Extremities: No rash, no edema Neurological Exam: Mental status: alert and oriented to person, place, and time, no dysarthria or aphasia.  Fund of knowledge is appropriate.  Recent and remote memory intact.  Attention span and concentration normal.  Repeats and names without difficulty. Cranial nerves: CN I: not tested CN II: pupils equal, round and reactive to light, visual fields intact, fundi unremarkable. CN III, IV, VI:  full range of motion, no nystagmus, no ptosis CN V: facial sensation intact CN VII: upper and lower face symmetric CN VIII: hearing intact CN IX, X: gag intact, uvula midline CN XI: sternocleidomastoid and trapezius muscles intact CN XII: tongue midline Bulk & Tone: normal, no fasciculations. Motor: 5/5 throughout with no pronator drift. Sensation: intact to light touch, cold, pin, vibration and joint position sense.  No extinction to double simultaneous stimulation.  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  Last convulsion was in 2013.  Her neurological exam is normal, head CT in 2014 unremarkable.  She has never had an EEG, and this will be scheduled to further classify her seizures. She has been on Dilantin since then, and had side effects on high dose Dilantin.  She is currently on 427m BID with no side effects and no seizures or seizure-like symptoms since 2013.  There is note of a Dilantin level of 8.8 last month, she had bloodwork done recently and this will be obtained for review.  We discussed that if she is seizure-free on this dose, I would recommend continuation of Dilantin at current dose.  We discussed the option of switching to a different medication, particularly with osteoporosis and Dilantin causing bone loss, however she is very hesitant and would like to continue on current medication at this time.  A DEXA scan should be done within this year (every 2 years) to monitor osteoporosis and treat if needed.  Continue daily calcium with vitamin D.  We discussed seizure triggers, including sleep deprivation, missing medications, alcohol, and infection.  [place] driving laws were discussed with the patient, and she knows to stop driving after a seizure, until 6 months seizure-free. She will follow-up in 1 year and knows to call our office for any problems.  Thank you for allowing me to participate in the care of this patient. Please do not hesitate to call for any questions or concerns.   [name] M.D.  CC: DEstrella DeedsNP   Received labs from PCP: Dilantin levels:  04/05/14: 11.2 01/02/14: 8.8 07/15/13: 28.8 07/13/13: 37.8 04/25/13: 26.2  No recent vitamin D level, will order and  send results to PCP.

## 2014-05-01 ENCOUNTER — Ambulatory Visit (INDEPENDENT_AMBULATORY_CARE_PROVIDER_SITE_OTHER): Payer: Medicare Other | Admitting: Neurology

## 2014-05-01 DIAGNOSIS — R569 Unspecified convulsions: Secondary | ICD-10-CM

## 2014-05-01 NOTE — Progress Notes (Signed)
Patient came in for EEG. See Procedure notes for EEG results.

## 2014-05-01 NOTE — Procedures (Signed)
ELECTROENCEPHALOGRAM REPORT  Date of Study: 05/01/2014  Patient's Name: [name] MRN: [id number] Date of Birth: June 04, 1946  Referring Provider: Dr. 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 EEG for classification  Medications: Dilantin, aspirin, vitamin B-12, calcium, vitamin D  Technical Summary: A multichannel digital EEG recording measured by the international 10-20 system with electrodes applied with paste and impedances below 5000 ohms performed in our laboratory with EKG monitoring in an awake and drowsy patient.  Hyperventilation and photic stimulation were performed.  The digital EEG was referentially recorded, reformatted, and digitally filtered in a variety of bipolar and referential montages for optimal display.  Spike detection software was employed.  Description: The patient is awake and drowsy during the recording.  During maximal wakefulness, there is a symmetric, medium voltage 8.5 Hz posterior dominant rhythm that attenuates with eye opening.  There is occasional independent focal 4-5 Hz theta slowing seen over the bilateral temporal regions, left greater than right, at times sharply contoured without clear epileptogenic potential.  During drowsiness, there is an increase in theta slowing of the background.  Hyperventilation and photic stimulation did not elicit any abnormalities.  There were no clear epileptiform discharges or electrographic seizures seen.    EKG lead was unremarkable.  Impression: This awake and drowsy EEG is abnormal due to occasional independent focal slowing over the bilateral temporal regions, left greater than right.  Clinical Correlation of the above findings indicates focal cerebral dysfunction over the  bilateral temporal regions suggestive of underlying structural or physiologic abnormality.  The absence of epileptiform discharges does not rule out a clinical diagnosis of epilepsy. Clinical correlation is advised.   Ellouise Newer, M.D.

## 2014-05-20 ENCOUNTER — Telehealth: Payer: Self-pay | Admitting: Neurology

## 2014-05-20 NOTE — Telephone Encounter (Signed)
Pt wants EEG results please call [phone]

## 2014-05-21 NOTE — Telephone Encounter (Signed)
Please advise.  Thanks.  -- Caryl Pina

## 2014-05-22 NOTE — Telephone Encounter (Signed)
I spoke to patient, discussed EEG results which show bilateral temporal slowing, non-specific, no epileptiform discharges. Would continue same dose of Dilantin. Pt expressed understanding and will follow-up annually.

## 2014-09-07 ENCOUNTER — Encounter: Payer: Self-pay | Admitting: Gastroenterology

## 2014-09-27 ENCOUNTER — Other Ambulatory Visit: Payer: Self-pay

## 2014-12-13 HISTORY — PX: COLONOSCOPY: SHX174

## 2015-01-06 ENCOUNTER — Encounter: Payer: Self-pay | Admitting: Internal Medicine

## 2015-01-06 ENCOUNTER — Ambulatory Visit (INDEPENDENT_AMBULATORY_CARE_PROVIDER_SITE_OTHER): Payer: Medicare Other | Admitting: Internal Medicine

## 2015-01-06 ENCOUNTER — Other Ambulatory Visit (INDEPENDENT_AMBULATORY_CARE_PROVIDER_SITE_OTHER): Payer: Medicare Other

## 2015-01-06 VITALS — BP 118/66 | HR 92 | Ht 61.0 in | Wt 98.2 lb

## 2015-01-06 DIAGNOSIS — R1031 Right lower quadrant pain: Secondary | ICD-10-CM

## 2015-01-06 DIAGNOSIS — K5090 Crohn's disease, unspecified, without complications: Secondary | ICD-10-CM

## 2015-01-06 DIAGNOSIS — E538 Deficiency of other specified B group vitamins: Secondary | ICD-10-CM

## 2015-01-06 LAB — COMPREHENSIVE METABOLIC PANEL
ALK PHOS: 139 U/L — AB (ref 39–117)
ALT: 34 U/L (ref 0–35)
AST: 31 U/L (ref 0–37)
Albumin: 3.3 g/dL — ABNORMAL LOW (ref 3.5–5.2)
BUN: 10 mg/dL (ref 6–23)
CHLORIDE: 104 meq/L (ref 96–112)
CO2: 27 mEq/L (ref 19–32)
Calcium: 9 mg/dL (ref 8.4–10.5)
Creatinine, Ser: 1 mg/dL (ref 0.40–1.20)
GFR: 58.17 mL/min — ABNORMAL LOW (ref >60.00)
Glucose, Bld: 179 mg/dL — ABNORMAL HIGH (ref 70–99)
Potassium: 3.8 mEq/L (ref 3.5–5.1)
SODIUM: 139 meq/L (ref 135–145)
Total Bilirubin: 0.2 mg/dL (ref 0.2–1.2)
Total Protein: 6.2 g/dL (ref 6.0–8.3)

## 2015-01-06 LAB — CBC WITH DIFFERENTIAL/PLATELET
BASOS ABS: 0 10*3/uL (ref 0.0–0.1)
BASOS PCT: 0.4 % (ref 0.0–3.0)
EOS PCT: 0.6 % (ref 0.0–5.0)
Eosinophils Absolute: 0 10*3/uL (ref 0.0–0.7)
HEMATOCRIT: 35.6 % — AB (ref 36.0–46.0)
HEMOGLOBIN: 12.1 g/dL (ref 12.0–15.0)
LYMPHS ABS: 0.7 10*3/uL (ref 0.7–4.0)
Lymphocytes Relative: 23 % (ref 12.0–46.0)
MCHC: 34 g/dL (ref 30.0–36.0)
MCV: 91.3 fl (ref 78.0–100.0)
MONO ABS: 0.1 10*3/uL (ref 0.1–1.0)
MONOS PCT: 3.7 % (ref 3.0–12.0)
NEUTROS ABS: 2.2 10*3/uL (ref 1.4–7.7)
NEUTROS PCT: 72.3 % (ref 43.0–77.0)
PLATELETS: 158 10*3/uL (ref 150.0–400.0)
RBC: 3.9 Mil/uL (ref 3.87–5.11)
RDW: 15.2 % (ref 11.5–15.5)
WBC: 3 10*3/uL — ABNORMAL LOW (ref 4.0–10.5)

## 2015-01-06 LAB — VITAMIN B12: VITAMIN B 12: 474 pg/mL (ref 211–911)

## 2015-01-06 LAB — HIGH SENSITIVITY CRP: CRP HIGH SENSITIVITY: 8.56 mg/L — AB (ref 0.000–5.000)

## 2015-01-06 NOTE — Patient Instructions (Signed)
Your physician has requested that you go to the basement for lab work before leaving today  Please follow up with Dr. Henrene Pastor in one year

## 2015-01-06 NOTE — Progress Notes (Signed)
HISTORY OF PRESENT ILLNESS:  [name] is a 69 y.o. female with remote history of Crohn's disease for which she is status post ileocecectomy. Previous patient of Dr. Verl Blalock prior to his retirement. She was last evaluated in July 2014 with abdominal complaints. Imaging suggested Crohn's disease involving the small bowel from the duodenum to the ileum. In August 2014 she underwent colonoscopy and upper endoscopy. The colon was normal. The anastomosis unremarkable. The anastomosis was narrow and the scope did not pass proximal. Biopsies taken and returned benign small bowel. Upper endoscopy was normal with no evidence of Crohn's. Her intermittent symptoms were felt to be on the basis of benign stricturing. She states that she did well for approximately one year. In July 2015 she reports an episode of right lower quadrant pain associated with difficulty moving her bowels. She did vomit once. The discomfort resolved within 1 day. Since that time she has had one additional episode. Otherwise she feels well. No active complaints. She is on no medication for Crohn's. Had been on 6-mercaptopurine previously, though this was stopped secondary to leukopenia. She continues on chronic B12 replacement  REVIEW OF SYSTEMS:  All non-GI ROS negative on comprehensive review  Past Medical History  Diagnosis Date  . Personal history of malignant neoplasm of other parts of uterus   . Osteoporosis, unspecified   . Other and unspecified hyperlipidemia   . Other convulsions   . Malignant neoplasm of breast (female), unspecified site   . Malignant neoplasm of corpus uteri, except isthmus   . Abdominal pain, unspecified site   . Diarrhea   . Personal history of colonic polyps     hyperplastic  . Crohn's disease   . IBD (inflammatory bowel disease)   . GERD (gastroesophageal reflux disease)     Past Surgical History  Procedure Laterality Date  . Colon resection    . Abdominal hysterectomy    .  Cholecystectomy    . Breast lumpectomy    . Elbow surgery      left    Social History [name]  reports that she has never smoked. She has never used smokeless tobacco. She reports that she does not drink alcohol or use illicit drugs.  family history includes Aneurysm in her father; Breast cancer in her maternal aunt; Heart disease in her mother. There is no history of Colon cancer.  Allergies  Allergen Reactions  . Penicillins   . Pneumococcal Vac Polyvalent Other (See Comments)    Local swelling  . Tetanus Toxoid        PHYSICAL EXAMINATION: Vital signs: BP 118/66 mmHg  Pulse 92  Ht 5' 1"  (1.549 m)  Wt 98 lb 4 oz (44.566 kg)  BMI 18.57 kg/m2 General: Well-developed, well-nourished, no acute distress HEENT: Sclerae are anicteric, conjunctiva pink. Oral mucosa intact Lungs: Clear Heart: Regular Abdomen: soft, nontender, nondistended, no obvious ascites, no peritoneal signs, normal bowel sounds. No organomegaly. Prior surgical incision well-healed Extremities: No edema Psychiatric: alert and oriented x3. Cooperative   ASSESSMENT:  #1. History of ileal Crohn's status post remote ileocecectomy. Colonoscopy and upper endoscopy 2014 as described. No compelling evidence for active Crohn's #2. Infrequent short-lived right lower quadrant pain. May be partial obstructive symptoms from adhesions or stenosis. Overall, does quite well #3. History of B12 deficiency on replacement   PLAN:  #1. Obtain CBC, comprehensive metabolic panel, C-reactive protein, and B12 level today #2. Laxative as needed for constipation #3. Routine office follow-up in 1 year. Contact  the office in the interim for questions or problems. Certainly, she is reevaluation if she were to have increased frequency or severity of abdominal pain.

## 2015-01-09 ENCOUNTER — Telehealth: Payer: Self-pay | Admitting: Neurology

## 2015-01-09 DIAGNOSIS — R569 Unspecified convulsions: Secondary | ICD-10-CM

## 2015-01-09 NOTE — Telephone Encounter (Signed)
Results not on EPIC, pls request results. Let her know that let us repeat another Dilantin level for now. Thanks

## 2015-01-09 NOTE — Telephone Encounter (Signed)
Pt called wanting to speak to a nurse regarding her DILANTIN. Please call pt# [phone] and/or [phone]

## 2015-01-09 NOTE — Telephone Encounter (Signed)
I spoke with her. She states she had her Dilantin level checked on 1/21 & it was 4.3. She is concerned about this level & wanted to know if she needed to do anything differently. She is currently on 100 mg 2 tablets twice daily. Please advise.

## 2015-01-10 NOTE — Telephone Encounter (Signed)
I have called patient's pcp office & requested fax of recent lab results. I have also faxed them an order to draw a new dilantin level on patient.   Patient was notified of this & she will go Monday morning to have them draw her blood.

## 2015-01-10 NOTE — Telephone Encounter (Signed)
[facility] Christie Beckers, NP

## 2015-01-16 ENCOUNTER — Telehealth: Payer: Self-pay | Admitting: Neurology

## 2015-01-16 DIAGNOSIS — R569 Unspecified convulsions: Secondary | ICD-10-CM

## 2015-01-16 NOTE — Telephone Encounter (Signed)
I put her new Dilantin level results from Monday in your basket. She currently takes 100 mg 2 tabs bid. Do you have any new advisement for her?

## 2015-01-16 NOTE — Telephone Encounter (Signed)
Pt had blood work done and would like the results please call [phone] or [phone]

## 2015-01-17 ENCOUNTER — Other Ambulatory Visit: Payer: Self-pay | Admitting: Family Medicine

## 2015-01-17 DIAGNOSIS — R569 Unspecified convulsions: Secondary | ICD-10-CM

## 2015-01-17 MED ORDER — PHENYTOIN SODIUM EXTENDED 30 MG PO CAPS: ORAL_CAPSULE | ORAL | Status: DC

## 2015-01-17 NOTE — Telephone Encounter (Signed)
I spoke with patient. She states appearance of her pills haven't changed & she hasn't had any seizures. Advised patient of 30 mg increase in the evenings, new Rx sent to her pharmacy. She will come in to [facility] to have her level rechecked on Wed. 2/24. Future order out in epic printed & mailed to patient to bring with her when she comes in for her labs.

## 2015-01-17 NOTE — Telephone Encounter (Signed)
Please let her know that Dilantin level is still low at 4.8. Any change in the pill appearance given to her by pharmacy? Any seizures? For now, would add 66m Dilantin, she will take 2024min AM, 23065mn PM. Let's recheck level in 2 weeks after increasing dose. Call us [place] side effects. Thanks

## 2015-02-06 ENCOUNTER — Telehealth: Payer: Self-pay | Admitting: Family Medicine

## 2015-02-06 LAB — PHENYTOIN LEVEL, TOTAL: Phenytoin Lvl: 10.7 ug/mL (ref 10.0–20.0)

## 2015-02-06 NOTE — Telephone Encounter (Signed)
Lmovm to return my call.

## 2015-02-06 NOTE — Telephone Encounter (Signed)
-----   Message from Cameron Sprang, MD sent at 02/06/2015  9:17 AM EST ----- Pls let her know Dilantin level is good, 10.7, now within range (range is 10-20). Is she feeling ok on higher dose? Thanks

## 2015-02-07 NOTE — Telephone Encounter (Signed)
Notified her of lab results. She is doing well on the increase.

## 2015-02-07 NOTE — Telephone Encounter (Signed)
Pt called/returning your call at 2:04PM. C/b [phone]

## 2015-04-21 ENCOUNTER — Ambulatory Visit (INDEPENDENT_AMBULATORY_CARE_PROVIDER_SITE_OTHER): Payer: Medicare Other | Admitting: Neurology

## 2015-04-21 ENCOUNTER — Encounter: Payer: Self-pay | Admitting: Neurology

## 2015-04-21 VITALS — BP 104/64 | HR 92 | Resp 16 | Ht 62.0 in | Wt 100.0 lb

## 2015-04-21 DIAGNOSIS — G40309 Generalized idiopathic epilepsy and epileptic syndromes, not intractable, without status epilepticus: Secondary | ICD-10-CM | POA: Diagnosis not present

## 2015-04-21 MED ORDER — PHENYTOIN SODIUM EXTENDED 30 MG PO CAPS: ORAL_CAPSULE | ORAL | Status: DC

## 2015-04-21 NOTE — Progress Notes (Signed)
NEUROLOGY FOLLOW UP OFFICE NOTE  [name] [id number]  HISTORY OF PRESENT ILLNESS: I had the pleasure of seeing [name] in follow-up in the neurology clinic on 04/21/2015.  The patient was last seen a year ago for seizures. Records and images were personally reviewed where available.  Her routine EEG had shown occasional independent focal slowing over the bilateral temporal regions, left greater than right. No epileptiform discharges seen. On her initial visit, she had been taking Dilantin 226m BID with no side effects. She had bloodwork with her PCP in January, 4.3. She denied missing any medications, no seizures. Repeat level was done in February, level was 4.8. Her dose was increased to 2080min AM, 23055mn PM. Repeat Dilantin level were 10.7 on 02/06/15; most recently 13.5 on 04/07/15. She denies any side effects on this dose. She denies any dizziness, diplopia, gait instability. No headaches, focal numbness/tingling/weakness, staring/unresponsive episodes, gaps in time. She had a bone scan in December, results unavailable for review, she had seen her doctor last Friday with plans for possibly restarting Reclast and physical therapy.   HPI: This is a pleasant [age] RH woman with a history of Crohn's disease, uterine cancer, and breast cancer s/p lumpectomy, chemo and radiation therapy 5 years ago, who started having seizures at age [age] 58.51he recalls going to the bathroom then back to bed early morning, then her next recollection was being brought to the hospital. Her husband heard her scream out followed by whole body stiffening and shaking lasting 2-3 minutes, with associated tongue bite and urinary incontinence. She was brought to the hospital and not started on medication. Six months after, she had a second convulsion out of sleep with similar screaming out and body stiffening and shaking. She was again brought to the hospital, and per patient, not started on medication.  A month later, she had a third nocturnal convulsion and saw her PCP who started her on Dilantin 400m28my. She may or may not have seen a neurologist, she is unsure, but does not recall having an EEG done. She recalls being told that she had UTIs when she had the seizures. She was seizure-free for 9 years until 2013 when she forgot to take her medication and had another nocturnal convulsion where she fractured her right arm. Dilantin dose was increased to 600mg32m. After a few months, she started having dizziness and difficulty ambulating. She went to the ER where he Dilantin level was supratherapeutic and she was instructed to stop the medication for a few days then restart at 200mg 39m She has been doing well since with no seizures and no further side effects. She and her husband deny any staring/unresponsive episodes, no gaps in time, olfactory/gustatory hallucinations, deja vu, rising epigastric sensation, focal numbness/tingling/weakness, myoclonic jerks. She denies any dizziness, diplopia, gait instability, gum problems. She had a DEXA scan in 11/2012 and has a diagnosis of osteoporosis. She denies any headaches, dysarthria, dysphagia, neck/back pain, bowel/bladder dysfunction. No chest pain or shortness of breath. She is driving.  Epilepsy Risk Factors: Her maternal aunt had seizures, otherwise she had a normal birth and early development. There is no history of febrile convulsions, CNS infections such as meningitis/encephalitis, significant traumatic brain injury, neurosurgical procedures.  Prior AEDs: none EEGs: as above Imaging: Head CT without contrast done 07/13/2013 reported as unremarkable.  PAST MEDICAL HISTORY: Past Medical History  Diagnosis Date  . Personal history of malignant neoplasm of other parts of uterus   . Osteoporosis,  unspecified   . Other and unspecified hyperlipidemia   . Other convulsions   . Malignant neoplasm of breast (female), unspecified site   .  Malignant neoplasm of corpus uteri, except isthmus   . Abdominal pain, unspecified site   . Diarrhea   . Personal history of colonic polyps     hyperplastic  . Crohn's disease   . IBD (inflammatory bowel disease)   . GERD (gastroesophageal reflux disease)     MEDICATIONS: Current Outpatient Prescriptions on File Prior to Visit  Medication Sig Dispense Refill  . aspirin 81 MG tablet Take 81 mg by mouth daily.    . Calcium Carbonate (CALCARB 600) 1500 MG TABS Take 1 tablet by mouth 2 (two) times daily.      . Cholecalciferol (VITAMIN D) 1000 UNITS capsule Take 1,000 Units by mouth 2 (two) times daily.      . cyanocobalamin (,VITAMIN B-12,) 1000 MCG/ML injection Inject 1,000 mcg into the muscle every 30 (thirty) days.      . phenytoin (DILANTIN) 100 MG ER capsule Take 100 mg by mouth. 2 tabs twice a day    . phenytoin (DILANTIN) 30 MG ER capsule Take 1 capsule every PM 30 capsule 3  . potassium chloride SA (K-DUR,KLOR-CON) 20 MEQ tablet Take 40 mEq by mouth 2 (two) times daily.      No current facility-administered medications on file prior to visit.    ALLERGIES: Allergies  Allergen Reactions  . Penicillins   . Pneumococcal Vac Polyvalent Other (See Comments)    Local swelling  . Tetanus Toxoid     FAMILY HISTORY: Family History  Problem Relation Age of Onset  . Breast cancer Maternal Aunt   . Heart disease Mother   . Aneurysm Father   . Colon cancer Neg Hx     SOCIAL HISTORY: History   Social History  . Marital Status: Married    Spouse Name: N/A  . Number of Children: 2  . Years of Education: N/A   Occupational History  . Retired    Social History Main Topics  . Smoking status: Never Smoker   . Smokeless tobacco: Never Used  . Alcohol Use: No  . Drug Use: No  . Sexual Activity: Not on file   Other Topics Concern  . Not on file   Social History Narrative    REVIEW OF SYSTEMS: Constitutional: No fevers, chills, or sweats, no generalized fatigue,  change in appetite Eyes: No visual changes, double vision, eye pain Ear, nose and throat: No hearing loss, ear pain, nasal congestion, sore throat Cardiovascular: No chest pain, palpitations Respiratory:  No shortness of breath at rest or with exertion, wheezes GastrointestinaI: No nausea, vomiting, diarrhea, abdominal pain, fecal incontinence Genitourinary:  No dysuria, urinary retention or frequency Musculoskeletal:  No neck pain, back pain Integumentary: No rash, pruritus, skin lesions Neurological: as above Psychiatric: No depression, insomnia, anxiety Endocrine: No palpitations, fatigue, diaphoresis, mood swings, change in appetite, change in weight, increased thirst Hematologic/Lymphatic:  No anemia, purpura, petechiae. Allergic/Immunologic: no itchy/runny eyes, nasal congestion, recent allergic reactions, rashes  PHYSICAL EXAM: Filed Vitals:   04/21/15 1032  BP: 104/64  Pulse: 92  Resp: 16   General: No acute distress Head:  Normocephalic/atraumatic Neck: supple, no paraspinal tenderness, full range of motion Heart:  Regular rate and rhythm Lungs:  Clear to auscultation bilaterally Back: No paraspinal tenderness Skin/Extremities: No rash, no edema Neurological Exam: alert and oriented to person, place, and time. No aphasia or dysarthria. Fund of  knowledge is appropriate.  Recent and remote memory are intact. 3/3 delayed recall.  Attention and concentration are normal.    Able to name objects and repeat phrases. Cranial nerves: Pupils equal, round, reactive to light.  Fundoscopic exam unremarkable, no papilledema. Extraocular movements intact with no nystagmus. Visual fields full. Facial sensation intact. No facial asymmetry. Tongue, uvula, palate midline.  Motor: Bulk and tone normal, muscle strength 5/5 throughout with no pronator drift.  Sensation to light touch intact.  No extinction to double simultaneous stimulation.  Deep tendon reflexes 2+ throughout, toes downgoing.   Finger to nose testing intact.  Gait narrow-based and steady, able to tandem walk adequately.  Romberg negative.  IMPRESSION: This is a pleasant 69 yo RH woman with a history of Crohn's disease, uterine cancer, breast cancer s/p lumpectomy, chemo, and radiation therapy 5 years ago, with a history of 4 convulsive seizures that occur out of sleep since age 69 or 35. Last convulsion was in 2013. Her neurological exam is normal, head CT in 2014 unremarkable. She has never had an EEG, and this will be scheduled to further classify her seizures. She has been on Dilantin since then, and had side effects on high dose Dilantin. She is currently on 421m BID with no side effects and no seizures or seizure-like symptoms since 2013 after missing medications. She has had supra and subtherapeutic Dilantin levels, most recently Dilantin dose was increased to 4393mday after subtherapeutic levels. We discussed that if she had been seizure-free on current dose, despite levels I would stay on the same dose, however she is understandably anxious about having another seizure. She is tolerating slight increase in Dilantin dose, with last level of 13.5. Continue current dose of 2004mn AM, 230m86m PM. We discussed other options for AEDs, particularly with side effects of bone loss with Dilantin. She will think about this and knows to call us [place] any questions. She is aware of [place] driving laws to stop driving after a seizure, until 6 months seizure-free. She will follow-up in 1 year and knows to call our office for any problems.  Thank you for allowing me to participate in her care.  Please do not hesitate to call for any questions or concerns.  The duration of this appointment visit was 15 minutes of face-to-face time with the patient.  Greater than 50% of this time was spent in counseling, explanation of diagnosis, planning of further management, and coordination of care.   KareEllouise NewerD.   CC: Dr.  SmitTamala Julian

## 2015-04-21 NOTE — Patient Instructions (Signed)
1. Continue current dose of Dilantin (267m in AM, 2356min PM) 2. If you would like to switch to a different medication, please call our office for an earlier appointment 3. Continue to monitor bone health, continue daily calcium with vitamin D 4. Follow-up in 1 year  Seizure Precautions: 1. If medication has been prescribed for you to prevent seizures, take it exactly as directed.  Do not stop taking the medicine without talking to your doctor first, even if you have not had a seizure in a long time.   2. Avoid activities in which a seizure would cause danger to yourself or to others.  Don't operate dangerous machinery, swim alone, or climb in high or dangerous places, such as on ladders, roofs, or girders.  Do not drive unless your doctor says you may.  3. If you have any warning that you may have a seizure, lay down in a safe place where you can't hurt yourself.    4.  No driving for 6 months from last seizure, [facility]  Please refer to the following link on the [place] for more information: [email or web address]   5.  Maintain good sleep hygiene.  6.  Contact your doctor if you have any problems that may be related to the medicine you are taking.  7.  Call 911 and bring the patient back to the ED if:        A.  The seizure lasts longer than 5 minutes.       B.  The patient doesn't awaken shortly after the seizure  C.  The patient has new problems such as difficulty seeing, speaking or moving  D.  The patient was injured during the seizure  E.  The patient has a temperature over 102 F (39C)  F.  The patient vomited and now is having trouble breathing

## 2015-06-09 ENCOUNTER — Other Ambulatory Visit: Payer: Self-pay

## 2015-08-20 ENCOUNTER — Telehealth: Payer: Self-pay | Admitting: Family Medicine

## 2015-08-20 NOTE — Telephone Encounter (Signed)
Received a call from Butch Penny Smith's/FNP office from her assistant Caryl Pina. Stating that Butch Penny wanted Dr. Delice Lesch to review the patient's last 2 CBC results that patient had. She did fax over those results from 08/04/15 & 08/20/15. States that Butch Penny is concerned with patient's platplatelet count being low 132(8/22) & 116(9/7) and she thinks that it could be the Dilantin that the patient is on that's causing this. I did give Dr. Delice Lesch the faxed results and she states that she highly doubts that this is coming from the Dilantin and thinks that the patient should see Hematology.   I called Caryl Pina back and spoke with her about above and Dr. Amparo Bristol recommendation for Hematology. She states she will talk with Butch Penny about this. I told her to give me a call back if she needed help with anything else.

## 2015-08-22 ENCOUNTER — Telehealth: Payer: Self-pay | Admitting: Neurology

## 2015-08-22 NOTE — Telephone Encounter (Signed)
Records reviewed,  08/04/15: WBC 3.2, Hct 33.8, Plt 132 08/20/15: WBC 3.2, Hct 33, Plt 116  Received call from patient's PCP regarding concern that Dilantin is cause of low platelet counts. She has been on this medication for more than 10 years, highly unlikely, would recommend seeing Hematology first. See other phone note for further details.

## 2015-08-31 ENCOUNTER — Encounter (HOSPITAL_COMMUNITY): Payer: Self-pay | Admitting: *Deleted

## 2015-08-31 ENCOUNTER — Inpatient Hospital Stay (HOSPITAL_COMMUNITY)
Admission: AD | Admit: 2015-08-31 | Discharge: 2015-09-04 | DRG: 386 | Disposition: A | Discharge status: Home / Self Care | Payer: Medicare Other | Source: Other Acute Inpatient Hospital | Attending: Internal Medicine | Admitting: Internal Medicine

## 2015-08-31 ENCOUNTER — Inpatient Hospital Stay (HOSPITAL_COMMUNITY): Payer: Medicare Other

## 2015-08-31 DIAGNOSIS — Z803 Family history of malignant neoplasm of breast: Secondary | ICD-10-CM

## 2015-08-31 DIAGNOSIS — Z90710 Acquired absence of both cervix and uterus: Secondary | ICD-10-CM | POA: Diagnosis not present

## 2015-08-31 DIAGNOSIS — Z887 Allergy status to serum and vaccine status: Secondary | ICD-10-CM

## 2015-08-31 DIAGNOSIS — K50812 Crohn's disease of both small and large intestine with intestinal obstruction: Secondary | ICD-10-CM | POA: Diagnosis present

## 2015-08-31 DIAGNOSIS — K5080 Crohn's disease of both small and large intestine without complications: Secondary | ICD-10-CM | POA: Diagnosis not present

## 2015-08-31 DIAGNOSIS — K50112 Crohn's disease of large intestine with intestinal obstruction: Secondary | ICD-10-CM | POA: Diagnosis present

## 2015-08-31 DIAGNOSIS — G40909 Epilepsy, unspecified, not intractable, without status epilepticus: Secondary | ICD-10-CM | POA: Diagnosis not present

## 2015-08-31 DIAGNOSIS — K50919 Crohn's disease, unspecified, with unspecified complications: Secondary | ICD-10-CM | POA: Diagnosis not present

## 2015-08-31 DIAGNOSIS — N289 Disorder of kidney and ureter, unspecified: Secondary | ICD-10-CM | POA: Diagnosis not present

## 2015-08-31 DIAGNOSIS — D709 Neutropenia, unspecified: Secondary | ICD-10-CM | POA: Diagnosis present

## 2015-08-31 DIAGNOSIS — D696 Thrombocytopenia, unspecified: Secondary | ICD-10-CM | POA: Diagnosis not present

## 2015-08-31 DIAGNOSIS — K5090 Crohn's disease, unspecified, without complications: Secondary | ICD-10-CM | POA: Diagnosis not present

## 2015-08-31 DIAGNOSIS — Z853 Personal history of malignant neoplasm of breast: Secondary | ICD-10-CM

## 2015-08-31 DIAGNOSIS — Z23 Encounter for immunization: Secondary | ICD-10-CM

## 2015-08-31 DIAGNOSIS — E876 Hypokalemia: Secondary | ICD-10-CM | POA: Diagnosis present

## 2015-08-31 DIAGNOSIS — Z8542 Personal history of malignant neoplasm of other parts of uterus: Secondary | ICD-10-CM

## 2015-08-31 DIAGNOSIS — G40309 Generalized idiopathic epilepsy and epileptic syndromes, not intractable, without status epilepticus: Secondary | ICD-10-CM | POA: Diagnosis not present

## 2015-08-31 DIAGNOSIS — M810 Age-related osteoporosis without current pathological fracture: Secondary | ICD-10-CM | POA: Diagnosis present

## 2015-08-31 DIAGNOSIS — K56609 Unspecified intestinal obstruction, unspecified as to partial versus complete obstruction: Secondary | ICD-10-CM | POA: Diagnosis present

## 2015-08-31 DIAGNOSIS — Z8719 Personal history of other diseases of the digestive system: Secondary | ICD-10-CM

## 2015-08-31 DIAGNOSIS — Z9049 Acquired absence of other specified parts of digestive tract: Secondary | ICD-10-CM | POA: Diagnosis present

## 2015-08-31 DIAGNOSIS — Z880 Allergy status to penicillin: Secondary | ICD-10-CM

## 2015-08-31 DIAGNOSIS — Z86010 Personal history of colonic polyps: Secondary | ICD-10-CM

## 2015-08-31 DIAGNOSIS — K5660 Unspecified intestinal obstruction: Secondary | ICD-10-CM | POA: Diagnosis not present

## 2015-08-31 DIAGNOSIS — Z7982 Long term (current) use of aspirin: Secondary | ICD-10-CM

## 2015-08-31 DIAGNOSIS — G40409 Other generalized epilepsy and epileptic syndromes, not intractable, without status epilepticus: Secondary | ICD-10-CM | POA: Diagnosis present

## 2015-08-31 DIAGNOSIS — Z79899 Other long term (current) drug therapy: Secondary | ICD-10-CM

## 2015-08-31 HISTORY — DX: Epilepsy, unspecified, not intractable, without status epilepticus: G40.909

## 2015-08-31 LAB — CBC
HCT: 34.6 % — ABNORMAL LOW (ref 36.0–46.0)
HEMOGLOBIN: 11.6 g/dL — AB (ref 12.0–15.0)
MCH: 31.2 pg (ref 26.0–34.0)
MCHC: 33.5 g/dL (ref 30.0–36.0)
MCV: 93 fL (ref 78.0–100.0)
Platelets: 121 10*3/uL — ABNORMAL LOW (ref 150–400)
RBC: 3.72 MIL/uL — AB (ref 3.87–5.11)
RDW: 14.8 % (ref 11.5–15.5)
WBC: 6.4 10*3/uL (ref 4.0–10.5)

## 2015-08-31 LAB — CREATININE, SERUM
CREATININE: 1.02 mg/dL — AB (ref 0.44–1.00)
GFR calc non Af Amer: 54 mL/min — ABNORMAL LOW (ref >60)

## 2015-08-31 LAB — GLUCOSE, CAPILLARY: Glucose-Capillary: 86 mg/dL (ref 65–99)

## 2015-08-31 MED ORDER — IOHEXOL 300 MG/ML  SOLN
25.0000 mL | INTRAMUSCULAR | Status: AC
  Administered 2015-08-31 (×2): 25 mL via ORAL

## 2015-08-31 MED ORDER — SODIUM CHLORIDE 0.9 % IV SOLN
230.0000 mg | Freq: Every day | INTRAVENOUS | Status: DC
  Administered 2015-08-31 – 2015-09-01 (×2): 230 mg via INTRAVENOUS
  Filled 2015-08-31 (×4): qty 4.6

## 2015-08-31 MED ORDER — SODIUM CHLORIDE 0.9 % IV SOLN
200.0000 mg | Freq: Every day | INTRAVENOUS | Status: DC
  Administered 2015-09-01 – 2015-09-02 (×2): 200 mg via INTRAVENOUS
  Filled 2015-08-31 (×4): qty 4

## 2015-08-31 MED ORDER — IOHEXOL 300 MG/ML  SOLN
75.0000 mL | Freq: Once | INTRAMUSCULAR | Status: AC | PRN
  Administered 2015-08-31: 75 mL via INTRAVENOUS

## 2015-08-31 MED ORDER — METHYLPREDNISOLONE SODIUM SUCC 125 MG IJ SOLR
80.0000 mg | Freq: Three times a day (TID) | INTRAMUSCULAR | Status: DC
  Administered 2015-08-31 – 2015-09-01 (×4): 80 mg via INTRAVENOUS
  Filled 2015-08-31 (×4): qty 2

## 2015-08-31 MED ORDER — ENOXAPARIN SODIUM 40 MG/0.4ML ~~LOC~~ SOLN
40.0000 mg | SUBCUTANEOUS | Status: DC
  Administered 2015-08-31 – 2015-09-01 (×2): 40 mg via SUBCUTANEOUS
  Filled 2015-08-31 (×3): qty 0.4

## 2015-08-31 MED ORDER — ONDANSETRON HCL 4 MG/2ML IJ SOLN: 4.0000 mg | Freq: Four times a day (QID) | INTRAMUSCULAR | Status: DC | PRN

## 2015-08-31 MED ORDER — INFLUENZA VAC SPLIT QUAD 0.5 ML IM SUSY
0.5000 mL | PREFILLED_SYRINGE | INTRAMUSCULAR | Status: AC
  Administered 2015-09-01: 0.5 mL via INTRAMUSCULAR
  Filled 2015-08-31: qty 0.5

## 2015-08-31 MED ORDER — SODIUM CHLORIDE 0.9 % IV SOLN
INTRAVENOUS | Status: DC
  Administered 2015-08-31 – 2015-09-03 (×3): via INTRAVENOUS

## 2015-08-31 MED ORDER — ONDANSETRON HCL 4 MG PO TABS: 4.0000 mg | ORAL_TABLET | Freq: Four times a day (QID) | ORAL | Status: DC | PRN

## 2015-08-31 MED ORDER — MORPHINE SULFATE (PF) 2 MG/ML IV SOLN
1.0000 mg | INTRAVENOUS | Status: DC | PRN
  Administered 2015-09-01: 2 mg via INTRAVENOUS
  Filled 2015-08-31 (×2): qty 1

## 2015-08-31 NOTE — Progress Notes (Signed)
  PENDING ACCEPTANCE TRANFER NOTE:  Call received from:    Dr. Herschel Senegal at [facility] REQUESTING TRANSFER:    Crohn's flare with small bowel obstruction  CC: Nausea, crampy abdominal pain  HPI:   69 year old with past medical history of Crohn's disease, remote ileocecectomy, was recently on 6-MP which was discontinued because of pancytopenia came into the ED at [facility] complaining about 2 days history of crampy abdominal pain, no bowel movements, nausea and vomiting 1. Abdominal x-rays showed proximal small bowel obstruction. Patient sees Dr. Henrene Pastor, GI on-call Dr. Havery Moros accepted the patient, hospitalist to admit.  please notify GI when patient comes to the hospital.   PLAN:  According to telephone report, this patient was accepted for transfer to [facility], under [place] [employer] team:  MCAdmit,  I have requested an order be written to call Flow Manager at [phone] upon patient arrival to the floor for final physician assignment who will do the admission and give admitting orders.  SIGNED: Birdie Hopes, MD [facility]  08/31/2015, 2:42 PM

## 2015-08-31 NOTE — Progress Notes (Signed)
New Admission Note:   Arrival Method: Via stretcher from EMS Mental Orientation: Alert and oriented X4 Telemetry: No orders per MD Assessment: Completed Skin: Warm, dry and intact IV: Clean, dry and intact. Normal saline running per MD orders  Pain: None stated at this time  Tubes: N/A Safety Measures: Safety Fall Prevention Plan has been given, discussed and signed Admission: Completed 6 [facility] Orientation: Patient has been orientated to the room, unit and staff.  Family: Lots of family at the bedside   Orders have been reviewed and implemented. Will continue to monitor the patient. Call light has been placed within reach and bed alarm has been activated.   [employer], RN-BC, RN3 [facility]  Phone number: [phone]

## 2015-08-31 NOTE — H&P (Addendum)
[facility] History and Physical  [name] LKG:[id number] DOB: 11-08-1946 DOA: 08/31/2015   PCP: Nicole Juba, NP  Gastroenterologist: Dr. Henrene Calderon   Chief Complaint: Abdominal Pain  HPI: [name] is a 69 y.o. female who is transferred from [facility] for an x-ray showing small bowel obstruction. She has a history of Crohn's disease status post ileocolectomy about 20 years ago, seizure disorder, history of breast cancer (treated), history of uterine cancer status post hysterectomy. The patient presents with 2 days of abdominal pain. Pain is mostly in her right lower quadrant and is crampy in nature. Last night she ate dinner without trouble, however, this morning she was not able to eat and vomited. She has not tried to eat since then. Pain improved with morphine given in the ER. Abdomen is distended. She had a bowel movement yesterday which she says was a small liquid one. She had a normal bowel movement the day before. No blood in stool No fever chills or sweats. No further vomiting today. She states that she has not been on medication for Crohn's disease in over 3 years. She has not had any flares in past 3 years.   General: The patient denies anorexia, fever, weight loss Cardiac: Denies chest pain, syncope, palpitations, pedal edema  Respiratory: Denies cough, shortness of breath, wheezing GI: Denies severe indigestion/heartburn GU: Denies hematuria, incontinence, dysuria  Musculoskeletal: Denies arthritis  Skin: Denies suspicious skin lesions Neurologic: Denies focal weakness or numbness, change in vision Psychiatry: Denies depression or anxiety. Hematologic:+ easy bruising and bleeding  All other systems reviewed and found to be negative.  Past Medical History  Diagnosis Date  . Osteoporosis, unspecified   . Seizure disorder   . Malignant neoplasm of breast (female), unspecified site     right  . Malignant neoplasm of corpus uteri, except  isthmus   . Personal history of colonic polyps     hyperplastic  . Crohn's disease     Past Surgical History  Procedure Laterality Date  . Colon resection- ileocecal resection    . Abdominal hysterectomy    . Cholecystectomy    . Breast lumpectomy    . Elbow surgery      left    Social History: does not smoke or drink alcohol, no ilegal drug use Lives at home with husband    Allergies  Allergen Reactions  . Penicillins   . Pneumococcal Vac Polyvalent Other (See Comments)    Local swelling  . Tetanus Toxoid     Family history:   Family History  Problem Relation Age of Onset  . Breast cancer Maternal Aunt   . Heart disease Mother   . Aneurysm Father   . Colon cancer Neg Hx       Prior to Admission medications   Medication Sig Start Date End Date Taking? Authorizing Provider  aspirin 81 MG tablet Take 81 mg by mouth daily.    Historical Provider, MD  Calcium Carbonate (CALCARB 600) 1500 MG TABS Take 1 tablet by mouth 2 (two) times daily.      Historical Provider, MD  Cholecalciferol (VITAMIN D) 1000 UNITS capsule Take 1,000 Units by mouth 2 (two) times daily.      Historical Provider, MD  cyanocobalamin (,VITAMIN B-12,) 1000 MCG/ML injection Inject 1,000 mcg into the muscle every 30 (thirty) days.      Historical Provider, MD  phenytoin (DILANTIN) 100 MG ER capsule Take 100 mg by mouth. 2 tabs twice a day  Historical Provider, MD  phenytoin (DILANTIN) 30 MG ER capsule Take 1 capsule at bedtime. Take together with Phenytoin 263m at bedtime for total of 2327mat bedtime 04/21/15   KaCameron SprangMD  potassium chloride SA (K-DUR,KLOR-CON) 20 MEQ tablet Take 40 mEq by mouth 2 (two) times daily.     Historical Provider, MD     Physical Exam: Filed Vitals:   08/31/15 1652 08/31/15 1702  BP:  107/65  Pulse:  72  Temp:  98.6 F (37 C)  TempSrc:  Oral  Resp:  17  Height: 5' 2"  (1.575 m)   Weight: 45.723 kg (100 lb 12.8 oz)   SpO2:  98%     General: Awake alert  oriented 3 HEENT: Normocephalic and Atraumatic, Mucous membranes pink                PERRLA; EOM intact; No scleral icterus,                 Nares: Patent, Oropharynx: Clear, Fair Dentition                 Neck: FROM, no cervical lymphadenopathy, thyromegaly, carotid bruit or JVD;  Breasts: deferred CHEST WALL: No tenderness  CHEST: Normal respiration, clear to auscultation bilaterally  HEART: Regular rate and rhythm; no murmurs rubs or gallops  BACK: No kyphosis or scoliosis; no CVA tenderness  GI: Positive Bowel Sounds, soft, tender in right lower quadrant, no guarding, no rebound tenderness; no masses, no organomegaly Rectal Exam: deferred MSK: No cyanosis, clubbing, or edema Genitalia: not examined  SKIN:  no rash or ulceration  CNS: Alert and Oriented x 4, Nonfocal exam, CN 2-12 intact  Labs on Admission:  Basic Metabolic Panel: Sodium 13488otassium 4.3 Chloride 96 Bicarbonate 33 BUN 9 Creatinine 0.9 Anion gap 9 Glucose 105 Calcium 9.1 Albumen 3.9 Total protein 6.8 TSH 0.7 AST 32 ALT 41 Alkaline phosphatase 155  CBC: WBC 6.8  hemoglobin 13.3  hematocrit 37.7  MCV 87.7  platelets 134  CBG:  Recent Labs Lab 08/31/15 1652  GLUCAP 86     Assessment/Plan Principal Problem:   Bowel obstruction- history of Crohn's disease -Off treatment for Crohn's disease for about 3 years without any flares -[facility] a [facility]. -She currently has bowel sounds and is not vomiting-hold off on an NG tube -Obtain CT of the abdomen -Nothing by mouth except for ice chips and pills -IV fluids while nothing by mouth Addendum- CT reveals active Crohns- will start IV steroids-   Active Problems:   Epilepsy, generalized, convulsive -Dilantin will be given via IV    Thrombocytopenia -Mild -Recently diagnosed-was to get a referral to hematology as outpatient  Chronic hypokalemia -She takes  potassium every day-will recheck metabolic panel tomorrow morning and replace potassium as needed   Consulted: GI- Dr ArHavery Morostates that Dr PeHenrene Pastorill see in AM  Code Status: Full code Family Communication: Husband at bedside  DVT Prophylaxis: Lovenox  Time spent: 5526inutes  RIWhite OakMD [facility]  If 7PM-7AM, please contact night-coverage [email or web address] 08/31/2015, 5:27 PM

## 2015-09-01 DIAGNOSIS — K50812 Crohn's disease of both small and large intestine with intestinal obstruction: Secondary | ICD-10-CM | POA: Diagnosis not present

## 2015-09-01 DIAGNOSIS — K5660 Unspecified intestinal obstruction: Secondary | ICD-10-CM

## 2015-09-01 DIAGNOSIS — K50919 Crohn's disease, unspecified, with unspecified complications: Secondary | ICD-10-CM

## 2015-09-01 LAB — COMPREHENSIVE METABOLIC PANEL
ALT: 22 U/L (ref 14–54)
AST: 19 U/L (ref 15–41)
Albumin: 2.6 g/dL — ABNORMAL LOW (ref 3.5–5.0)
Alkaline Phosphatase: 114 U/L (ref 38–126)
Anion gap: 8 (ref 5–15)
BUN: 10 mg/dL (ref 6–20)
CHLORIDE: 102 mmol/L (ref 101–111)
CO2: 23 mmol/L (ref 22–32)
CREATININE: 1.08 mg/dL — AB (ref 0.44–1.00)
Calcium: 7.5 mg/dL — ABNORMAL LOW (ref 8.9–10.3)
GFR calc non Af Amer: 50 mL/min — ABNORMAL LOW (ref >60)
GFR, EST AFRICAN AMERICAN: 58 mL/min — AB (ref >60)
Glucose, Bld: 95 mg/dL (ref 65–99)
POTASSIUM: 3.8 mmol/L (ref 3.5–5.1)
SODIUM: 133 mmol/L — AB (ref 135–145)
Total Bilirubin: 0.9 mg/dL (ref 0.3–1.2)
Total Protein: 5.1 g/dL — ABNORMAL LOW (ref 6.5–8.1)

## 2015-09-01 MED ORDER — PANTOPRAZOLE SODIUM 40 MG PO TBEC
40.0000 mg | DELAYED_RELEASE_TABLET | Freq: Two times a day (BID) | ORAL | Status: DC
  Administered 2015-09-01 (×2): 40 mg via ORAL
  Filled 2015-09-01: qty 1

## 2015-09-01 MED ORDER — METHYLPREDNISOLONE SODIUM SUCC 40 MG IJ SOLR
40.0000 mg | Freq: Every day | INTRAMUSCULAR | Status: DC
  Administered 2015-09-02 – 2015-09-03 (×2): 40 mg via INTRAVENOUS
  Filled 2015-09-01 (×2): qty 1

## 2015-09-01 MED ORDER — PANTOPRAZOLE SODIUM 40 MG PO TBEC
40.0000 mg | DELAYED_RELEASE_TABLET | Freq: Every day | ORAL | Status: DC
  Administered 2015-09-03 – 2015-09-04 (×2): 40 mg via ORAL
  Filled 2015-09-01 (×2): qty 1

## 2015-09-01 NOTE — Progress Notes (Signed)
Patient Demographics  [name], is a 69 y.o. female, DOB - [birth year], NTI:[id number]  Admit date - 08/31/2015   Admitting Physician Verlee Monte, MD  Outpatient Primary MD for the patient is Peggyann Juba, NP  LOS - 1   No chief complaint on file.      Admission HPI/Brief narrative:  Subjective:   [name] today has, No headache, No chest pain,No new weakness tingling or numbness, worse abdominal pain significantly improved, denies any nausea or vomiting.  Assessment & Plan    Principal Problem:   Bowel obstruction Active Problems:   Epilepsy, generalized, convulsive   Crohn disease   Thrombocytopenia  Abdominal pain secondary to acute Crohn's flare - Patient presents with complaints of abdominal pain, CT abdomen showing evidence of active Crohn's disease, probable ileus, no evidence of obstruction - GI consult appreciated, continue with the plan of IV hydration, pain and nausea medication, nothing by mouth for bowel rest, continue with IV steroids , GI will check QuantiFERON TB and hepatitis panel in anticipation of biologic agent in the future, may need colonoscopy per GI.  History of seizures - Continue Dilantin  Thrombocytopenia - Mild, no evidence of active bleed, monitor closely    Code Status: Full  Family Communication: husband and son at bedside  Disposition Plan: home when stable   Procedures  None   Consults   Gastroenterology   Medications  Scheduled Meds: . enoxaparin (LOVENOX) injection  40 mg Subcutaneous Q24H  . methylPREDNISolone (SOLU-MEDROL) injection  80 mg Intravenous Q8H  . phenytoin (DILANTIN) IV  200 mg Intravenous Daily  . phenytoin (DILANTIN) IV  230 mg Intravenous Daily   Continuous Infusions: . sodium chloride 125 mL/hr at 09/01/15 0541   PRN Meds:.morphine injection, ondansetron **OR** ondansetron (ZOFRAN) IV  DVT  Prophylaxis  Lovenox   Lab Results  Component Value Date   PLT 121* 08/31/2015    Antibiotics    Anti-infectives    None          Objective:   Filed Vitals:   08/31/15 1702 08/31/15 2247 09/01/15 0528 09/01/15 0845  BP: 107/65 114/61 107/55 101/50  Pulse: 72 82 83 86  Temp: 98.6 F (37 C) 98.9 F (37.2 C) 97.7 F (36.5 C) 98 F (36.7 C)  TempSrc: Oral Oral Oral Oral  Resp: 17 17 18 18   Height:  5' 2"  (1.575 m)    Weight:  45.723 kg (100 lb 12.8 oz)    SpO2: 98% 96% 96% 97%    Wt Readings from Last 3 Encounters:  08/31/15 45.723 kg (100 lb 12.8 oz)  04/21/15 45.36 kg (100 lb)  01/06/15 44.566 kg (98 lb 4 oz)     Intake/Output Summary (Last 24 hours) at 09/01/15 1317 Last data filed at 09/01/15 1245  Gross per 24 hour  Intake 1765.83 ml  Output    400 ml  Net 1365.83 ml     Physical Exam  Awake Alert, Oriented X 3, No new F.N deficits,  Hester.AT,PERRAL Supple Neck,No JVD,  Symmetrical Chest wall movement, Good air movement bilaterally,  RRR,No Gallops,Rubs or new Murmurs, No Parasternal Heave +ve B.Sounds, Abd Soft, minimal tenderness in RLQ , No rebound - guarding or rigidity. No Cyanosis, Clubbing or edema,  No new Rash or bruise     Data Review   Micro Results No results found for this or any previous visit (from the past 240 hour(s)).  Radiology Reports Ct Abdomen Pelvis W Contrast  08/31/2015   CLINICAL DATA:  HPI: 69 year old with past medical history of Crohn's disease, remote ileocecectomy, was recently on 6-MP which was discontinued because of pancytopenia came into the ED at [facility] complaining about 2 days history of crampy abdominal pain, no bowel movements, nausea and vomiting 1. Abdominal x-rays showed proximal small bowel obstruction. Past medical Hx; Seizure Disorder; Colonic Polyps; Crohns Disease  EXAM: CT ABDOMEN AND PELVIS WITH CONTRAST  TECHNIQUE: Multidetector CT imaging of the abdomen and pelvis was performed using the  standard protocol following bolus administration of intravenous contrast.  CONTRAST:  17m OMNIPAQUE IOHEXOL 300 MG/ML  SOLN  COMPARISON:  Current abdominal radiographs.  CT, 06/22/2013.  FINDINGS: Gastrointestinal: There is contrast throughout a mildly distended colon. There is an area focal narrowing of the ascending colon with associated wall thickening and adjacent mesenteric inflammation and a small amount of adjacent fluid. Wall measures 6 mm in thickness in this location. No other colonic wall thickening. A portion of the inferior ascending colon extends to the left inguinal region lateral to the left external iliac vessels. This does not cause obstruction. The distal small bowel hat and for approximately 10-15 cm proximal to the ileocolic anastomosis is thick-walled with a maximal wall thickness of 7 mm. There is adjacent mesenteric inflammation and small amount of fluid. There is another area of small bowel wall thickening in the left central abdomen. Bowel proximal to this is mildly dilated with a maximum dilation of 3.2 cm.  Liver: Multiple intrahepatic dilated ducts could there is significant dilation of the common bile duct measuring 13 mm. There is relatively abrupt distal tapering in the pancreatic head. Mild prominence is noted of the pancreatic duct. These findings are stable from the prior CT. No liver mass or focal lesion.  Spleen: Borderline enlarged measuring 12 cm in greatest dimension. No splenic mass or focal lesion.  Adrenal glands: No masses.  Kidneys, ureters, bladder: Mild bilateral renal cortical thinning. Multiple bilateral low-density renal masses consistent with cysts. No hydronephrosis. Ureters normal course and caliber. Bladder is unremarkable.  Uterus and adnexa:  Uterus surgically absent.  No pelvic masses.  Lymph nodes:  No pathologically enlarged lymph nodes.  There is no free intraperitoneal air.  Ascites: Trace ascites is seen adjacent to the liver as well as adjacent to the  inflamed right colon and distal small bowel.  Musculoskeletal: Diffusely demineralized bony structures. No osteoblastic or osteolytic lesions.  IMPRESSION: 1. Findings consistent with active Crohn's disease. There is a section of colon inflammation involving the ascending colon. There is inflammation of the distal small bowel for approximately 10-15 cm in length. Another sections small bowel appears thick walled in the central to left central abdomen. No abscess. No free air. 2. There is mild dilation of proximal small bowel, which appears to be due to adynamic ileus. There is no convincing obstruction. 3. Trace amount of ascites. 4. Chronic intern extrahepatic bile duct dilation. 5. Borderline splenomegaly. 6. Multiple small renal cysts.  Status post hysterectomy.   Electronically Signed   By: DLajean ManesM.D.   On: 08/31/2015 21:12     CBC  Recent Labs Lab 08/31/15 1833  WBC 6.4  HGB 11.6*  HCT 34.6*  PLT 121*  MCV 93.0  MCH 31.2  MCHC 33.5  RDW 14.8    Chemistries   Recent Labs Lab 08/31/15 1833 09/01/15 0507  NA  --  133*  K  --  3.8  CL  --  102  CO2  --  23  GLUCOSE  --  95  BUN  --  10  CREATININE 1.02* 1.08*  CALCIUM  --  7.5*  AST  --  19  ALT  --  22  ALKPHOS  --  114  BILITOT  --  0.9   ------------------------------------------------------------------------------------------------------------------ estimated creatinine clearance is 34.5 mL/min (by C-G formula based on Cr of 1.08). ------------------------------------------------------------------------------------------------------------------ No results for input(s): HGBA1C in the last 72 hours. ------------------------------------------------------------------------------------------------------------------ No results for input(s): CHOL, HDL, LDLCALC, TRIG, CHOLHDL, LDLDIRECT in the last 72  hours. ------------------------------------------------------------------------------------------------------------------ No results for input(s): TSH, T4TOTAL, T3FREE, THYROIDAB in the last 72 hours.  Invalid input(s): FREET3 ------------------------------------------------------------------------------------------------------------------ No results for input(s): VITAMINB12, FOLATE, FERRITIN, TIBC, IRON, RETICCTPCT in the last 72 hours.  Coagulation profile No results for input(s): INR, PROTIME in the last 168 hours.  No results for input(s): DDIMER in the last 72 hours.  Cardiac Enzymes No results for input(s): CKMB, TROPONINI, MYOGLOBIN in the last 168 hours.  Invalid input(s): CK ------------------------------------------------------------------------------------------------------------------ Invalid input(s): POCBNP     Time Spent in minutes  30 minutes   ELGERGAWY, DAWOOD M.D on 09/01/2015 at 1:17 PM  Between 7am to 7pm - Pager - [phone]  After 7pm go to [email or web address] - password [facility]  [facility]   Office  [phone]

## 2015-09-01 NOTE — Progress Notes (Signed)
Utilization review complete. Alesia Shavis RN CCM Case Mgmt phone [phone] 

## 2015-09-01 NOTE — Consult Note (Signed)
Referring Provider: [facility] Primary Care Physician:  Peggyann Juba, NP Primary Gastroenterologist:  Dr. Henrene Pastor  Reason for Consultation:  SBO, exacerbation of Crohns   HPI: [name] is a 69 y.o. female with a past medical history of Crohn's disease, colonic polyps, uterine cancer, breast cancer, seizure disorder, and osteoporosis. She is status post ileocecectomy for her Crohn's. She had been followed for years by Dr. Verl Blalock. In July 2014 she developed abdominal pain and imaging was suggestive of Crohn's disease involving the small bowel from the duodenum to the ileum. In August 2014 she underwent an upper endoscopy and colonoscopy. The colonoscopy was normal with an unremarkable anastomosis. The anastomosis was narrow and the scope did not pass proximally. Biopsies were taken and revealed benign small bowel. Upper endoscopy was normal with no evidence of Crohn's. She had intermittent symptoms which were felt to be due to benign stricturing. In July 2015 she had an episode of right lower quadrant pain associated with some constipation and vomiting. This resolved in one or 2 days. She was seen by Dr. Henrene Pastor in January 2016 to establish care. It was felt that her intermittent short-lived right lower quadrant pain may be due to partial obstructive symptoms from adhesions or stenosis. She was instructed to use lactulose as needed for her constipation. She states she was doing fairly well until this past Friday when she developed severe right lower quadrant abdominal pain associated with some nausea. Her pain was described as severe cramps in the right lower quadrant. They persisted through the weekend and on Sunday she went to [facility] and had an x-ray that revealed small bowel obstruction. She was advised to come to [facility] where she could be evaluated by her gastroenterologist. CT of the abdomen and pelvis done in [facility] emergency room showed  there is a section of colon inflammation involving the ascending colon. There was inflammation of the distal small bowel for approximately 10-15 cm in length. Another section of small bowel appears thick-walled in the central to left central abdomen. No abscess. No free air. There is mild dilation of the proximal small bowel, which appears to be due to adynamic ileus. There is no convincing obstruction. She is also noted to have chronic intra-and extrahepatic bile duct dilatation which appears stable from prior scans. She has moved her bowels today she states it was mushy. She states over the past several days she has passed several mushy bowel movements but it is difficult to pass them. She denies bright red blood per rectum or melena.   Past Medical History  Diagnosis Date  . Osteoporosis, unspecified   . Seizure disorder   . Malignant neoplasm of breast (female), unspecified site     right  . Malignant neoplasm of corpus uteri, except isthmus   . Personal history of colonic polyps     hyperplastic  . Crohn's disease     Past Surgical History  Procedure Laterality Date  . Colon resection    . Abdominal hysterectomy    . Cholecystectomy    . Breast lumpectomy    . Elbow surgery      left    Prior to Admission medications   Medication Sig Start Date End Date Taking? Authorizing Provider  Acetaminophen (TYLENOL PO) Take 1 tablet by mouth at bedtime as needed (sleep).   Yes Historical Provider, MD  aspirin EC 81 MG tablet Take 81 mg by mouth daily.   Yes Historical Provider, MD  Calcium Carb-Cholecalciferol (CALCIUM 1000 + D PO) Take 1,000 mg by mouth 2 (two) times daily.   Yes Historical Provider, MD  Cholecalciferol (VITAMIN D) 1000 UNITS capsule Take 1,000 Units by mouth 2 (two) times daily.     Yes Historical Provider, MD  Cranberry 500 MG CAPS Take 500 mg by mouth daily.   Yes Historical Provider, MD  cyanocobalamin (,VITAMIN B-12,) 1000 MCG/ML injection Inject 1,000 mcg into the  muscle every 30 (thirty) days. Last injection August 03, 2015   Yes Historical Provider, MD  phenytoin (DILANTIN) 100 MG ER capsule Take 200 mg by mouth 2 (two) times daily.    Yes Historical Provider, MD  phenytoin (DILANTIN) 30 MG ER capsule Take 1 capsule at bedtime. Take together with Phenytoin 233m at bedtime for total of 2379mat bedtime Patient taking differently: Take 30 mg by mouth See admin instructions. Take 1 capsule (30 mg) daily at bedtime with 2 100 mg capsules for a 230 mg bedtime dose 04/21/15  Yes KaCameron SprangMD  potassium chloride SA (K-DUR,KLOR-CON) 20 MEQ tablet Take 40 mEq by mouth 2 (two) times daily.    Yes Historical Provider, MD    Current Facility-Administered Medications  Medication Dose Route Frequency Provider Last Rate Last Dose  . 0.9 %  sodium chloride infusion   Intravenous Continuous SaDebbe OdeaMD 125 mL/hr at 09/01/15 0541    . enoxaparin (LOVENOX) injection 40 mg  40 mg Subcutaneous Q24H SaDebbe OdeaMD   40 mg at 08/31/15 2140  . Influenza vac split quadrivalent PF (FLUARIX) injection 0.5 mL  0.5 mL Intramuscular Tomorrow-1000 Saima Rizwan, MD      . methylPREDNISolone sodium succinate (SOLU-MEDROL) 125 mg/2 mL injection 80 mg  80 mg Intravenous Q8H Saima Rizwan, MD   80 mg at 09/01/15 0540  . morphine 2 MG/ML injection 1-2 mg  1-2 mg Intravenous Q4H PRN SaDebbe OdeaMD      . ondansetron (ZOFRAN) tablet 4 mg  4 mg Oral Q6H PRN SaDebbe OdeaMD       Or  . ondansetron (ZOFRAN) injection 4 mg  4 mg Intravenous Q6H PRN SaDebbe OdeaMD      . phenytoin (DILANTIN) 200 mg in sodium chloride 0.9 % 100 mL IVPB  200 mg Intravenous Daily SaDebbe OdeaMD      . phenytoin (DILANTIN) 230 mg in sodium chloride 0.9 % 100 mL IVPB  230 mg Intravenous Daily SaDebbe OdeaMD   230 mg at 08/31/15 1828    Allergies as of 08/31/2015 - Review Complete 08/31/2015  Allergen Reaction Noted  . Ampicillin Diarrhea 08/31/2015  . Penicillins Diarrhea   . Pneumococcal vac  polyvalent Other (See Comments) 01/06/2015  . Tetanus toxoid Swelling     Family History  Problem Relation Age of Onset  . Breast cancer Maternal Aunt   . Heart disease Mother   . Aneurysm Father   . Colon cancer Neg Hx     Social History   Social History  . Marital Status: Married    Spouse Name: N/A  . Number of Children: 2  . Years of Education: N/A   Occupational History  . Retired    Social History Main Topics  . Smoking status: Never Smoker   . Smokeless tobacco: Never Used  . Alcohol Use: No  . Drug Use: No  . Sexual Activity: Yes    Birth Control/ Protection: None   Other Topics Concern  . Not on file  Social History Narrative    Review of Systems: .General: The patient denies anorexia, fever, weight loss Cardiac: Denies chest pain, syncope, palpitations, pedal edema  Respiratory: Denies cough, shortness of breath, wheezing GI: Denies severe indigestion/heartburn GU: Denies hematuria, incontinence, dysuria  Musculoskeletal: Denies arthritis  Skin: Denies suspicious skin lesions Neurologic: Denies focal weakness or numbness, change in vision Psychiatry: Denies depression or anxiety. Hematologic:+ easy bruising and bleeding  All other systems reviewed and found to be negative.   Physical Exam: Vital signs in last 24 hours: Temp:  [97.7 F (36.5 C)-98.9 F (37.2 C)] 97.7 F (36.5 C) (09/19 0528) Pulse Rate:  [72-83] 83 (09/19 0528) Resp:  [17-18] 18 (09/19 0528) BP: (107-114)/(55-65) 107/55 mmHg (09/19 0528) SpO2:  [96 %-98 %] 96 % (09/19 0528) Weight:  [100 lb 12.8 oz (45.723 kg)] 100 lb 12.8 oz (45.723 kg) (09/18 2247) Last BM Date: 09/01/15 General:   Alert,  Well-developed, well-nourished, pleasant and cooperative in NAD Head:  Normocephalic and atraumatic. Eyes:  Sclera clear, no icterus. Conjunctiva pink. Ears:  Normal auditory acuity. Nose:  No deformity, discharge, or lesions. Mouth:  No deformity or lesions.   Neck:  Supple; no  masses or thyromegaly. Lungs:  Clear throughout to auscultation.   No wheezes, crackles, or rhonchi.  Heart:  Regular rate and rhythm; no murmurs, clicks, rubs,  or gallops. Abdomen:  Soft,tender right lower quadrant with palpable thickening, no rebound or guarding, BS active,nonpalp hsm.   Msk:  Symmetrical without gross deformities. . Pulses:  Normal pulses noted. Extremities:  Without clubbing or edema. Neurologic: Alert and  oriented x4;  grossly normal neurologically. Skin:  Intact without significant lesions or rashes.. Psych:  Alert and cooperative. Normal mood and affect.  Intake/Output from previous day: 09/18 0701 - 09/19 0700 In: 1685.8 [P.O.:240; I.V.:1445.8] Out: 0  Intake/Output this shift:    Lab Results:  Recent Labs  08/31/15 1833  WBC 6.4  HGB 11.6*  HCT 34.6*  PLT 121*   BMET  Recent Labs  08/31/15 1833 09/01/15 0507  NA  --  133*  K  --  3.8  CL  --  102  CO2  --  23  GLUCOSE  --  95  BUN  --  10  CREATININE 1.02* 1.08*  CALCIUM  --  7.5*   LFT  Recent Labs  09/01/15 0507  PROT 5.1*  ALBUMIN 2.6*  AST 19  ALT 22  ALKPHOS 114  BILITOT 0.9     Studies/Results: Ct Abdomen Pelvis W Contrast  08/31/2015   CLINICAL DATA:  HPI: 69 year old with past medical history of Crohn's disease, remote ileocecectomy, was recently on 6-MP which was discontinued because of pancytopenia came into the ED at [facility] complaining about 2 days history of crampy abdominal pain, no bowel movements, nausea and vomiting 1. Abdominal x-rays showed proximal small bowel obstruction. Past medical Hx; Seizure Disorder; Colonic Polyps; Crohns Disease  EXAM: CT ABDOMEN AND PELVIS WITH CONTRAST  TECHNIQUE: Multidetector CT imaging of the abdomen and pelvis was performed using the standard protocol following bolus administration of intravenous contrast.  CONTRAST:  57m OMNIPAQUE IOHEXOL 300 MG/ML  SOLN  COMPARISON:  Current abdominal radiographs.  CT, 06/22/2013.   FINDINGS: Gastrointestinal: There is contrast throughout a mildly distended colon. There is an area focal narrowing of the ascending colon with associated wall thickening and adjacent mesenteric inflammation and a small amount of adjacent fluid. Wall measures 6 mm in thickness in this location. No other  colonic wall thickening. A portion of the inferior ascending colon extends to the left inguinal region lateral to the left external iliac vessels. This does not cause obstruction. The distal small bowel hat and for approximately 10-15 cm proximal to the ileocolic anastomosis is thick-walled with a maximal wall thickness of 7 mm. There is adjacent mesenteric inflammation and small amount of fluid. There is another area of small bowel wall thickening in the left central abdomen. Bowel proximal to this is mildly dilated with a maximum dilation of 3.2 cm.  Liver: Multiple intrahepatic dilated ducts could there is significant dilation of the common bile duct measuring 13 mm. There is relatively abrupt distal tapering in the pancreatic head. Mild prominence is noted of the pancreatic duct. These findings are stable from the prior CT. No liver mass or focal lesion.  Spleen: Borderline enlarged measuring 12 cm in greatest dimension. No splenic mass or focal lesion.  Adrenal glands: No masses.  Kidneys, ureters, bladder: Mild bilateral renal cortical thinning. Multiple bilateral low-density renal masses consistent with cysts. No hydronephrosis. Ureters normal course and caliber. Bladder is unremarkable.  Uterus and adnexa:  Uterus surgically absent.  No pelvic masses.  Lymph nodes:  No pathologically enlarged lymph nodes.  There is no free intraperitoneal air.  Ascites: Trace ascites is seen adjacent to the liver as well as adjacent to the inflamed right colon and distal small bowel.  Musculoskeletal: Diffusely demineralized bony structures. No osteoblastic or osteolytic lesions.  IMPRESSION: 1. Findings consistent with  active Crohn's disease. There is a section of colon inflammation involving the ascending colon. There is inflammation of the distal small bowel for approximately 10-15 cm in length. Another sections small bowel appears thick walled in the central to left central abdomen. No abscess. No free air. 2. There is mild dilation of proximal small bowel, which appears to be due to adynamic ileus. There is no convincing obstruction. 3. Trace amount of ascites. 4. Chronic intern extrahepatic bile duct dilation. 5. Borderline splenomegaly. 6. Multiple small renal cysts.  Status post hysterectomy.   Electronically Signed   By: Lajean Manes M.D.   On: 08/31/2015 21:12    IMPRESSION/PLAN: 68 year old female with a history of Crohn's disease admitted with small bowel obstruction. CT with a section of colon inflammation involving the ascending colon, and inflammation of the distal small bowel for approximately 10-15 cm in length. Another section of small bowel appears thick walled in the central and left abdomen. She was started on Solu-Medrol last evening and has less discomfort this morning. She has not vomited. She has moved her bowels today and has active bowel sounds. Will continue IV hydration, Solu-Medrol, and bowel rest. Will review colonoscopy plans with attending, though this may be able to be done as an outpatient. Will check QuantiFERON TB and hepatitis panel in anticipation of Biologics in the future. Will follow.    Hvozdovic, Deloris Ping 09/01/2015,  Pager [phone]

## 2015-09-02 DIAGNOSIS — K5080 Crohn's disease of both small and large intestine without complications: Secondary | ICD-10-CM

## 2015-09-02 LAB — BASIC METABOLIC PANEL
ANION GAP: 14 (ref 5–15)
BUN: 13 mg/dL (ref 6–20)
CHLORIDE: 107 mmol/L (ref 101–111)
CO2: 15 mmol/L — AB (ref 22–32)
CREATININE: 1.2 mg/dL — AB (ref 0.44–1.00)
Calcium: 7.2 mg/dL — ABNORMAL LOW (ref 8.9–10.3)
GFR calc non Af Amer: 44 mL/min — ABNORMAL LOW (ref >60)
GFR, EST AFRICAN AMERICAN: 51 mL/min — AB (ref >60)
Glucose, Bld: 76 mg/dL (ref 65–99)
POTASSIUM: 4.2 mmol/L (ref 3.5–5.1)
SODIUM: 136 mmol/L (ref 135–145)

## 2015-09-02 LAB — HEPATITIS B SURFACE ANTIBODY, QUANTITATIVE: Hepatitis B-Post: <3.1 m[IU]/mL — ABNORMAL LOW (ref >9.9)

## 2015-09-02 LAB — CBC
HEMATOCRIT: 32.6 % — AB (ref 36.0–46.0)
HEMOGLOBIN: 11 g/dL — AB (ref 12.0–15.0)
MCH: 31.5 pg (ref 26.0–34.0)
MCHC: 33.7 g/dL (ref 30.0–36.0)
MCV: 93.4 fL (ref 78.0–100.0)
Platelets: 100 10*3/uL — ABNORMAL LOW (ref 150–400)
RBC: 3.49 MIL/uL — AB (ref 3.87–5.11)
RDW: 14.7 % (ref 11.5–15.5)
WBC: 5.1 10*3/uL (ref 4.0–10.5)

## 2015-09-02 LAB — HEPATITIS A ANTIBODY, TOTAL: Hep A Total Ab: NEGATIVE

## 2015-09-02 LAB — HEPATITIS C ANTIBODY: HCV Ab: <0.1 s/co ratio (ref 0.0–0.9)

## 2015-09-02 MED ORDER — PHENYTOIN SODIUM EXTENDED 100 MG PO CAPS
230.0000 mg | ORAL_CAPSULE | Freq: Every day | ORAL | Status: DC
  Administered 2015-09-02: 230 mg via ORAL

## 2015-09-02 MED ORDER — ENOXAPARIN SODIUM 30 MG/0.3ML ~~LOC~~ SOLN
30.0000 mg | SUBCUTANEOUS | Status: DC
  Administered 2015-09-02 – 2015-09-03 (×2): 30 mg via SUBCUTANEOUS
  Filled 2015-09-02 (×2): qty 0.3

## 2015-09-02 MED ORDER — PHENYTOIN SODIUM EXTENDED 100 MG PO CAPS: 200.0000 mg | ORAL_CAPSULE | Freq: Two times a day (BID) | ORAL | Status: DC

## 2015-09-02 MED ORDER — MESALAMINE ER 250 MG PO CPCR
1000.0000 mg | ORAL_CAPSULE | Freq: Four times a day (QID) | ORAL | Status: DC
  Administered 2015-09-02 – 2015-09-04 (×8): 1000 mg via ORAL
  Filled 2015-09-02 (×10): qty 4

## 2015-09-02 MED ORDER — PHENYTOIN SODIUM EXTENDED 30 MG PO CAPS: 30.0000 mg | ORAL_CAPSULE | ORAL | Status: DC

## 2015-09-02 MED ORDER — PHENYTOIN SODIUM EXTENDED 100 MG PO CAPS
230.0000 mg | ORAL_CAPSULE | Freq: Every day | ORAL | Status: DC
  Administered 2015-09-02 – 2015-09-03 (×2): 230 mg via ORAL
  Filled 2015-09-02 (×3): qty 1

## 2015-09-02 MED ORDER — PHENYTOIN SODIUM EXTENDED 100 MG PO CAPS
230.0000 mg | ORAL_CAPSULE | Freq: Every day | ORAL | Status: DC
  Filled 2015-09-02: qty 1

## 2015-09-02 NOTE — Progress Notes (Signed)
Progress Note   Subjective  Feeling better though still with some lower abdominal cramping Abdominal pain overall much improved Feels hungry though not yet on liquid diet No fevers   Objective   Vital signs in last 24 hours: Temp:  [97.7 F (36.5 C)-98.4 F (36.9 C)] 98 F (36.7 C) (09/20 0910) Pulse Rate:  [75-91] 75 (09/20 0910) Resp:  [16-18] 16 (09/20 0910) BP: (91-118)/(45-59) 95/45 mmHg (09/20 0910) SpO2:  [97 %-99 %] 97 % (09/20 0910) Weight:  [97 lb 5 oz (44.14 kg)] 97 lb 5 oz (44.14 kg) (09/19 2049) Last BM Date: 09/02/15 Gen: awake, alert, NAD HEENT: anicteric, op clear CV: RRR, no mrg Pulm: CTA b/l Abd: soft, NT/ND, +BS throughout Ext: no c/c/e Neuro: nonfocal   Intake/Output from previous day: 09/19 0701 - 09/20 0700 In: 3869 [P.O.:640; I.V.:3125; IV Piggyback:104] Out: 1325 [Urine:1325] Intake/Output this shift: Total I/O In: 1468.2 [P.O.:260; I.V.:1104.2; IV Piggyback:104] Out: -   Lab Results:  Recent Labs  08/31/15 1833 09/02/15 0545  WBC 6.4 5.1  HGB 11.6* 11.0*  HCT 34.6* 32.6*  PLT 121* 100*   BMET  Recent Labs  08/31/15 1833 09/01/15 0507 09/02/15 0545  NA  --  133* 136  K  --  3.8 4.2  CL  --  102 107  CO2  --  23 15*  GLUCOSE  --  95 76  BUN  --  10 13  CREATININE 1.02* 1.08* 1.20*  CALCIUM  --  7.5* 7.2*   LFT  Recent Labs  09/01/15 0507  PROT 5.1*  ALBUMIN 2.6*  AST 19  ALT 22  ALKPHOS 114  BILITOT 0.9   PT/INR No results for input(s): LABPROT, INR in the last 72 hours.  Studies/Results: Ct Abdomen Pelvis W Contrast  08/31/2015   CLINICAL DATA:  HPI: 69 year old with past medical history of Crohn's disease, remote ileocecectomy, was recently on 6-MP which was discontinued because of pancytopenia came into the ED at [facility] complaining about 2 days history of crampy abdominal pain, no bowel movements, nausea and vomiting 1. Abdominal x-rays showed proximal small bowel obstruction. Past medical Hx;  Seizure Disorder; Colonic Polyps; Crohns Disease  EXAM: CT ABDOMEN AND PELVIS WITH CONTRAST  TECHNIQUE: Multidetector CT imaging of the abdomen and pelvis was performed using the standard protocol following bolus administration of intravenous contrast.  CONTRAST:  47m OMNIPAQUE IOHEXOL 300 MG/ML  SOLN  COMPARISON:  Current abdominal radiographs.  CT, 06/22/2013.  FINDINGS: Gastrointestinal: There is contrast throughout a mildly distended colon. There is an area focal narrowing of the ascending colon with associated wall thickening and adjacent mesenteric inflammation and a small amount of adjacent fluid. Wall measures 6 mm in thickness in this location. No other colonic wall thickening. A portion of the inferior ascending colon extends to the left inguinal region lateral to the left external iliac vessels. This does not cause obstruction. The distal small bowel hat and for approximately 10-15 cm proximal to the ileocolic anastomosis is thick-walled with a maximal wall thickness of 7 mm. There is adjacent mesenteric inflammation and small amount of fluid. There is another area of small bowel wall thickening in the left central abdomen. Bowel proximal to this is mildly dilated with a maximum dilation of 3.2 cm.  Liver: Multiple intrahepatic dilated ducts could there is significant dilation of the common bile duct measuring 13 mm. There is relatively abrupt distal tapering in the pancreatic head. Mild prominence is noted of the pancreatic  duct. These findings are stable from the prior CT. No liver mass or focal lesion.  Spleen: Borderline enlarged measuring 12 cm in greatest dimension. No splenic mass or focal lesion.  Adrenal glands: No masses.  Kidneys, ureters, bladder: Mild bilateral renal cortical thinning. Multiple bilateral low-density renal masses consistent with cysts. No hydronephrosis. Ureters normal course and caliber. Bladder is unremarkable.  Uterus and adnexa:  Uterus surgically absent.  No pelvic  masses.  Lymph nodes:  No pathologically enlarged lymph nodes.  There is no free intraperitoneal air.  Ascites: Trace ascites is seen adjacent to the liver as well as adjacent to the inflamed right colon and distal small bowel.  Musculoskeletal: Diffusely demineralized bony structures. No osteoblastic or osteolytic lesions.  IMPRESSION: 1. Findings consistent with active Crohn's disease. There is a section of colon inflammation involving the ascending colon. There is inflammation of the distal small bowel for approximately 10-15 cm in length. Another sections small bowel appears thick walled in the central to left central abdomen. No abscess. No free air. 2. There is mild dilation of proximal small bowel, which appears to be due to adynamic ileus. There is no convincing obstruction. 3. Trace amount of ascites. 4. Chronic intern extrahepatic bile duct dilation. 5. Borderline splenomegaly. 6. Multiple small renal cysts.  Status post hysterectomy.   Electronically Signed   By: Lajean Manes M.D.   On: 08/31/2015 21:12       Assessment / Plan:   69 year old female with history of Crohn's disease status post ileocecectomy admitted with active Crohn's flare as evidenced by symptoms and CT scan showing enteritis in the neoterminal ileum and ascending colon  1. Active Crohn's ileocolitis -- we'll continue IV Solu-Medrol today. Advance diet as tolerated. Clinically she is improving. Anticipate she will need escalation of therapy likely with biologic or immunomodulator.  We discussed colonoscopy, she would like to defer but is agreeable if felt absolutely necessary. Given history, symptoms and imaging consistent with Crohn's, we are treating as such and she is improving. Will hold on colonoscopy for now but I aspect she will need as an outpatient to reassess overall disease activity and response to therapy. --Not sure 5-ASA adds much in Crohn's, but low risk.  Trial of Pentasa 1000 mg QID   Principal Problem:    Bowel obstruction Active Problems:   Epilepsy, generalized, convulsive   Crohn disease   Thrombocytopenia     LOS: 2 days   PYRTLE, JAY M  09/02/2015, 4:12 PM

## 2015-09-02 NOTE — Progress Notes (Signed)
Patient Demographics  [name], is a 69 y.o. female, DOB - October 03, 1946, [id number]  Admit date - 08/31/2015   Admitting Physician Verlee Monte, MD  Outpatient Primary MD for the patient is Peggyann Juba, NP  LOS - 2   No chief complaint on file.      Admission HPI/Brief narrative:  Subjective:   [name] today has, No headache, No chest pain,No new weakness tingling or numbness,  abdominal pain significantly improved, denies any nausea or vomiting.  Assessment & Plan    Principal Problem:   Bowel obstruction Active Problems:   Epilepsy, generalized, convulsive   Crohn disease   Thrombocytopenia  Abdominal pain secondary to acute Crohn's flare - Patient presents with complaints of abdominal pain, CT abdomen showing evidence of active Crohn's disease, probable ileus, no evidence of obstruction - Regiment per gastroenterology, patient was advanced to clear liquid diet today, started on trial of mesalamine, and advance as tolerated, plan is to continue with IV steroids today,  continue with  pain and nausea medication as needed ,GI will check QuantiFERON TB and hepatitis panel in anticipation of biologic agent in the funo plan for colonoscopy during this admission,   History of seizures - Continue Dilantin  Thrombocytopenia - Mild, no evidence of active bleed, monitor closely    Code Status: Full  Family Communication: husband at bedside  Disposition Plan: home when stable   Procedures  None   Consults   Gastroenterology   Medications  Scheduled Meds: . enoxaparin (LOVENOX) injection  30 mg Subcutaneous Q24H  . mesalamine  1,000 mg Oral QID  . methylPREDNISolone (SOLU-MEDROL) injection  40 mg Intravenous Daily  . [START ON 09/03/2015] pantoprazole  40 mg Oral QAC breakfast  . phenytoin (DILANTIN) IV  200 mg Intravenous Daily  . phenytoin (DILANTIN) IV   230 mg Intravenous Daily   Continuous Infusions: . sodium chloride 125 mL/hr at 09/02/15 1550   PRN Meds:.morphine injection, ondansetron **OR** ondansetron (ZOFRAN) IV  DVT Prophylaxis  Lovenox   Lab Results  Component Value Date   PLT 100* 09/02/2015    Antibiotics    Anti-infectives    None          Objective:   Filed Vitals:   09/01/15 2049 09/02/15 0443 09/02/15 0910 09/02/15 1626  BP: 118/59 91/48 95/45  101/58  Pulse: 91 79 75 70  Temp: 97.8 F (36.6 C) 97.7 F (36.5 C) 98 F (36.7 C) 97.7 F (36.5 C)  TempSrc: Oral Oral Oral Oral  Resp: 17 18 16 17   Height: 5' 2"  (1.575 m)     Weight: 44.14 kg (97 lb 5 oz)     SpO2: 98% 99% 97% 99%    Wt Readings from Last 3 Encounters:  09/01/15 44.14 kg (97 lb 5 oz)  04/21/15 45.36 kg (100 lb)  01/06/15 44.566 kg (98 lb 4 oz)     Intake/Output Summary (Last 24 hours) at 09/02/15 1725 Last data filed at 09/02/15 1550  Gross per 24 hour  Intake 4051.09 ml  Output    425 ml  Net 3626.09 ml     Physical Exam  Awake Alert, Oriented X 3, No new F.N deficits,  [place].AT,PERRAL Supple Neck,No JVD,  Symmetrical Chest wall movement,  Good air movement bilaterally,  RRR,No Gallops,Rubs or new Murmurs, No Parasternal Heave +ve B.Sounds, Abd Soft, minimal tenderness in RLQ , No rebound - guarding or rigidity. No Cyanosis, Clubbing or edema, No new Rash or bruise     Data Review   Micro Results No results found for this or any previous visit (from the past 240 hour(s)).  Radiology Reports Ct Abdomen Pelvis W Contrast  08/31/2015   CLINICAL DATA:  HPI: 69 year old with past medical history of Crohn's disease, remote ileocecectomy, was recently on 6-MP which was discontinued because of pancytopenia came into the ED at [facility] complaining about 2 days history of crampy abdominal pain, no bowel movements, nausea and vomiting 1. Abdominal x-rays showed proximal small bowel obstruction. Past medical Hx; Seizure  Disorder; Colonic Polyps; Crohns Disease  EXAM: CT ABDOMEN AND PELVIS WITH CONTRAST  TECHNIQUE: Multidetector CT imaging of the abdomen and pelvis was performed using the standard protocol following bolus administration of intravenous contrast.  CONTRAST:  78m OMNIPAQUE IOHEXOL 300 MG/ML  SOLN  COMPARISON:  Current abdominal radiographs.  CT, 06/22/2013.  FINDINGS: Gastrointestinal: There is contrast throughout a mildly distended colon. There is an area focal narrowing of the ascending colon with associated wall thickening and adjacent mesenteric inflammation and a small amount of adjacent fluid. Wall measures 6 mm in thickness in this location. No other colonic wall thickening. A portion of the inferior ascending colon extends to the left inguinal region lateral to the left external iliac vessels. This does not cause obstruction. The distal small bowel hat and for approximately 10-15 cm proximal to the ileocolic anastomosis is thick-walled with a maximal wall thickness of 7 mm. There is adjacent mesenteric inflammation and small amount of fluid. There is another area of small bowel wall thickening in the left central abdomen. Bowel proximal to this is mildly dilated with a maximum dilation of 3.2 cm.  Liver: Multiple intrahepatic dilated ducts could there is significant dilation of the common bile duct measuring 13 mm. There is relatively abrupt distal tapering in the pancreatic head. Mild prominence is noted of the pancreatic duct. These findings are stable from the prior CT. No liver mass or focal lesion.  Spleen: Borderline enlarged measuring 12 cm in greatest dimension. No splenic mass or focal lesion.  Adrenal glands: No masses.  Kidneys, ureters, bladder: Mild bilateral renal cortical thinning. Multiple bilateral low-density renal masses consistent with cysts. No hydronephrosis. Ureters normal course and caliber. Bladder is unremarkable.  Uterus and adnexa:  Uterus surgically absent.  No pelvic masses.   Lymph nodes:  No pathologically enlarged lymph nodes.  There is no free intraperitoneal air.  Ascites: Trace ascites is seen adjacent to the liver as well as adjacent to the inflamed right colon and distal small bowel.  Musculoskeletal: Diffusely demineralized bony structures. No osteoblastic or osteolytic lesions.  IMPRESSION: 1. Findings consistent with active Crohn's disease. There is a section of colon inflammation involving the ascending colon. There is inflammation of the distal small bowel for approximately 10-15 cm in length. Another sections small bowel appears thick walled in the central to left central abdomen. No abscess. No free air. 2. There is mild dilation of proximal small bowel, which appears to be due to adynamic ileus. There is no convincing obstruction. 3. Trace amount of ascites. 4. Chronic intern extrahepatic bile duct dilation. 5. Borderline splenomegaly. 6. Multiple small renal cysts.  Status post hysterectomy.   Electronically Signed   By: DDedra SkeensD.  On: 08/31/2015 21:12     CBC  Recent Labs Lab 08/31/15 1833 09/02/15 0545  WBC 6.4 5.1  HGB 11.6* 11.0*  HCT 34.6* 32.6*  PLT 121* 100*  MCV 93.0 93.4  MCH 31.2 31.5  MCHC 33.5 33.7  RDW 14.8 14.7    Chemistries   Recent Labs Lab 08/31/15 1833 09/01/15 0507 09/02/15 0545  NA  --  133* 136  K  --  3.8 4.2  CL  --  102 107  CO2  --  23 15*  GLUCOSE  --  95 76  BUN  --  10 13  CREATININE 1.02* 1.08* 1.20*  CALCIUM  --  7.5* 7.2*  AST  --  19  --   ALT  --  22  --   ALKPHOS  --  114  --   BILITOT  --  0.9  --    ------------------------------------------------------------------------------------------------------------------ estimated creatinine clearance is 29.9 mL/min (by C-G formula based on Cr of 1.2). ------------------------------------------------------------------------------------------------------------------ No results for input(s): HGBA1C in the last 72  hours. ------------------------------------------------------------------------------------------------------------------ No results for input(s): CHOL, HDL, LDLCALC, TRIG, CHOLHDL, LDLDIRECT in the last 72 hours. ------------------------------------------------------------------------------------------------------------------ No results for input(s): TSH, T4TOTAL, T3FREE, THYROIDAB in the last 72 hours.  Invalid input(s): FREET3 ------------------------------------------------------------------------------------------------------------------ No results for input(s): VITAMINB12, FOLATE, FERRITIN, TIBC, IRON, RETICCTPCT in the last 72 hours.  Coagulation profile No results for input(s): INR, PROTIME in the last 168 hours.  No results for input(s): DDIMER in the last 72 hours.  Cardiac Enzymes No results for input(s): CKMB, TROPONINI, MYOGLOBIN in the last 168 hours.  Invalid input(s): CK ------------------------------------------------------------------------------------------------------------------ Invalid input(s): POCBNP     Time Spent in minutes  30 minutes   ELGERGAWY, DAWOOD M.D on 09/02/2015 at 5:25 PM  Between 7am to 7pm - Pager - [phone]  After 7pm go to [email or web address] - password [email or web address]  [facility]   Office  [phone]

## 2015-09-03 DIAGNOSIS — K5090 Crohn's disease, unspecified, without complications: Secondary | ICD-10-CM

## 2015-09-03 DIAGNOSIS — N289 Disorder of kidney and ureter, unspecified: Secondary | ICD-10-CM

## 2015-09-03 DIAGNOSIS — K50112 Crohn's disease of large intestine with intestinal obstruction: Secondary | ICD-10-CM

## 2015-09-03 DIAGNOSIS — G40909 Epilepsy, unspecified, not intractable, without status epilepticus: Secondary | ICD-10-CM

## 2015-09-03 LAB — QUANTIFERON IN TUBE
QUANTIFERON MITOGEN VALUE: 0.77 [IU]/mL
QUANTIFERON NIL VALUE: 0.02 [IU]/mL
QUANTIFERON TB AG VALUE: 0.01 [IU]/mL
QUANTIFERON TB GOLD: NEGATIVE

## 2015-09-03 LAB — BASIC METABOLIC PANEL
ANION GAP: 6 (ref 5–15)
BUN: 8 mg/dL (ref 6–20)
CHLORIDE: 113 mmol/L — AB (ref 101–111)
CO2: 20 mmol/L — AB (ref 22–32)
CREATININE: 0.91 mg/dL (ref 0.44–1.00)
Calcium: 7.2 mg/dL — ABNORMAL LOW (ref 8.9–10.3)
GFR calc non Af Amer: >60 mL/min (ref >60)
Glucose, Bld: 89 mg/dL (ref 65–99)
Potassium: 3.3 mmol/L — ABNORMAL LOW (ref 3.5–5.1)
Sodium: 139 mmol/L (ref 135–145)

## 2015-09-03 LAB — HIGH SENSITIVITY CRP: CRP HIGH SENSITIVITY: 36.98 mg/L — AB (ref 0.00–3.00)

## 2015-09-03 LAB — CBC
HEMATOCRIT: 29.4 % — AB (ref 36.0–46.0)
HEMOGLOBIN: 9.8 g/dL — AB (ref 12.0–15.0)
MCH: 30.8 pg (ref 26.0–34.0)
MCHC: 33.3 g/dL (ref 30.0–36.0)
MCV: 92.5 fL (ref 78.0–100.0)
Platelets: 120 10*3/uL — ABNORMAL LOW (ref 150–400)
RBC: 3.18 MIL/uL — ABNORMAL LOW (ref 3.87–5.11)
RDW: 14.8 % (ref 11.5–15.5)
WBC: 2.7 10*3/uL — ABNORMAL LOW (ref 4.0–10.5)

## 2015-09-03 LAB — QUANTIFERON TB GOLD ASSAY (BLOOD)

## 2015-09-03 MED ORDER — PHENYTOIN SODIUM EXTENDED 100 MG PO CAPS
200.0000 mg | ORAL_CAPSULE | Freq: Every day | ORAL | Status: DC
  Administered 2015-09-03 – 2015-09-04 (×2): 200 mg via ORAL
  Filled 2015-09-03 (×2): qty 2

## 2015-09-03 MED ORDER — PREDNISONE 20 MG PO TABS
40.0000 mg | ORAL_TABLET | Freq: Every day | ORAL | Status: DC
  Administered 2015-09-04: 40 mg via ORAL
  Filled 2015-09-03: qty 2

## 2015-09-03 NOTE — Progress Notes (Signed)
Patient Demographics  [name], is a 69 y.o. female, DOB - 1946-08-21, TKW:[id number]  Admit date - 08/31/2015   Admitting Physician Verlee Monte, MD  Outpatient Primary MD for the patient is Peggyann Juba, NP  LOS - 3   No chief complaint on file.      Admission HPI/Brief narrative:  Subjective:   [name] reported feeling much better,  abdominal pain significantly improved, denies any nausea or vomiting, tolerating diet, still have diarrhea, husband and son in room  Assessment & Plan    Principal Problem:   Bowel obstruction Active Problems:   Epilepsy, generalized, convulsive   Crohn disease   Thrombocytopenia  Abdominal pain secondary to acute Crohn's flare - Patient presents with complaints of abdominal pain, CT abdomen showing evidence of active Crohn's disease, probable ileus, no evidence of obstruction - Regiment per gastroenterology,  started on trial of mesalamine, and  IV steroids GI will check QuantiFERON TB and hepatitis panel in anticipation of biologic agent in the funo plan for colonoscopy during this admission,  -tolerating diet advancement, GI switched steroids to oral, likely able to discharge tomorrow if continue to improve  Acute renal impairment: cr 1.2, now 0.9 with hydration, continue hydration, ua pending.  History of seizures - Continue Dilantin  Thrombocytopenia - Mild, no evidence of active bleed, monitor closely    Code Status: Full  Family Communication: husband and son at bedside  Disposition Plan: home likely tomorrow   Procedures  None   Consults   Gastroenterology   Medications  Scheduled Meds: . enoxaparin (LOVENOX) injection  30 mg Subcutaneous Q24H  . mesalamine  1,000 mg Oral QID  . pantoprazole  40 mg Oral QAC breakfast  . phenytoin  200 mg Oral Daily  . phenytoin  230 mg Oral QHS  . [START ON 09/04/2015]  predniSONE  40 mg Oral Q breakfast   Continuous Infusions: . sodium chloride 75 mL/hr at 09/02/15 1748   PRN Meds:.morphine injection, ondansetron **OR** ondansetron (ZOFRAN) IV  DVT Prophylaxis  Lovenox   Lab Results  Component Value Date   PLT 120* 09/03/2015    Antibiotics    Anti-infectives    None          Objective:   Filed Vitals:   09/02/15 2038 09/03/15 0403 09/03/15 0805 09/03/15 1623  BP: 96/51 104/54 100/55 119/71  Pulse: 77 81 85 86  Temp: 97.8 F (36.6 C) 98.2 F (36.8 C) 98 F (36.7 C) 98 F (36.7 C)  TempSrc: Oral Oral Oral Oral  Resp: 18  17 17   Height:      Weight: 99 lb 13.9 oz (45.3 kg)     SpO2: 97% 96% 99% 100%    Wt Readings from Last 3 Encounters:  09/02/15 99 lb 13.9 oz (45.3 kg)  04/21/15 100 lb (45.36 kg)  01/06/15 98 lb 4 oz (44.566 kg)     Intake/Output Summary (Last 24 hours) at 09/03/15 1642 Last data filed at 09/03/15 1400  Gross per 24 hour  Intake 2940.83 ml  Output   1000 ml  Net 1940.83 ml     Physical Exam  Awake Alert, Oriented X 3, No new F.N deficits,  [place].AT,PERRAL Supple Neck,No JVD,  Symmetrical Chest wall  movement, Good air movement bilaterally,  RRR,No Gallops,Rubs or new Murmurs, No Parasternal Heave +ve B.Sounds, Abd Soft, minimal tenderness in RLQ , No rebound - guarding or rigidity. No Cyanosis, Clubbing or edema, No new Rash or bruise     Data Review   Micro Results No results found for this or any previous visit (from the past 240 hour(s)).  Radiology Reports Ct Abdomen Pelvis W Contrast  08/31/2015   CLINICAL DATA:  HPI: 69 year old with past medical history of Crohn's disease, remote ileocecectomy, was recently on 6-MP which was discontinued because of pancytopenia came into the ED at [facility] complaining about 2 days history of crampy abdominal pain, no bowel movements, nausea and vomiting 1. Abdominal x-rays showed proximal small bowel obstruction. Past medical Hx; Seizure  Disorder; Colonic Polyps; Crohns Disease  EXAM: CT ABDOMEN AND PELVIS WITH CONTRAST  TECHNIQUE: Multidetector CT imaging of the abdomen and pelvis was performed using the standard protocol following bolus administration of intravenous contrast.  CONTRAST:  32m OMNIPAQUE IOHEXOL 300 MG/ML  SOLN  COMPARISON:  Current abdominal radiographs.  CT, 06/22/2013.  FINDINGS: Gastrointestinal: There is contrast throughout a mildly distended colon. There is an area focal narrowing of the ascending colon with associated wall thickening and adjacent mesenteric inflammation and a small amount of adjacent fluid. Wall measures 6 mm in thickness in this location. No other colonic wall thickening. A portion of the inferior ascending colon extends to the left inguinal region lateral to the left external iliac vessels. This does not cause obstruction. The distal small bowel hat and for approximately 10-15 cm proximal to the ileocolic anastomosis is thick-walled with a maximal wall thickness of 7 mm. There is adjacent mesenteric inflammation and small amount of fluid. There is another area of small bowel wall thickening in the left central abdomen. Bowel proximal to this is mildly dilated with a maximum dilation of 3.2 cm.  Liver: Multiple intrahepatic dilated ducts could there is significant dilation of the common bile duct measuring 13 mm. There is relatively abrupt distal tapering in the pancreatic head. Mild prominence is noted of the pancreatic duct. These findings are stable from the prior CT. No liver mass or focal lesion.  Spleen: Borderline enlarged measuring 12 cm in greatest dimension. No splenic mass or focal lesion.  Adrenal glands: No masses.  Kidneys, ureters, bladder: Mild bilateral renal cortical thinning. Multiple bilateral low-density renal masses consistent with cysts. No hydronephrosis. Ureters normal course and caliber. Bladder is unremarkable.  Uterus and adnexa:  Uterus surgically absent.  No pelvic masses.   Lymph nodes:  No pathologically enlarged lymph nodes.  There is no free intraperitoneal air.  Ascites: Trace ascites is seen adjacent to the liver as well as adjacent to the inflamed right colon and distal small bowel.  Musculoskeletal: Diffusely demineralized bony structures. No osteoblastic or osteolytic lesions.  IMPRESSION: 1. Findings consistent with active Crohn's disease. There is a section of colon inflammation involving the ascending colon. There is inflammation of the distal small bowel for approximately 10-15 cm in length. Another sections small bowel appears thick walled in the central to left central abdomen. No abscess. No free air. 2. There is mild dilation of proximal small bowel, which appears to be due to adynamic ileus. There is no convincing obstruction. 3. Trace amount of ascites. 4. Chronic intern extrahepatic bile duct dilation. 5. Borderline splenomegaly. 6. Multiple small renal cysts.  Status post hysterectomy.   Electronically Signed   By: DDedra SkeensD.  On: 08/31/2015 21:12     CBC  Recent Labs Lab 08/31/15 1833 09/02/15 0545 09/03/15 0400  WBC 6.4 5.1 2.7*  HGB 11.6* 11.0* 9.8*  HCT 34.6* 32.6* 29.4*  PLT 121* 100* 120*  MCV 93.0 93.4 92.5  MCH 31.2 31.5 30.8  MCHC 33.5 33.7 33.3  RDW 14.8 14.7 14.8    Chemistries   Recent Labs Lab 08/31/15 1833 09/01/15 0507 09/02/15 0545 09/03/15 0400  NA  --  133* 136 139  K  --  3.8 4.2 3.3*  CL  --  102 107 113*  CO2  --  23 15* 20*  GLUCOSE  --  95 76 89  BUN  --  10 13 8   CREATININE 1.02* 1.08* 1.20* 0.91  CALCIUM  --  7.5* 7.2* 7.2*  AST  --  19  --   --   ALT  --  22  --   --   ALKPHOS  --  114  --   --   BILITOT  --  0.9  --   --    ------------------------------------------------------------------------------------------------------------------ estimated creatinine clearance is 40.6 mL/min (by C-G formula based on Cr of  0.91). ------------------------------------------------------------------------------------------------------------------ No results for input(s): HGBA1C in the last 72 hours. ------------------------------------------------------------------------------------------------------------------ No results for input(s): CHOL, HDL, LDLCALC, TRIG, CHOLHDL, LDLDIRECT in the last 72 hours. ------------------------------------------------------------------------------------------------------------------ No results for input(s): TSH, T4TOTAL, T3FREE, THYROIDAB in the last 72 hours.  Invalid input(s): FREET3 ------------------------------------------------------------------------------------------------------------------ No results for input(s): VITAMINB12, FOLATE, FERRITIN, TIBC, IRON, RETICCTPCT in the last 72 hours.  Coagulation profile No results for input(s): INR, PROTIME in the last 168 hours.  No results for input(s): DDIMER in the last 72 hours.  Cardiac Enzymes No results for input(s): CKMB, TROPONINI, MYOGLOBIN in the last 168 hours.  Invalid input(s): CK ------------------------------------------------------------------------------------------------------------------ Invalid input(s): POCBNP     Time Spent in minutes  64 minutes   Xu,Fang M.D on 09/03/2015 at 4:42 PM  Between 7am to 7pm - Pager - [phone]  After 7pm go to [email or web address] - password [facility]  [facility]   Office  [phone]

## 2015-09-03 NOTE — Progress Notes (Signed)
     [facility] Progress Note  Subjective:   WBC 2.7, Hgb 9.8. Less pain. No vomiting. Stools loose.Tol soft diet.   Objective:  Vital signs in last 24 hours: Temp:  [97.7 F (36.5 C)-98.2 F (36.8 C)] 98 F (36.7 C) (09/21 0805) Pulse Rate:  [70-85] 85 (09/21 0805) Resp:  [17-18] 17 (09/21 0805) BP: (96-104)/(51-58) 100/55 mmHg (09/21 0805) SpO2:  [96 %-99 %] 99 % (09/21 0805) Weight:  [99 lb 13.9 oz (45.3 kg)] 99 lb 13.9 oz (45.3 kg) (09/20 2038) Last BM Date: 09/02/15 General:   Alert,  Well-developed,    in NAD HEENT: anicteric, op clear CV: RRR, no mrg Pulm: CTA b/l Abd: soft, NT/ND, +BS throughout Ext: no c/c/e Neuro: nonfocal  Intake/Output from previous day: 09/20 0701 - 09/21 0700 In: 2989 [P.O.:620; I.V.:2265; IV Piggyback:104] Out: 750 [Urine:750] Intake/Output this shift: Total I/O In: 1420 [P.O.:820; I.V.:600] Out: 250 [Urine:250]  Lab Results:  Recent Labs  08/31/15 1833 09/02/15 0545 09/03/15 0400  WBC 6.4 5.1 2.7*  HGB 11.6* 11.0* 9.8*  HCT 34.6* 32.6* 29.4*  PLT 121* 100* 120*   BMET  Recent Labs  09/01/15 0507 09/02/15 0545 09/03/15 0400  NA 133* 136 139  K 3.8 4.2 3.3*  CL 102 107 113*  CO2 23 15* 20*  GLUCOSE 95 76 89  BUN 10 13 8   CREATININE 1.08* 1.20* 0.91  CALCIUM 7.5* 7.2* 7.2*      Hepatitis Panel  Recent Labs  09/01/15 1003  HCVAB <0.1    No results found.  ASSESSMENT/PLAN:   58 to female with active Crohns ileocolitis. Will change over to po steroid. Improving.  Will need colonoscopy as outpatient to assess disease activity and response to therapy. May be able to go home in a.m. On po prednisone 40 mg til seen in office in 2 weeks, pentasa 100 mg qid, zofran as needed.( Has appt in GI office Sep 17, 2015).  Principal Problem:  Bowel obstruction Active Problems:  Epilepsy, generalized, convulsive  Crohn disease  Thrombocytopenia   LOS: 3 days   Ellora Varnum, Vita Barley PA-C  09/03/2015, Pager [phone]

## 2015-09-03 NOTE — Care Management Important Message (Signed)
Important Message  Patient Details  Name: [name] MRN: [id number] Date of Birth: 05-28-1946   Medicare Important Message Given:  Yes-second notification given    Delorse Lek 09/03/2015, 11:55 AM

## 2015-09-04 ENCOUNTER — Other Ambulatory Visit: Payer: Self-pay | Admitting: Oncology

## 2015-09-04 DIAGNOSIS — D709 Neutropenia, unspecified: Secondary | ICD-10-CM

## 2015-09-04 DIAGNOSIS — D696 Thrombocytopenia, unspecified: Secondary | ICD-10-CM

## 2015-09-04 DIAGNOSIS — E876 Hypokalemia: Secondary | ICD-10-CM

## 2015-09-04 LAB — COMPREHENSIVE METABOLIC PANEL
ALK PHOS: 83 U/L (ref 38–126)
ALT: 16 U/L (ref 14–54)
ANION GAP: 6 (ref 5–15)
AST: 20 U/L (ref 15–41)
Albumin: 2.5 g/dL — ABNORMAL LOW (ref 3.5–5.0)
BILIRUBIN TOTAL: 0.3 mg/dL (ref 0.3–1.2)
CALCIUM: 7.2 mg/dL — AB (ref 8.9–10.3)
CO2: 20 mmol/L — AB (ref 22–32)
Chloride: 112 mmol/L — ABNORMAL HIGH (ref 101–111)
Creatinine, Ser: 0.82 mg/dL (ref 0.44–1.00)
GFR calc Af Amer: >60 mL/min (ref >60)
GFR calc non Af Amer: >60 mL/min (ref >60)
GLUCOSE: 99 mg/dL (ref 65–99)
POTASSIUM: 2.6 mmol/L — AB (ref 3.5–5.1)
Sodium: 138 mmol/L (ref 135–145)
Total Protein: 4.8 g/dL — ABNORMAL LOW (ref 6.5–8.1)

## 2015-09-04 LAB — CBC WITH DIFFERENTIAL/PLATELET
Basophils Absolute: 0 10*3/uL (ref 0.0–0.1)
Basophils Relative: 1 %
EOS PCT: 1 %
Eosinophils Absolute: 0 10*3/uL (ref 0.0–0.7)
HEMATOCRIT: 29.8 % — AB (ref 36.0–46.0)
Hemoglobin: 10.1 g/dL — ABNORMAL LOW (ref 12.0–15.0)
LYMPHS PCT: 29 %
Lymphs Abs: 0.5 10*3/uL — ABNORMAL LOW (ref 0.7–4.0)
MCH: 31.2 pg (ref 26.0–34.0)
MCHC: 33.9 g/dL (ref 30.0–36.0)
MCV: 92 fL (ref 78.0–100.0)
MONO ABS: 0.1 10*3/uL (ref 0.1–1.0)
MONOS PCT: 7 %
NEUTROS ABS: 1.1 10*3/uL — AB (ref 1.7–7.7)
Neutrophils Relative %: 63 %
PLATELETS: 115 10*3/uL — AB (ref 150–400)
RBC: 3.24 MIL/uL — ABNORMAL LOW (ref 3.87–5.11)
RDW: 14.9 % (ref 11.5–15.5)
WBC: 1.8 10*3/uL — ABNORMAL LOW (ref 4.0–10.5)

## 2015-09-04 LAB — URINE MICROSCOPIC-ADD ON

## 2015-09-04 LAB — URINALYSIS, ROUTINE W REFLEX MICROSCOPIC
BILIRUBIN URINE: NEGATIVE
GLUCOSE, UA: NEGATIVE mg/dL
Ketones, ur: NEGATIVE mg/dL
Nitrite: NEGATIVE
PH: 6 (ref 5.0–8.0)
Protein, ur: NEGATIVE mg/dL
SPECIFIC GRAVITY, URINE: 1.007 (ref 1.005–1.030)
Urobilinogen, UA: 0.2 mg/dL (ref 0.0–1.0)

## 2015-09-04 LAB — MAGNESIUM: MAGNESIUM: 1.5 mg/dL — AB (ref 1.7–2.4)

## 2015-09-04 LAB — HEPATITIS B SURFACE ANTIGEN: HEP B S AG: NEGATIVE

## 2015-09-04 MED ORDER — ONDANSETRON HCL 4 MG PO TABS: 4.0000 mg | ORAL_TABLET | Freq: Four times a day (QID) | ORAL | Status: DC | PRN

## 2015-09-04 MED ORDER — POTASSIUM CHLORIDE CRYS ER 20 MEQ PO TBCR
40.0000 meq | EXTENDED_RELEASE_TABLET | ORAL | Status: AC
  Administered 2015-09-04 (×2): 40 meq via ORAL
  Filled 2015-09-04 (×2): qty 2

## 2015-09-04 MED ORDER — PREDNISONE 20 MG PO TABS
40.0000 mg | ORAL_TABLET | Freq: Every day | ORAL | Status: DC
Start: 2015-09-04 — End: 2015-09-17

## 2015-09-04 MED ORDER — MAGNESIUM SULFATE 2 GM/50ML IV SOLN
2.0000 g | Freq: Once | INTRAVENOUS | Status: AC
  Administered 2015-09-04: 2 g via INTRAVENOUS
  Filled 2015-09-04: qty 50

## 2015-09-04 MED ORDER — MESALAMINE ER 250 MG PO CPCR
1000.0000 mg | ORAL_CAPSULE | Freq: Four times a day (QID) | ORAL | Status: DC
Start: 2015-09-04 — End: 2015-09-17

## 2015-09-04 NOTE — Progress Notes (Signed)
Patient Discharge:  Disposition: Pt discharged home with husband  Education: Pt educated on medications, follow up appointments, and all discharge instructions. Pt verbalized understanding.   IV: Removed  Telemetry: N/A  Follow-up appointments: Reviewed with pt.   Prescriptions: Scripts sent to pt's pharmacy  Transportation: Transported home by husband.  Belongings: All belongings taken with pt.

## 2015-09-04 NOTE — Discharge Summary (Signed)
Discharge Summary  [name] MEQ:[id number] DOB: [birth year]  PCP: Nicole Juba, NP  Admit date: 08/31/2015 Discharge date: 09/04/2015  Time spent: <13mns  Recommendations for Outpatient Follow-up:  1. F/u with PMD within a week for hospital discharge follow up, repeat cbc/bmp/mag at follow up, pmd to refer patient to hematology for neutropenia (patient reported pmd is already in the process of referring her to hematology for neutropenia) 2. F/u with GI on oct 5 at 1pm for crohn's disease  Discharge Diagnoses:  Active Hospital Problems   Diagnosis Date Noted  . Bowel obstruction 08/31/2015  . Crohn disease 08/31/2015  . Thrombocytopenia 08/31/2015  . Epilepsy, generalized, convulsive 04/21/2015    Resolved Hospital Problems   Diagnosis Date Noted Date Resolved  . Crohn's disease of large intestine with intestinal obstruction 08/03/2011 08/31/2015  . History of digestive system disease 12/03/2009 08/31/2015    Discharge Condition: stable  Diet recommendation: soft diet, advance as tolerated  Filed Weights   09/01/15 2049 09/02/15 2038 09/03/15 2009  Weight: 97 lb 5 oz (44.14 kg) 99 lb 13.9 oz (45.3 kg) 100 lb 3.2 oz (45.45 kg)    History of present illness:  ASHAMRA BRADEENis a 69y.o. [facility]. She has a history of Crohn's disease status post ileocolectomy about 20 years ago, seizure disorder, history of breast cancer (treated), history of uterine cancer status post hysterectomy. The patient presents with 2 days of abdominal pain. Pain is mostly in her right lower quadrant and is crampy in nature. Last night she ate dinner without trouble, however, this morning she was not able to eat and vomited. She has not tried to eat since then. Pain improved with morphine given in the ER. Abdomen is distended. She had a bowel movement yesterday which she says was a small liquid one.  She had a normal bowel movement the day before. No blood in stool No fever chills or sweats. No further vomiting today. She states that she has not been on medication for Crohn's disease in over 3 years. She has not had any flares in past 3 years.  Hospital Course:  Principal Problem:   Bowel obstruction Active Problems:   Epilepsy, generalized, convulsive   Crohn disease   Thrombocytopenia  Abdominal pain secondary to acute Crohn's flare - Patient presents with complaints of abdominal pain, CT abdomen showing evidence of active Crohn's disease, probable ileus, no evidence of obstruction - started on trial of mesalamine, and steroids per GI recommendation -QuantiFERON TB and hepatitis panel  in anticipation of biologic agent, result pending -plan for outpatient colonoscopy  To assess disease activity and response to current therapy. -tolerating diet advancement, GI cleared patient to discharge home with steroids/mesalamine.  Hypomagnesemia:  IV mag replacement in the hospital  Hypokalemia: oral potassium supplement  Acute renal insufficiency: cr 1.2, now 0.82 with hydration with hydration, ua unremarkable.  History of seizures - Continue Dilantin, ( dilantin could contribute to mild  Thrombocytopenia and neutropenia, pmd to repeat cbc, hematology referral already in the process, consider neurology referral to discuss medication adjustment for history of seizure)   Thrombocytopenia - Mild, no evidence of active bleed, monitor closely  Neutropenia: ANC >1000, hematology outpatient follow up.   Code Status: Full  Family Communication: husband at bedside  Disposition Plan: home    Procedures  None   Consults  Gastroenterology    Discharge Exam: BP 113/64 mmHg  Pulse  78  Temp(Src) 98.9 F (37.2 C) (Oral)  Resp 18  Ht 5' 2"  (1.575 m)  Wt 100 lb 3.2 oz (45.45 kg)  BMI 18.32 kg/m2  SpO2 98%  General: aaox3 Cardiovascular: RRR Respiratory: CTABL AB: RLQ  tenderness resolved, soft, positive bs.  Discharge Instructions You were cared for by a hospitalist during your hospital stay. If you have any questions about your discharge medications or the care you received while you were in the hospital after you are discharged, you can call the unit and asked to speak with the hospitalist on call if the hospitalist that took care of you is not available. Once you are discharged, your primary care physician will handle any further medical issues. Please note that NO REFILLS for any discharge medications will be authorized once you are discharged, as it is imperative that you return to your primary care physician (or establish a relationship with a primary care physician if you do not have one) for your aftercare needs so that they can reassess your need for medications and monitor your lab values.  Discharge Instructions    Diet - low sodium heart healthy    Complete by:  As directed      Increase activity slowly    Complete by:  As directed             Medication List    TAKE these medications        aspirin EC 81 MG tablet  Take 81 mg by mouth daily.     CALCIUM 1000 + D PO  Take 1,000 mg by mouth 2 (two) times daily.     Cranberry 500 MG Caps  Take 500 mg by mouth daily.     cyanocobalamin 1000 MCG/ML injection  Commonly known as:  (VITAMIN B-12)  Inject 1,000 mcg into the muscle every 30 (thirty) days. Last injection August 03, 2015     mesalamine 250 MG CR capsule  Commonly known as:  PENTASA  Take 4 capsules (1,000 mg total) by mouth 4 (four) times daily.     ondansetron 4 MG tablet  Commonly known as:  ZOFRAN  Take 1 tablet (4 mg total) by mouth every 6 (six) hours as needed for nausea.     phenytoin 100 MG ER capsule  Commonly known as:  DILANTIN  Take 200 mg by mouth 2 (two) times daily.     phenytoin 30 MG ER capsule  Commonly known as:  DILANTIN  Take 1 capsule at bedtime. Take together with Phenytoin 268m at bedtime for  total of 2338mat bedtime     potassium chloride SA 20 MEQ tablet  Commonly known as:  K-DUR,KLOR-CON  Take 40 mEq by mouth 2 (two) times daily.     predniSONE 20 MG tablet  Commonly known as:  DELTASONE  Take 2 tablets (40 mg total) by mouth daily with breakfast.     TYLENOL PO  Take 1 tablet by mouth at bedtime as needed (sleep).     Vitamin D 1000 UNITS capsule  Take 1,000 Units by mouth 2 (two) times daily.       Allergies  Allergen Reactions  . Ampicillin Diarrhea  . Penicillins Diarrhea    Has patient had a PCN reaction causing immediate rash, facial/tongue/throat swelling, SOB or lightheadedness with hypotension: No Has patient had a PCN reaction causing severe rash involving mucus membranes or skin necrosis: No Has patient had a PCN reaction that required hospitalization No Has patient had a  PCN reaction occurring within the last 10 years: No If all of the above answers are "NO", then may proceed with Cephalosporin use.  . Pneumococcal Vac Polyvalent Other (See Comments)    Local swelling  . Tetanus Toxoid Swelling    Arm swelling       Follow-up Information    Follow up with Nicole Juba, NP In 2 weeks.   Why:  hospital discharge follow up, repeat cbc/bmp at follow up, consider hematology referral if remain neutropenic   Contact information:   [place], [place] [address] [phone]       Follow up with Hvozdovic, Vita Barley, PA-C On 09/17/2015.   Specialty:  Physician Assistant   Why:  appt 1:15, please be at office for 1:00.   Contact information:   [place] [id number] [phone]        The results of significant diagnostics from this hospitalization (including imaging, microbiology, ancillary and laboratory) are listed below for reference.    Significant Diagnostic Studies: Ct Abdomen Pelvis W Contrast  08/31/2015   CLINICAL DATA:  HPI: 69 year old with past medical history of Crohn's  disease, remote ileocecectomy, was recently on 6-MP which was discontinued because of pancytopenia came [facility] ED at [facility] complaining about 2 days history of crampy abdominal pain, no bowel movements, nausea and vomiting 1. Abdominal x-rays showed proximal small bowel obstruction. Past medical Hx; Seizure Disorder; Colonic Polyps; Crohns Disease  EXAM: CT ABDOMEN AND PELVIS WITH CONTRAST  TECHNIQUE: Multidetector CT imaging of the abdomen and pelvis was performed using the standard protocol following bolus administration of intravenous contrast.  CONTRAST:  60m OMNIPAQUE IOHEXOL 300 MG/ML  SOLN  COMPARISON:  Current abdominal radiographs.  CT, 06/22/2013.  FINDINGS: Gastrointestinal: There is contrast throughout a mildly distended colon. There is an area focal narrowing of the ascending colon with associated wall thickening and adjacent mesenteric inflammation and a small amount of adjacent fluid. Wall measures 6 mm in thickness in this location. No other colonic wall thickening. A portion of the inferior ascending colon extends to the left inguinal region lateral to the left external iliac vessels. This does not cause obstruction. The distal small bowel hat and for approximately 10-15 cm proximal to the ileocolic anastomosis is thick-walled with a maximal wall thickness of 7 mm. There is adjacent mesenteric inflammation and small amount of fluid. There is another area of small bowel wall thickening in the left central abdomen. Bowel proximal to this is mildly dilated with a maximum dilation of 3.2 cm.  Liver: Multiple intrahepatic dilated ducts could there is significant dilation of the common bile duct measuring 13 mm. There is relatively abrupt distal tapering in the pancreatic head. Mild prominence is noted of the pancreatic duct. These findings are stable from the prior CT. No liver mass or focal lesion.  Spleen: Borderline enlarged measuring 12 cm in greatest dimension. No splenic mass or focal  lesion.  Adrenal glands: No masses.  Kidneys, ureters, bladder: Mild bilateral renal cortical thinning. Multiple bilateral low-density renal masses consistent with cysts. No hydronephrosis. Ureters normal course and caliber. Bladder is unremarkable.  Uterus and adnexa:  Uterus surgically absent.  No pelvic masses.  Lymph nodes:  No pathologically enlarged lymph nodes.  There is no free intraperitoneal air.  Ascites: Trace ascites is seen adjacent to the liver as well as adjacent to the inflamed right colon and distal small bowel.  Musculoskeletal:  Diffusely demineralized bony structures. No osteoblastic or osteolytic lesions.  IMPRESSION: 1. Findings consistent with active Crohn's disease. There is a section of colon inflammation involving the ascending colon. There is inflammation of the distal small bowel for approximately 10-15 cm in length. Another sections small bowel appears thick walled in the central to left central abdomen. No abscess. No free air. 2. There is mild dilation of proximal small bowel, which appears to be due to adynamic ileus. There is no convincing obstruction. 3. Trace amount of ascites. 4. Chronic intern extrahepatic bile duct dilation. 5. Borderline splenomegaly. 6. Multiple small renal cysts.  Status post hysterectomy.   Electronically Signed   By: Lajean Manes M.D.   On: 08/31/2015 21:12    Microbiology: No results found for this or any previous visit (from the past 240 hour(s)).   Labs: Basic Metabolic Panel:  Recent Labs Lab 08/31/15 1833 09/01/15 0507 09/02/15 0545 09/03/15 0400 09/04/15 0427  NA  --  133* 136 139 138  K  --  3.8 4.2 3.3* 2.6*  CL  --  102 107 113* 112*  CO2  --  23 15* 20* 20*  GLUCOSE  --  95 76 89 99  BUN  --  10 13 8  <5*  CREATININE 1.02* 1.08* 1.20* 0.91 0.82  CALCIUM  --  7.5* 7.2* 7.2* 7.2*  MG  --   --   --   --  1.5*   Liver Function Tests:  Recent Labs Lab 09/01/15 0507 09/04/15 0427  AST 19 20  ALT 22 16  ALKPHOS 114 83    BILITOT 0.9 0.3  PROT 5.1* 4.8*  ALBUMIN 2.6* 2.5*   No results for input(s): LIPASE, AMYLASE in the last 168 hours. No results for input(s): AMMONIA in the last 168 hours. CBC:  Recent Labs Lab 08/31/15 1833 09/02/15 0545 09/03/15 0400 09/04/15 0427  WBC 6.4 5.1 2.7* 1.8*  NEUTROABS  --   --   --  1.1*  HGB 11.6* 11.0* 9.8* 10.1*  HCT 34.6* 32.6* 29.4* 29.8*  MCV 93.0 93.4 92.5 92.0  PLT 121* 100* 120* PENDING   Cardiac Enzymes: No results for input(s): CKTOTAL, CKMB, CKMBINDEX, TROPONINI in the last 168 hours. BNP: BNP (last 3 results) No results for input(s): BNP in the last 8760 hours.  ProBNP (last 3 results) No results for input(s): PROBNP in the last 8760 hours.  CBG:  Recent Labs Lab 08/31/15 1652  GLUCAP 86       Signed:  Xu,Fang MD, PhD  [facility] 09/04/2015, 9:59 AM

## 2015-09-05 ENCOUNTER — Telehealth: Payer: Self-pay | Admitting: Oncology

## 2015-09-05 NOTE — Telephone Encounter (Signed)
Called and left a message with October appointment

## 2015-09-17 ENCOUNTER — Other Ambulatory Visit (INDEPENDENT_AMBULATORY_CARE_PROVIDER_SITE_OTHER): Payer: Medicare Other

## 2015-09-17 ENCOUNTER — Ambulatory Visit (INDEPENDENT_AMBULATORY_CARE_PROVIDER_SITE_OTHER): Payer: Medicare Other | Admitting: Physician Assistant

## 2015-09-17 ENCOUNTER — Encounter: Payer: Self-pay | Admitting: Physician Assistant

## 2015-09-17 VITALS — BP 112/64 | HR 72 | Ht 62.0 in | Wt 102.0 lb

## 2015-09-17 DIAGNOSIS — K5010 Crohn's disease of large intestine without complications: Secondary | ICD-10-CM

## 2015-09-17 LAB — CBC WITH DIFFERENTIAL/PLATELET
Basophils Absolute: 0 10*3/uL (ref 0.0–0.1)
Basophils Relative: 0.2 % (ref 0.0–3.0)
EOS PCT: 0.3 % (ref 0.0–5.0)
Eosinophils Absolute: 0 10*3/uL (ref 0.0–0.7)
HCT: 36.5 % (ref 36.0–46.0)
Hemoglobin: 12 g/dL (ref 12.0–15.0)
LYMPHS ABS: 0.7 10*3/uL (ref 0.7–4.0)
Lymphocytes Relative: 12.4 % (ref 12.0–46.0)
MCHC: 32.9 g/dL (ref 30.0–36.0)
MCV: 94.5 fl (ref 78.0–100.0)
MONO ABS: 0.1 10*3/uL (ref 0.1–1.0)
Monocytes Relative: 1.7 % — ABNORMAL LOW (ref 3.0–12.0)
NEUTROS PCT: 85.4 % — AB (ref 43.0–77.0)
Neutro Abs: 5.1 10*3/uL (ref 1.4–7.7)
Platelets: 164 10*3/uL (ref 150.0–400.0)
RBC: 3.86 Mil/uL — AB (ref 3.87–5.11)
RDW: 15.9 % — ABNORMAL HIGH (ref 11.5–15.5)
WBC: 6 10*3/uL (ref 4.0–10.5)

## 2015-09-17 LAB — C-REACTIVE PROTEIN: CRP: 0.1 mg/dL — AB (ref 0.5–20.0)

## 2015-09-17 MED ORDER — PREDNISONE 20 MG PO TABS: ORAL_TABLET | ORAL | Status: DC

## 2015-09-17 MED ORDER — MESALAMINE ER 250 MG PO CPCR
1000.0000 mg | ORAL_CAPSULE | Freq: Four times a day (QID) | ORAL | Status: DC
Start: 2015-09-17 — End: 2016-01-19

## 2015-09-17 NOTE — Patient Instructions (Addendum)
Please go to the basement level to have your labs drawn.  You have been scheduled for a colonoscopy. Please follow written instructions given to you at your visit today.  Please pick up your prep supplies at the pharmacy within the next 1-3 days. If you use inhalers (even only as needed), please bring them with you on the day of your procedure. Your physician has requested that you go to www.[email or web address].[email or web address] and enter the access code given to you at your visit today. This web site gives a general overview about your procedure. However, you should still follow specific instructions given to you by our office regarding your preparation for the procedure.   Prednisone 10 mg Take 3 1/2 tabs x 7 days Take 3 tabs x 7 days Take 2 1/2 tabs x 7 days Take 2 tabs x 7 days Take 1 1/2 tabs x 7 days Take 1 tab x 7 days Take 1/2 tab x 7 days then stop.

## 2015-09-17 NOTE — Progress Notes (Addendum)
Patient ID: [name], female   DOB: February 11, 1946, 68 y.o.   MRN: [id number]     History of Present Illness: [name] is a delightful 69 year old female with a history of Crohn's disease status post ileocecal rectum A who presented to the hospital September 19 with abdominal pain, abnormal GI imaging consistent with active Crohn's disease. There was evidence of abscess though there did not appear to be an adynamic ileus without convincing evidence for obstruction. She was started on IV Solu-Medrol, analgesia, IV hydration, and bowel rest. Her last colonoscopy was in August 2014 at which time she also had an upper endoscopy. The colonoscopy was normal with an unremarkable anastomosis. The anastomosis was narrow and the scope did not pass proximally. Biopsy was were taken and revealed benign small bowel. Upper endoscopy was normal with no evidence of Crohn's. She had intermittent symptoms which were felt to be due to benign stricturing. In July 2015 she had an episode of right lower quadrant pain associated with constipation and vomiting which resolved in one or 2 days. He was doing well until this admission when she developed severe right lower quadrant pain. She was initially seen at [facility] and had an x-ray that revealed a small bowel obstruction. She was then advised to come to [facility] so that she could be evaluated by a gastroenterologist. CT of the abdomen and pelvis showed a section of colon inflammation involving the ascending colon. There was inflammation of the distal small bowel for approximately 10-15 cm in length. Another section of small bowel appeared thick walled in the central to left central abdomen. No abscess. No free air. There is mild dilation of the proximal small bowel, which appears to be due to adynamic ileus with no convincing obstruction. She was also noted to have chronic intra-and extrahepatic bile duct dilatation which appeared stable  from prior scans. She was treated with IV steroids and started on Pentecost soft. She was discharged home on September 22 on prednisone 40 mg daily and Pentasa 250 mg 4 capsules 4 times daily. She returns today for follow-up. She has been not experiencing any nausea or vomiting and has much less abdominal pain. Her stools remain loose to mushy and occur 5-8 times per day with nocturnal stooling. She has not had any bright red blood per rectum or melena. She ran out of Pentasa.   Past Medical History  Diagnosis Date  . Osteoporosis, unspecified   . Seizure disorder ([place])   . Malignant neoplasm of breast (female), unspecified site     right  . Malignant neoplasm of corpus uteri, except isthmus ([place])   . Personal history of colonic polyps     hyperplastic  . Crohn's disease [facility])     Past Surgical History  Procedure Laterality Date  . Colon resection    . Abdominal hysterectomy    . Cholecystectomy    . Breast lumpectomy    . Elbow surgery      left   Family History  Problem Relation Age of Onset  . Breast cancer Maternal Aunt   . Heart disease Mother   . Aneurysm Father   . Colon cancer Neg Hx    Social History  Substance Use Topics  . Smoking status: Never Smoker   . Smokeless tobacco: Never Used  . Alcohol Use: No   Current Outpatient Prescriptions  Medication Sig Dispense Refill  . Acetaminophen (TYLENOL PO) Take 1 tablet by mouth at bedtime as needed (sleep).    Marland Kitchen  aspirin EC 81 MG tablet Take 81 mg by mouth daily.    . Calcium Carb-Cholecalciferol (CALCIUM 1000 + D PO) Take 1,000 mg by mouth 2 (two) times daily.    . Cholecalciferol (VITAMIN D) 1000 UNITS capsule Take 1,000 Units by mouth 2 (two) times daily.      . Cranberry 500 MG CAPS Take 500 mg by mouth daily.    . cyanocobalamin (,VITAMIN B-12,) 1000 MCG/ML injection Inject 1,000 mcg into the muscle every 30 (thirty) days. Last injection August 03, 2015    . phenytoin (DILANTIN) 100 MG ER capsule Take 200 mg by  mouth 2 (two) times daily.     . phenytoin (DILANTIN) 30 MG ER capsule Take 1 capsule at bedtime. Take together with Phenytoin 246m at bedtime for total of 2390mat bedtime (Patient taking differently: Take 30 mg by mouth See admin instructions. Take 1 capsule (30 mg) daily at bedtime with 2 100 mg capsules for a 230 mg bedtime dose) 30 capsule 11  . potassium chloride SA (K-DUR,KLOR-CON) 20 MEQ tablet Take 40 mEq by mouth 2 (two) times daily.     . predniSONE (DELTASONE) 20 MG tablet 3 1/2 tab by mouth daily x 7 days, 3 tabs by mouth daily x 7 days, 2 1/2 tabs by mouth x 7 days,2 tabs by mouth x 7 days, 1 1/2 tabs by moluth x 7 days, 1 tab by mouth x 7 days, 1/2 tab by mouth x 7 days then stop. 100 tablet 0  . mesalamine (PENTASA) 250 MG CR capsule Take 4 capsules (1,000 mg total) by mouth 4 (four) times daily. 480 capsule 3  . ondansetron (ZOFRAN) 4 MG tablet Take 1 tablet (4 mg total) by mouth every 6 (six) hours as needed for nausea. (Patient not taking: Reported on 09/17/2015) 20 tablet 0   No current facility-administered medications for this visit.   Allergies  Allergen Reactions  . Ampicillin Diarrhea  . Penicillins Diarrhea    Has patient had a PCN reaction causing immediate rash, facial/tongue/throat swelling, SOB or lightheadedness with hypotension: No Has patient had a PCN reaction causing severe rash involving mucus membranes or skin necrosis: No Has patient had a PCN reaction that required hospitalization No Has patient had a PCN reaction occurring within the last 10 years: No If all of the above answers are "NO", then may proceed with Cephalosporin use.  . Pneumococcal Vac Polyvalent Other (See Comments)    Local swelling  . Tetanus Toxoid Swelling    Arm swelling     Review of Systems: Gen: Denies any fever, chills, sweats, anorexia, fatigue, weakness, malaise, weight loss, and sleep disorder CV: Denies chest pain, angina, palpitations, syncope, orthopnea, PND, peripheral  edema, and claudication. Resp: Denies dyspnea at rest, dyspnea with exercise, cough, sputum, wheezing, coughing up blood, and pleurisy. GI: Denies vomiting blood, jaundice, and fecal incontinence.   Denies dysphagia or odynophagia. GU : Denies urinary burning, blood in urine, urinary frequency, urinary hesitancy, nocturnal urination, and urinary incontinence. MS: Denies joint pain, limitation of movement, and swelling, stiffness, low back pain, extremity pain. Denies muscle weakness, cramps, atrophy.  Derm: Denies rash, itching, dry skin, hives, moles, warts, or unhealing ulcers.  Psych: Denies depression, anxiety, memory loss, suicidal ideation, hallucinations, paranoia, and confusion. Heme: Denies bruising, bleeding, and enlarged lymph nodes. Neuro:  Denies any headaches, dizziness, paresthesia Endo:  Denies any problems with DM, thyroid, adrenal  LAB RESULTS:  Recent Labs  09/17/15 1428  WBC 6.0  HGB  12.0  HCT 36.5  PLT 164.0   C reactive protein on 09/17/2015 0.1. C reactive protein on 09/02/2015  36.98 Blood work on 09/01/2015 hepatitis B surface antibody less than 3.1, inconsistent with immunity Hepatitis a total antibody negative Hepatitis C antibody negative Quantiferon TB gold negative on 09/01/2015  Studies:   Ct Abdomen Pelvis W Contrast  08/31/2015   CLINICAL DATA:  HPI: 69 year old with past medical history of Crohn's disease, remote ileocecectomy, was recently on 6-MP which was discontinued because of pancytopenia came [facility] ED at [facility] complaining about 2 days history of crampy abdominal pain, no bowel movements, nausea and vomiting 1. Abdominal x-rays showed proximal small bowel obstruction. Past medical Hx; Seizure Disorder; Colonic Polyps; Crohns Disease  EXAM: CT ABDOMEN AND PELVIS WITH CONTRAST  TECHNIQUE: Multidetector CT imaging of the abdomen and pelvis was performed using the standard protocol following bolus administration of intravenous contrast.   CONTRAST:  28m OMNIPAQUE IOHEXOL 300 MG/ML  SOLN  COMPARISON:  Current abdominal radiographs.  CT, 06/22/2013.  FINDINGS: Gastrointestinal: There is contrast throughout a mildly distended colon. There is an area focal narrowing of the ascending colon with associated wall thickening and adjacent mesenteric inflammation and a small amount of adjacent fluid. Wall measures 6 mm in thickness in this location. No other colonic wall thickening. A portion of the inferior ascending colon extends to the left inguinal region lateral to the left external iliac vessels. This does not cause obstruction. The distal small bowel hat and for approximately 10-15 cm proximal to the ileocolic anastomosis is thick-walled with a maximal wall thickness of 7 mm. There is adjacent mesenteric inflammation and small amount of fluid. There is another area of small bowel wall thickening in the left central abdomen. Bowel proximal to this is mildly dilated with a maximum dilation of 3.2 cm.  Liver: Multiple intrahepatic dilated ducts could there is significant dilation of the common bile duct measuring 13 mm. There is relatively abrupt distal tapering in the pancreatic head. Mild prominence is noted of the pancreatic duct. These findings are stable from the prior CT. No liver mass or focal lesion.  Spleen: Borderline enlarged measuring 12 cm in greatest dimension. No splenic mass or focal lesion.  Adrenal glands: No masses.  Kidneys, ureters, bladder: Mild bilateral renal cortical thinning. Multiple bilateral low-density renal masses consistent with cysts. No hydronephrosis. Ureters normal course and caliber. Bladder is unremarkable.  Uterus and adnexa:  Uterus surgically absent.  No pelvic masses.  Lymph nodes:  No pathologically enlarged lymph nodes.  There is no free intraperitoneal air.  Ascites: Trace ascites is seen adjacent to the liver as well as adjacent to the inflamed right colon and distal small bowel.  Musculoskeletal: Diffusely  demineralized bony structures. No osteoblastic or osteolytic lesions.  IMPRESSION: 1. Findings consistent with active Crohn's disease. There is a section of colon inflammation involving the ascending colon. There is inflammation of the distal small bowel for approximately 10-15 cm in length. Another sections small bowel appears thick walled in the central to left central abdomen. No abscess. No free air. 2. There is mild dilation of proximal small bowel, which appears to be due to adynamic ileus. There is no convincing obstruction. 3. Trace amount of ascites. 4. Chronic intern extrahepatic bile duct dilation. 5. Borderline splenomegaly. 6. Multiple small renal cysts.  Status post hysterectomy.   Electronically Signed   By: DLajean ManesM.D.   On: 08/31/2015 21:12     Physical Exam:  BP 112/64 mmHg  Pulse 72  Ht 5' 2"  (1.575 m)  Wt 102 lb 0.2 oz (46.273 kg)  BMI 18.65 kg/m2 General: Pleasant, well developed , female in no acute distress Head: Normocephalic and atraumatic Eyes:  sclerae anicteric, conjunctiva pink  Ears: Normal auditory acuity Lungs: Clear throughout to auscultation Heart: Regular rate and rhythm Abdomen: Soft, non distended, mild right lower quadrant abdominal tenderness to palpation with no rebound or guarding, No masses, no hepatomegaly. Normal bowel sounds Musculoskeletal: Symmetrical with no gross deformities  Extremities: No edema  Neurological: Alert oriented x 4, grossly nonfocal Psychological:  Alert and cooperative. Normal mood and affect  Assessment and Recommendations: 69 year old female with a history of Crohn's status post ileocecostomy admitted with abdominal pain and abnormal GI imaging consistent with active Crohn's disease. Patient is to be scheduled for a colonoscopy to assess for disease activity. The risks, benefits, and alternatives to colonoscopy with possible biopsy and possible polypectomy were discussed with the patient and they consent to proceed.  The  procedure will be scheduled with Dr. Hilarie Fredrickson. In the meantime, patient will continue prednisone 40 mg for one more day and then will start 35 mg daily for one week and decrease by 5 mg weekly to complete. She has been given a prescription for a refill for Pentasa 250 mg, 4 capsules 4 times daily. CBC and CRP will be obtained today. Further recommendations will be made pending the findings of the above.    Saige Canton, Vita Barley PA-C 09/17/2015,   Addendum: Reviewed and agree with management. Jerene Bears, MD

## 2015-09-18 ENCOUNTER — Telehealth: Payer: Self-pay | Admitting: Physician Assistant

## 2015-09-18 ENCOUNTER — Other Ambulatory Visit: Payer: Self-pay

## 2015-09-18 MED ORDER — PREDNISONE 5 MG PO TABS: ORAL_TABLET | ORAL | Status: DC

## 2015-09-21 ENCOUNTER — Other Ambulatory Visit: Payer: Self-pay | Admitting: Oncology

## 2015-09-22 ENCOUNTER — Ambulatory Visit (HOSPITAL_BASED_OUTPATIENT_CLINIC_OR_DEPARTMENT_OTHER): Payer: Medicare Other | Admitting: Oncology

## 2015-09-22 ENCOUNTER — Other Ambulatory Visit (HOSPITAL_BASED_OUTPATIENT_CLINIC_OR_DEPARTMENT_OTHER): Payer: Medicare Other

## 2015-09-22 ENCOUNTER — Encounter: Payer: Self-pay | Admitting: Oncology

## 2015-09-22 VITALS — BP 127/75 | HR 89 | Temp 98.1°F | Resp 18 | Ht 62.0 in | Wt 104.5 lb

## 2015-09-22 DIAGNOSIS — D696 Thrombocytopenia, unspecified: Secondary | ICD-10-CM

## 2015-09-22 DIAGNOSIS — D649 Anemia, unspecified: Secondary | ICD-10-CM

## 2015-09-22 DIAGNOSIS — Z8542 Personal history of malignant neoplasm of other parts of uterus: Secondary | ICD-10-CM

## 2015-09-22 DIAGNOSIS — G40909 Epilepsy, unspecified, not intractable, without status epilepticus: Secondary | ICD-10-CM

## 2015-09-22 DIAGNOSIS — Z79899 Other long term (current) drug therapy: Secondary | ICD-10-CM | POA: Diagnosis not present

## 2015-09-22 DIAGNOSIS — Z853 Personal history of malignant neoplasm of breast: Secondary | ICD-10-CM

## 2015-09-22 LAB — CBC WITH DIFFERENTIAL/PLATELET
BASO%: 0.2 % (ref 0.0–2.0)
Basophils Absolute: 0 10*3/uL (ref 0.0–0.1)
EOS%: 0.1 % (ref 0.0–7.0)
Eosinophils Absolute: 0 10*3/uL (ref 0.0–0.5)
HEMATOCRIT: 34.9 % (ref 34.8–46.6)
HGB: 11.3 g/dL — ABNORMAL LOW (ref 11.6–15.9)
LYMPH#: 0.6 10*3/uL — AB (ref 0.9–3.3)
LYMPH%: 12 % — ABNORMAL LOW (ref 14.0–49.7)
MCH: 30.7 pg (ref 25.1–34.0)
MCHC: 32.3 g/dL (ref 31.5–36.0)
MCV: 95 fL (ref 79.5–101.0)
MONO#: 0.1 10*3/uL (ref 0.1–0.9)
MONO%: 1.5 % (ref 0.0–14.0)
NEUT#: 4.3 10*3/uL (ref 1.5–6.5)
NEUT%: 86.2 % — AB (ref 38.4–76.8)
Platelets: 125 10*3/uL — ABNORMAL LOW (ref 145–400)
RBC: 3.67 10*6/uL — AB (ref 3.70–5.45)
RDW: 15.8 % — ABNORMAL HIGH (ref 11.2–14.5)
WBC: 5 10*3/uL (ref 3.9–10.3)

## 2015-09-22 LAB — MORPHOLOGY: PLT EST: DECREASED

## 2015-09-22 NOTE — Progress Notes (Signed)
OFFICE PROGRESS NOTE   September 22, 2015   Physicians: Estrella Deeds, NP with [facility]; Zenovia Jarred, Ellouise Newer, Cindie Laroche)  Outside records from [facility] reviewed and will be scanned into this EMR, also summarized below  INTERVAL HISTORY:  Patient is seen, together with husband Herbie [place], for first time back by this MD since 05-2013, this visit requested by PCP for low platelets.  I have previously known patient for T1N0 right breast cancer in 1990, on observation since completing adjuvant CMF chemotherapy and 5 years of tamoxifen. Last bilateral mammograms were at [facility] ~ Dec 2015.  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 She has seizure disorder on dilantin x ~ 15 years. She is now followed by Dr Delice Lesch, next visit May 2017. Patient has been reluctant to change dilantin to another antiseizure medication, tho is aware that there can be long term side effects of dilantin. She has had no recent seizures.  She has had flare of Crohn's, on ~ 6 week taper of steroids after hospitalization for this Sept 2016. She had been off steroids for 3-4 years prior to this flare. She has also started mesalamine and is for colonoscopy by Dr Hilarie Fredrickson next month.  Patient has had platelet counts around low normal range intermittently back at least to 2011, which is furthest in this EMR. She bruises easily, but is also on daily ASA; she has had no significant bleeding. CBC from [facility] ~ 08-19-15 had WBC 3.2, Hgb 11.4, MCV 88, plt 116, and previous platelet counts from [place] for 4 months prior were132k, 1797k, 154k. CBC ~ Aug 2015 at [facility] had WBC 3.7, Hgb 11.7, plt 155k. From this EMR, in 2011 platelets ranged from 132k to 170k; in 2012 were 125k, 132k and 129k; in 05-2012 135k then 150s thru 12-2014. CBC with diff on 09-17-15 by [facility] had WBC 6.0 (on steroids), Hgb 12, plt 164.  CT AP 08-31-15 in [place] (with admission for  Crohn's flare) had no adenopathy and spleen borderline enlarged at 12 cm without mass or focal lesion. No findings on scan suggesting hepatic cirrhosis, no pelvic masses, trace ascites adjacent to inflamed colon.  No central catheter Flu vaccine done 09-01-15 No genetics testing   4 grandchildren: oldest boy just started high school, granddaughter Violet in middle school, granddaughter age 71, grandson age 34  ONCOLOGIC HISTORY RIGHT BREAST CANCER:  T1N0 right breast cancerin 69 at age 34 and premenopausal, treated with CMF chemotherapy, local radiation and 5 years of tamoxifen. She has mammograms at [facility] yearly, due ~ Dec 2016.  ENDOMETRIAL CARCINOMA: IB grade 1 endometrial carcinoma, with surgery by Dr Clarene Essex Dec 2007 and no adjuvant therapy. She was followed regularly by gyn oncology until 5 years out from diagnosis, now on prn follow up with gyn onc.    Review of systems as above, also: Symptoms from Crohn's gradually increased over several months prior to acute abdominal pain Sept 2016. She denies abdominal or pelvic pain now, is tolerating liquids and soft foods including creamed potatoes. She has gained some weight since DC from hospital. No gross blood with bowel movements. No fever now. No SOB or cough. No noted changes in breasts bilaterally. No new or different musculoskeletal pain. Not sleeping well with steroids. Takes steroids with food, no GERD symptoms.  Remainder of 10 point Review of Systems negative.  Objective:  Vital signs in last 24 hours:  BP 127/75 mmHg  Pulse 89  Temp(Src) 98.1 F (36.7 C) (Oral)  Resp 18  Ht 5' 2"  (1.575 m)  Wt 104 lb 8 oz (47.401 kg)  BMI 19.11 kg/m2 This weight is down from 108 in 05-2013 and from 114 in 05-2012 Alert, oriented and appropriate. Ambulatory without difficulty. Looks comfortable, very pleasant, husband very supportive   HEENT:PERRL, sclerae not icteric. Poor dentition, not new. Oral mucosa moist without lesions,  posterior pharynx clear.  Neck supple. No JVD.  Lymphatics:no cervical,supraclavicular, axillary or inguinal adenopathy Resp: clear to auscultation bilaterally and normal percussion bilaterally Cardio: regular rate and rhythm. No gallop. GI: soft, nontender including epigastrium, not distended, no mass or organomegaly including no palpable splenomegaly. Normally active bowel sounds. Surgical incision not remarkable. Musculoskeletal/ Extremities: UE and LE without pitting edema, cords, tenderness. Decreased muscle mass thruout Neuro: speech fluent and appropriate. CN, motor, sensory, cerebellar without focal deficits. PSYCH appropriate mood and affect Skin without rash, petechiae. Scattered resolving ecchymoses up to ~ 2 cm bilateral forearms Breasts: Right lumpectomy scar withtout evidence of local recurence, radiation tattoos still visible, otherwise bilaterally somewhat atrophic without dominant mass, skin or nipple findings. Axillae benign.   Lab Results:  Results for orders placed or performed in visit on 09/22/15  CBC with Differential  Result Value Ref Range   WBC 5.0 3.9 - 10.3 10e3/uL   NEUT# 4.3 1.5 - 6.5 10e3/uL   HGB 11.3 (L) 11.6 - 15.9 g/dL   HCT 34.9 34.8 - 46.6 %   Platelets 125 (L) 145 - 400 10e3/uL   MCV 95.0 79.5 - 101.0 fL   MCH 30.7 25.1 - 34.0 pg   MCHC 32.3 31.5 - 36.0 g/dL   RBC 3.67 (L) 3.70 - 5.45 10e6/uL   RDW 15.8 (H) 11.2 - 14.5 %   lymph# 0.6 (L) 0.9 - 3.3 10e3/uL   MONO# 0.1 0.1 - 0.9 10e3/uL   Eosinophils Absolute 0.0 0.0 - 0.5 10e3/uL   Basophils Absolute 0.0 0.0 - 0.1 10e3/uL   NEUT% 86.2 (H) 38.4 - 76.8 %   LYMPH% 12.0 (L) 14.0 - 49.7 %   MONO% 1.5 0.0 - 14.0 %   EOS% 0.1 0.0 - 7.0 %   BASO% 0.2 0.0 - 2.0 %  Morphology  Result Value Ref Range   Tear Drop Cells Few Negative   Ovalocytes Few Negative   White Cell Comments C/W auto diff    PLT EST Decreased Adequate    No giant platelets noted  Studies/Results: EXAM: CT ABDOMEN AND PELVIS  WITH CONTRAST  08-31-15  COMPARISON: Current abdominal radiographs. CT, 06/22/2013.  FINDINGS: Gastrointestinal: There is contrast throughout a mildly distended colon. There is an area focal narrowing of the ascending colon with associated wall thickening and adjacent mesenteric inflammation and a small amount of adjacent fluid. Wall measures 6 mm in thickness in this location. No other colonic wall thickening. A portion of the inferior ascending colon extends to the left inguinal region lateral to the left external iliac vessels. This does not cause obstruction. The distal small bowel hat and for approximately 10-15 cm proximal to the ileocolic anastomosis is thick-walled with a maximal wall thickness of 7 mm. There is adjacent mesenteric inflammation and small amount of fluid. There is another area of small bowel wall thickening in the left central abdomen. Bowel proximal to this is mildly dilated with a maximum dilation of 3.2 cm.  Liver: Multiple intrahepatic dilated ducts could there is significant dilation of the common bile duct measuring 13 mm.  There is relatively abrupt distal tapering in the pancreatic head. Mild prominence is noted of the pancreatic duct. These findings are stable from the prior CT. No liver mass or focal lesion.  Spleen: Borderline enlarged measuring 12 cm in greatest dimension. No splenic mass or focal lesion.  Adrenal glands: No masses.  Kidneys, ureters, bladder: Mild bilateral renal cortical thinning. Multiple bilateral low-density renal masses consistent with cysts. No hydronephrosis. Ureters normal course and caliber. Bladder is unremarkable.  Uterus and adnexa: Uterus surgically absent. No pelvic masses.  Lymph nodes: No pathologically enlarged lymph nodes.  There is no free intraperitoneal air.  Ascites: Trace ascites is seen adjacent to the liver as well as adjacent to the inflamed right colon and distal small  bowel.  Musculoskeletal: Diffusely demineralized bony structures. No osteoblastic or osteolytic lesions.  IMPRESSION: 1. Findings consistent with active Crohn's disease. There is a section of colon inflammation involving the ascending colon. There is inflammation of the distal small bowel for approximately 10-15 cm in length. Another sections small bowel appears thick walled in the central to left central abdomen. No abscess. No free air. 2. There is mild dilation of proximal small bowel, which appears to be due to adynamic ileus. There is no convincing obstruction. 3. Trace amount of ascites. 4. Chronic intern extrahepatic bile duct dilation. 5. Borderline splenomegaly. 6. Multiple small renal cysts. Status post hysterectomy.    PACs images reviewed by MD  Medications: I have reviewed the patient's current medications.  DISCUSSION:  All of interval history above reviewed with patient and husband. Variable mild thrombocytopenia present intermittently back at least to 2011 (and per my notes even back to 2010)  asymptomatic. Likely multifactorial, including possibly the dilantin (doubt mesalamine, reported only <1% with this) and minimally enlarged spleen. This does not appear to be ITP from morphology and as no persistent improvement despite ongoing steroids now. Does not look concerning for lymphoma related to dilantin given absent systemic symptoms and no adenopathy on exam or recent CT AP. Not obviously of concern for significant bone marrow problem given other counts ok.  We have discussed considerations for change in dilantin, which she may be more open to considering with Dr Delice Lesch at least at next scheduled visit there. I have suggested that I see her back with repeat counts in 3-4 months. She or other MDs can be in touch sooner if any persistent bleeding or significantly increased bruising, or if other counts done in meantime. Seems fine to continue ASA now, tho would hold this  if plt < ~ 100k or more bleeding/ bruising.  She will get mammograms as scheduled at [facility] in ~ Dec.   Assessment/Plan: 1.variable, mildly low platelet counts back at least to 2011: could be related to dilantin and/ or slightly increased spleen size. No other findings of concern now, see above. Repeat CBC with diff and morphology here in 3-4 months, or sooner if concerns 2.history of T1N0 right breast cancer at age 72 in 62: treated with lumpectomy, CMF, radiation and 5 years tamoxifen. No known disease since then. Yearly mammograms 3.Stage 1 grade 1 endometrial cancer treated surgically 2007, no adjuvant therapy required. Note previous tamoxifen history, which may have been related. Clinically and by recent scans still NED. 4.Crohn's disease with recent flare, on prednisone slow taper over ~ 8 weeks + 4x daily mesalamine. Still very careful diet 5.seizure disorder on dilantin x ~ 15 years, now followed by Dr Delice Lesch 6.low bone density by previous DEXA and comment  on imaging during recent hospitalization. May have had more recent DEXA by PCP, needs yearly per Dr Delice Lesch 7.flu vaccine 09-01-15  Patient and husband have had all questions answered to their satisfaction and are in agreement with recommendations and plans above.  CC Estrella Deeds, K.Aquino, J.Pyrtle Time spent 40 min including >50% counseling and coordination of care.     Orbie Grupe P, MD   09/22/2015, 4:40 PM

## 2015-09-23 ENCOUNTER — Telehealth: Payer: Self-pay | Admitting: Oncology

## 2015-09-23 NOTE — Telephone Encounter (Signed)
lvm for pt regarding to Jan 2017 appt.....mailed pt appt sched/avs and letter

## 2015-09-24 DIAGNOSIS — Z853 Personal history of malignant neoplasm of breast: Secondary | ICD-10-CM | POA: Insufficient documentation

## 2015-09-24 DIAGNOSIS — Z79899 Other long term (current) drug therapy: Secondary | ICD-10-CM | POA: Insufficient documentation

## 2015-09-24 DIAGNOSIS — G40909 Epilepsy, unspecified, not intractable, without status epilepticus: Secondary | ICD-10-CM | POA: Insufficient documentation

## 2015-09-24 DIAGNOSIS — Z8542 Personal history of malignant neoplasm of other parts of uterus: Secondary | ICD-10-CM | POA: Insufficient documentation

## 2015-10-09 ENCOUNTER — Encounter (HOSPITAL_COMMUNITY): Payer: Self-pay | Admitting: *Deleted

## 2015-10-15 ENCOUNTER — Encounter (HOSPITAL_COMMUNITY): Payer: Self-pay | Admitting: *Deleted

## 2015-10-15 ENCOUNTER — Ambulatory Visit (HOSPITAL_COMMUNITY)
Admission: RE | Admit: 2015-10-15 | Discharge: 2015-10-15 | Disposition: A | Discharge status: Home / Self Care | Payer: Medicare Other | Source: Ambulatory Visit | Attending: Internal Medicine | Admitting: Internal Medicine

## 2015-10-15 ENCOUNTER — Ambulatory Visit (HOSPITAL_COMMUNITY): Payer: Medicare Other | Admitting: Anesthesiology

## 2015-10-15 ENCOUNTER — Encounter (HOSPITAL_COMMUNITY)
Admission: RE | Disposition: A | Discharge status: Home / Self Care | Payer: Self-pay | Source: Ambulatory Visit | Attending: Internal Medicine

## 2015-10-15 DIAGNOSIS — Z7982 Long term (current) use of aspirin: Secondary | ICD-10-CM | POA: Diagnosis not present

## 2015-10-15 DIAGNOSIS — K635 Polyp of colon: Secondary | ICD-10-CM

## 2015-10-15 DIAGNOSIS — D123 Benign neoplasm of transverse colon: Secondary | ICD-10-CM | POA: Insufficient documentation

## 2015-10-15 DIAGNOSIS — Z980 Intestinal bypass and anastomosis status: Secondary | ICD-10-CM | POA: Diagnosis not present

## 2015-10-15 DIAGNOSIS — Z79899 Other long term (current) drug therapy: Secondary | ICD-10-CM | POA: Insufficient documentation

## 2015-10-15 DIAGNOSIS — Z8542 Personal history of malignant neoplasm of other parts of uterus: Secondary | ICD-10-CM | POA: Diagnosis not present

## 2015-10-15 DIAGNOSIS — Z86010 Personal history of colonic polyps: Secondary | ICD-10-CM | POA: Diagnosis not present

## 2015-10-15 DIAGNOSIS — K529 Noninfective gastroenteritis and colitis, unspecified: Secondary | ICD-10-CM | POA: Diagnosis not present

## 2015-10-15 DIAGNOSIS — K6389 Other specified diseases of intestine: Secondary | ICD-10-CM | POA: Insufficient documentation

## 2015-10-15 DIAGNOSIS — Z7952 Long term (current) use of systemic steroids: Secondary | ICD-10-CM | POA: Insufficient documentation

## 2015-10-15 DIAGNOSIS — R933 Abnormal findings on diagnostic imaging of other parts of digestive tract: Secondary | ICD-10-CM | POA: Diagnosis present

## 2015-10-15 DIAGNOSIS — G40909 Epilepsy, unspecified, not intractable, without status epilepticus: Secondary | ICD-10-CM | POA: Diagnosis not present

## 2015-10-15 DIAGNOSIS — K5080 Crohn's disease of both small and large intestine without complications: Secondary | ICD-10-CM | POA: Diagnosis not present

## 2015-10-15 DIAGNOSIS — K5010 Crohn's disease of large intestine without complications: Secondary | ICD-10-CM

## 2015-10-15 DIAGNOSIS — Z853 Personal history of malignant neoplasm of breast: Secondary | ICD-10-CM | POA: Diagnosis not present

## 2015-10-15 HISTORY — PX: COLONOSCOPY WITH PROPOFOL: SHX5780

## 2015-10-15 SURGERY — COLONOSCOPY WITH PROPOFOL
Anesthesia: Monitor Anesthesia Care

## 2015-10-15 MED ORDER — PROPOFOL 10 MG/ML IV BOLUS
INTRAVENOUS | Status: AC
  Filled 2015-10-15: qty 20

## 2015-10-15 MED ORDER — LACTATED RINGERS IV SOLN
INTRAVENOUS | Status: DC | PRN
  Administered 2015-10-15: 13:00:00 via INTRAVENOUS

## 2015-10-15 MED ORDER — PROPOFOL 500 MG/50ML IV EMUL
INTRAVENOUS | Status: DC | PRN
  Administered 2015-10-15: 120 ug/kg/min via INTRAVENOUS

## 2015-10-15 MED ORDER — LIDOCAINE HCL (CARDIAC) 20 MG/ML IV SOLN
INTRAVENOUS | Status: DC | PRN
  Administered 2015-10-15: 50 mg via INTRAVENOUS

## 2015-10-15 MED ORDER — SODIUM CHLORIDE 0.9 % IV SOLN: INTRAVENOUS | Status: DC

## 2015-10-15 MED ORDER — PROPOFOL 10 MG/ML IV BOLUS
INTRAVENOUS | Status: DC | PRN
  Administered 2015-10-15 (×2): 20 mg via INTRAVENOUS
  Administered 2015-10-15: 40 mg via INTRAVENOUS

## 2015-10-15 SURGICAL SUPPLY — 22 items

## 2015-10-15 NOTE — Op Note (Signed)
[facility] [place], [address]   COLONOSCOPY PROCEDURE REPORT  PATIENT: [name], [name]  MR#: [id number] BIRTHDATE: 24-Sep-1946 , 69  yrs. old GENDER: female ENDOSCOPIST: Jerene Bears, MD PROCEDURE DATE:  10/15/2015 PROCEDURE:   Colonoscopy, diagnostic, Colonoscopy with biopsy, and Colonoscopy with cold biopsy polypectomy First Screening Colonoscopy - Avg.  risk and is 50 yrs.  old or older - No.  Prior Negative Screening - Now for repeat screening. N/A  History of Adenoma - Now for follow-up colonoscopy & has been > or = to 3 yrs.  N/A  Polyps removed today? Yes ASA CLASS:   Class III INDICATIONS:surveillance of previously diagnosed ileocolonic Crohn's disease status post ileocecectomy, recent hospitalization with Crohn's flare with abnormal CT showing inflammation in the neoterminal ileum and ascending colon, symptoms currently improved on prednisone taper and Pentasa. MEDICATIONS: Monitored anesthesia care and Per Anesthesia  DESCRIPTION OF PROCEDURE:   After the risks benefits and alternatives of the procedure were thoroughly explained, informed consent was obtained.  The digital rectal exam revealed firm perianal skin without visible fistula, mass or fluctuance consistent with prior perianal Crohn's involvement.   The Pentax ultraslim colonoscope was introduced through the anus and advanced to the terminal ileum which was intubated for a short distance. No adverse events experienced.   The quality of the prep was good. (Suprep was used)  The instrument was then slowly withdrawn as the colon was fully examined. Estimated blood loss is zero unless otherwise noted in this procedure report.   COLON FINDINGS: Mild erythema and luminal narrowing at the ileocolonic anastomosis.  The neoterminal ileum was able to be visualized but the scope was not able to be deeply inserted into the neoterminal ileum.  There was mild erythema in this  area. Multiple biopsies were performed.   The colonic mucosa was slightly edematous with scattered erythema but no definite evidence of active colonic Crohn's disease.  Multiple surveillance biopsies were performed throughout the colon in 4 quadrant fashion every 10 cm.   A sessile polyp measuring 3 mm in size was found in the transverse colon.  A polypectomy was performed with cold forceps. The resection was complete, the polyp tissue was completely retrieved and sent to histology.  Retroflexed views revealed no overt abnormalities. The time to cecum = 3.9 Withdrawal time = 11.8 The scope was withdrawn and the procedure completed.  COMPLICATIONS: There were no immediate complications.  ENDOSCOPIC IMPRESSION: 1.   Mild erythema and luminal narrowing at the ileocolonic anastomosis.  Multiple biopsies 2.   The colonic mucosa was slightly edematous with scattered erythema but no definite evidence of active colonic Crohn's disease.  Multiple surveillance biopsies were performed throughout the colon in 4 quadrant fashion every 10 cm 3.   Sessile polyp was found in the transverse colon; polypectomy was performed with cold forceps  RECOMMENDATIONS: 1.  Await biopsy results 2.  Continue Pentasa at current dose 3.  Continue prednisone taper to off 4.  Office follow-up next available  eSigned:  Jerene Bears, MD 10/15/2015 2:50 PM   cc:  the patient, PCP   PATIENT NAME:  [name], [name] MR#: [id number]

## 2015-10-15 NOTE — Discharge Instructions (Signed)

## 2015-10-15 NOTE — Transfer of Care (Signed)
Immediate Anesthesia Transfer of Care Note  Patient: [name]  Procedure(s) Performed: Procedure(s): COLONOSCOPY WITH PROPOFOL (N/A)  Patient Location: PACU  Anesthesia Type:MAC  Level of Consciousness: awake, alert  and oriented  Airway & Oxygen Therapy: Patient Spontanous Breathing and Patient connected to face mask oxygen  Post-op Assessment: Report given to RN and Post -op Vital signs reviewed and stable  Post vital signs: Reviewed and stable  Last Vitals: There were no vitals filed for this visit.  Complications: No apparent anesthesia complications

## 2015-10-15 NOTE — H&P (View-Only) (Signed)
Patient ID: [name], female   DOB: 06/17/1946, 69 y.o.   MRN: [id number]     History of Present Illness: [name] is a delightful 69 year old female with a history of Crohn's disease status post ileocecal rectum A who presented to the hospital September 19 with abdominal pain, abnormal GI imaging consistent with active Crohn's disease. There was evidence of abscess though there did not appear to be an adynamic ileus without convincing evidence for obstruction. She was started on IV Solu-Medrol, analgesia, IV hydration, and bowel rest. Her last colonoscopy was in August 2014 at which time she also had an upper endoscopy. The colonoscopy was normal with an unremarkable anastomosis. The anastomosis was narrow and the scope did not pass proximally. Biopsy was were taken and revealed benign small bowel. Upper endoscopy was normal with no evidence of Crohn's. She had intermittent symptoms which were felt to be due to benign stricturing. In July 2015 she had an episode of right lower quadrant pain associated with constipation and vomiting which resolved in one or 2 days. He was doing well until this admission when she developed severe right lower quadrant pain. She was initially seen at [facility] and had an x-ray that revealed a small bowel obstruction. She was then advised to come to [facility] so that she could be evaluated by a gastroenterologist. CT of the abdomen and pelvis showed a section of colon inflammation involving the ascending colon. There was inflammation of the distal small bowel for approximately 10-15 cm in length. Another section of small bowel appeared thick walled in the central to left central abdomen. No abscess. No free air. There is mild dilation of the proximal small bowel, which appears to be due to adynamic ileus with no convincing obstruction. She was also noted to have chronic intra-and extrahepatic bile duct dilatation which appeared stable  from prior scans. She was treated with IV steroids and started on Pentecost soft. She was discharged home on September 22 on prednisone 40 mg daily and Pentasa 250 mg 4 capsules 4 times daily. She returns today for follow-up. She has been not experiencing any nausea or vomiting and has much less abdominal pain. Her stools remain loose to mushy and occur 5-8 times per day with nocturnal stooling. She has not had any bright red blood per rectum or melena. She ran out of Pentasa.   Past Medical History  Diagnosis Date  . Osteoporosis, unspecified   . Seizure disorder ([place])   . Malignant neoplasm of breast (female), unspecified site     right  . 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 Personal history of colonic polyps     hyperplastic  . Crohn's disease [facility])     Past Surgical History  Procedure Laterality Date  . Colon resection    . Abdominal hysterectomy    . Cholecystectomy    . Breast lumpectomy    . Elbow surgery      left   Family History  Problem Relation Age of Onset  . Breast cancer Maternal Aunt   . Heart disease Mother   . Aneurysm Father   . Colon cancer Neg Hx    Social History  Substance Use Topics  . Smoking status: Never Smoker   . Smokeless tobacco: Never Used  . Alcohol Use: No   Current Outpatient Prescriptions  Medication Sig Dispense Refill  . Acetaminophen (TYLENOL PO) Take 1 tablet by mouth at bedtime as needed (sleep).    Marland Kitchen  aspirin EC 81 MG tablet Take 81 mg by mouth daily.    . Calcium Carb-Cholecalciferol (CALCIUM 1000 + D PO) Take 1,000 mg by mouth 2 (two) times daily.    . Cholecalciferol (VITAMIN D) 1000 UNITS capsule Take 1,000 Units by mouth 2 (two) times daily.      . Cranberry 500 MG CAPS Take 500 mg by mouth daily.    . cyanocobalamin (,VITAMIN B-12,) 1000 MCG/ML injection Inject 1,000 mcg into the muscle every 30 (thirty) days. Last injection August 03, 2015    . phenytoin (DILANTIN) 100 MG ER capsule Take 200 mg by  mouth 2 (two) times daily.     . phenytoin (DILANTIN) 30 MG ER capsule Take 1 capsule at bedtime. Take together with Phenytoin 2110m at bedtime for total of 2355mat bedtime (Patient taking differently: Take 30 mg by mouth See admin instructions. Take 1 capsule (30 mg) daily at bedtime with 2 100 mg capsules for a 230 mg bedtime dose) 30 capsule 11  . potassium chloride SA (K-DUR,KLOR-CON) 20 MEQ tablet Take 40 mEq by mouth 2 (two) times daily.     . predniSONE (DELTASONE) 20 MG tablet 3 1/2 tab by mouth daily x 7 days, 3 tabs by mouth daily x 7 days, 2 1/2 tabs by mouth x 7 days,2 tabs by mouth x 7 days, 1 1/2 tabs by [place] x 7 days, 1 tab by mouth x 7 days, 1/2 tab by mouth x 7 days then stop. 100 tablet 0  . mesalamine (PENTASA) 250 MG CR capsule Take 4 capsules (1,000 mg total) by mouth 4 (four) times daily. 480 capsule 3  . ondansetron (ZOFRAN) 4 MG tablet Take 1 tablet (4 mg total) by mouth every 6 (six) hours as needed for nausea. (Patient not taking: Reported on 09/17/2015) 20 tablet 0   No current facility-administered medications for this visit.   Allergies  Allergen Reactions  . Ampicillin Diarrhea  . Penicillins Diarrhea    Has patient had a PCN reaction causing immediate rash, facial/tongue/throat swelling, SOB or lightheadedness with hypotension: No Has patient had a PCN reaction causing severe rash involving mucus membranes or skin necrosis: No Has patient had a PCN reaction that required hospitalization No Has patient had a PCN reaction occurring within the last 10 years: No If all of the above answers are "NO", then may proceed with Cephalosporin use.  . Pneumococcal Vac Polyvalent Other (See Comments)    Local swelling  . Tetanus Toxoid Swelling    Arm swelling     Review of Systems: Gen: Denies any fever, chills, sweats, anorexia, fatigue, weakness, malaise, weight loss, and sleep disorder CV: Denies chest pain, angina, palpitations, syncope, orthopnea, PND, peripheral  edema, and claudication. Resp: Denies dyspnea at rest, dyspnea with exercise, cough, sputum, wheezing, coughing up blood, and pleurisy. GI: Denies vomiting blood, jaundice, and fecal incontinence.   Denies dysphagia or odynophagia. GU : Denies urinary burning, blood in urine, urinary frequency, urinary hesitancy, nocturnal urination, and urinary incontinence. MS: Denies joint pain, limitation of movement, and swelling, stiffness, low back pain, extremity pain. Denies muscle weakness, cramps, atrophy.  Derm: Denies rash, itching, dry skin, hives, moles, warts, or unhealing ulcers.  Psych: Denies depression, anxiety, memory loss, suicidal ideation, hallucinations, paranoia, and confusion. Heme: Denies bruising, bleeding, and enlarged lymph nodes. Neuro:  Denies any headaches, dizziness, paresthesia Endo:  Denies any problems with DM, thyroid, adrenal  LAB RESULTS:  Recent Labs  09/17/15 1428  WBC 6.0  HGB  12.0  HCT 36.5  PLT 164.0   C reactive protein on 09/17/2015 0.1. C reactive protein on 09/02/2015  36.98 Blood work on 09/01/2015 hepatitis B surface antibody less than 3.1, inconsistent with immunity Hepatitis a total antibody negative Hepatitis C antibody negative Quantiferon TB gold negative on 09/01/2015  Studies:   Ct Abdomen Pelvis W Contrast  08/31/2015   CLINICAL DATA:  HPI: 69 year old with past medical history of Crohn's disease, remote ileocecectomy, was recently on 6-MP which was discontinued because of pancytopenia came into the ED at [facility] complaining about 2 days history of crampy abdominal pain, no bowel movements, nausea and vomiting 1. Abdominal x-rays showed proximal small bowel obstruction. Past medical Hx; Seizure Disorder; Colonic Polyps; Crohns Disease  EXAM: CT ABDOMEN AND PELVIS WITH CONTRAST  TECHNIQUE: Multidetector CT imaging of the abdomen and pelvis was performed using the standard protocol following bolus administration of intravenous contrast.   CONTRAST:  66m OMNIPAQUE IOHEXOL 300 MG/ML  SOLN  COMPARISON:  Current abdominal radiographs.  CT, 06/22/2013.  FINDINGS: Gastrointestinal: There is contrast throughout a mildly distended colon. There is an area focal narrowing of the ascending colon with associated wall thickening and adjacent mesenteric inflammation and a small amount of adjacent fluid. Wall measures 6 mm in thickness in this location. No other colonic wall thickening. A portion of the inferior ascending colon extends to the left inguinal region lateral to the left external iliac vessels. This does not cause obstruction. The distal small bowel hat and for approximately 10-15 cm proximal to the ileocolic anastomosis is thick-walled with a maximal wall thickness of 7 mm. There is adjacent mesenteric inflammation and small amount of fluid. There is another area of small bowel wall thickening in the left central abdomen. Bowel proximal to this is mildly dilated with a maximum dilation of 3.2 cm.  Liver: Multiple intrahepatic dilated ducts could there is significant dilation of the common bile duct measuring 13 mm. There is relatively abrupt distal tapering in the pancreatic head. Mild prominence is noted of the pancreatic duct. These findings are stable from the prior CT. No liver mass or focal lesion.  Spleen: Borderline enlarged measuring 12 cm in greatest dimension. No splenic mass or focal lesion.  Adrenal glands: No masses.  Kidneys, ureters, bladder: Mild bilateral renal cortical thinning. Multiple bilateral low-density renal masses consistent with cysts. No hydronephrosis. Ureters normal course and caliber. Bladder is unremarkable.  Uterus and adnexa:  Uterus surgically absent.  No pelvic masses.  Lymph nodes:  No pathologically enlarged lymph nodes.  There is no free intraperitoneal air.  Ascites: Trace ascites is seen adjacent to the liver as well as adjacent to the inflamed right colon and distal small bowel.  Musculoskeletal: Diffusely  demineralized bony structures. No osteoblastic or osteolytic lesions.  IMPRESSION: 1. Findings consistent with active Crohn's disease. There is a section of colon inflammation involving the ascending colon. There is inflammation of the distal small bowel for approximately 10-15 cm in length. Another sections small bowel appears thick walled in the central to left central abdomen. No abscess. No free air. 2. There is mild dilation of proximal small bowel, which appears to be due to adynamic ileus. There is no convincing obstruction. 3. Trace amount of ascites. 4. Chronic intern extrahepatic bile duct dilation. 5. Borderline splenomegaly. 6. Multiple small renal cysts.  Status post hysterectomy.   Electronically Signed   By: DLajean ManesM.D.   On: 08/31/2015 21:12     Physical Exam:  BP 112/64 mmHg  Pulse 72  Ht 5' 2"  (1.575 m)  Wt 102 lb 0.2 oz (46.273 kg)  BMI 18.65 kg/m2 General: Pleasant, well developed , female in no acute distress Head: Normocephalic and atraumatic Eyes:  sclerae anicteric, conjunctiva pink  Ears: Normal auditory acuity Lungs: Clear throughout to auscultation Heart: Regular rate and rhythm Abdomen: Soft, non distended, mild right lower quadrant abdominal tenderness to palpation with no rebound or guarding, No masses, no hepatomegaly. Normal bowel sounds Musculoskeletal: Symmetrical with no gross deformities  Extremities: No edema  Neurological: Alert oriented x 4, grossly nonfocal Psychological:  Alert and cooperative. Normal mood and affect  Assessment and Recommendations: 69 year old female with a history of Crohn's status post ileocecostomy admitted with abdominal pain and abnormal GI imaging consistent with active Crohn's disease. Patient is to be scheduled for a colonoscopy to assess for disease activity. The risks, benefits, and alternatives to colonoscopy with possible biopsy and possible polypectomy were discussed with the patient and they consent to proceed.  The  procedure will be scheduled with Dr. Hilarie Fredrickson. In the meantime, patient will continue prednisone 40 mg for one more day and then will start 35 mg daily for one week and decrease by 5 mg weekly to complete. She has been given a prescription for a refill for Pentasa 250 mg, 4 capsules 4 times daily. CBC and CRP will be obtained today. Further recommendations will be made pending the findings of the above.    Cystal Shannahan, Vita Barley PA-C 09/17/2015,   Addendum: Reviewed and agree with management. Jerene Bears, MD

## 2015-10-15 NOTE — Interval H&P Note (Signed)
History and Physical Interval Note: For colonoscopy today Recent Crohn's ileocolitis flare Tapering prednisone now to 20 mg daily, on pentasa Symptoms have resolved from bowel standpt The nature of the procedure, as well as the risks, benefits, and alternatives were carefully and thoroughly reviewed with the patient. Ample time for discussion and questions allowed. The patient understood, was satisfied, and agreed to proceed.     10/15/2015 1:13 PM  [name]  has presented today for surgery, with the diagnosis of Crohn's disease  The various methods of treatment have been discussed with the patient and family. After consideration of risks, benefits and other options for treatment, the patient has consented to  Procedure(s): COLONOSCOPY WITH PROPOFOL (N/A) as a surgical intervention .  The patient's history has been reviewed, patient examined, no change in status, stable for surgery.  I have reviewed the patient's chart and labs.  Questions were answered to the patient's satisfaction.     Ell Tiso M

## 2015-10-15 NOTE — Anesthesia Preprocedure Evaluation (Signed)
Anesthesia Evaluation  Patient identified by MRN, date of birth, ID band Patient awake    Reviewed: Allergy & Precautions, NPO status , Patient's Chart, lab work & pertinent test results  Airway Mallampati: II  TM Distance: >3 FB Neck ROM: Full    Dental no notable dental hx.    Pulmonary neg pulmonary ROS,    Pulmonary exam normal breath sounds clear to auscultation       Cardiovascular negative cardio ROS Normal cardiovascular exam Rhythm:Regular Rate:Normal     Neuro/Psych Seizures -, Well Controlled,  negative psych ROS   GI/Hepatic Neg liver ROS, chrohns dz   Endo/Other  negative endocrine ROS  Renal/GU negative Renal ROS  negative genitourinary   Musculoskeletal  (+) steroids,    Abdominal   Peds negative pediatric ROS (+)  Hematology negative hematology ROS (+)   Anesthesia Other Findings   Reproductive/Obstetrics negative OB ROS                             Anesthesia Physical Anesthesia Plan  ASA: III  Anesthesia Plan: MAC   Post-op Pain Management:    Induction: Intravenous  Airway Management Planned: Nasal Cannula  Additional Equipment:   Intra-op Plan:   Post-operative Plan:   Informed Consent: I have reviewed the patients History and Physical, chart, labs and discussed the procedure including the risks, benefits and alternatives for the proposed anesthesia with the patient or authorized representative who has indicated his/her understanding and acceptance.   Dental advisory given  Plan Discussed with: CRNA and Surgeon  Anesthesia Plan Comments:         Anesthesia Quick Evaluation

## 2015-10-16 ENCOUNTER — Encounter: Payer: Self-pay | Admitting: Internal Medicine

## 2015-10-16 ENCOUNTER — Encounter (HOSPITAL_COMMUNITY): Payer: Self-pay | Admitting: Internal Medicine

## 2015-10-16 NOTE — Anesthesia Postprocedure Evaluation (Signed)
  Anesthesia Post-op Note  Patient: [name]  Procedure(s) Performed: Procedure(s) (LRB): COLONOSCOPY WITH PROPOFOL (N/A)  Patient Location: PACU  Anesthesia Type: MAC  Level of Consciousness: awake and alert   Airway and Oxygen Therapy: Patient Spontanous Breathing  Post-op Pain: mild  Post-op Assessment: Post-op Vital signs reviewed, Patient's Cardiovascular Status Stable, Respiratory Function Stable, Patent Airway and No signs of Nausea or vomiting  Last Vitals:  Filed Vitals:   10/15/15 1515  BP:   Pulse: 75  Temp:   Resp: 18    Post-op Vital Signs: stable   Complications: No apparent anesthesia complications

## 2015-12-19 ENCOUNTER — Encounter: Payer: Self-pay | Admitting: *Deleted

## 2015-12-19 ENCOUNTER — Ambulatory Visit (INDEPENDENT_AMBULATORY_CARE_PROVIDER_SITE_OTHER): Payer: Medicare Other | Admitting: Internal Medicine

## 2015-12-19 VITALS — BP 102/60 | HR 74 | Ht 61.0 in | Wt 100.0 lb

## 2015-12-19 DIAGNOSIS — K5080 Crohn's disease of both small and large intestine without complications: Secondary | ICD-10-CM | POA: Diagnosis not present

## 2015-12-19 MED ORDER — MESALAMINE ER 500 MG PO CPCR: 1000.0000 mg | ORAL_CAPSULE | Freq: Three times a day (TID) | ORAL | Status: DC

## 2015-12-19 NOTE — Progress Notes (Signed)
Subjective:    Patient ID: [name], female    DOB: 24-Feb-1946, 70 y.o.   MRN: [id number]  HPI [name] is a 70 year old female with ileocolonic Crohn's disease who is seen for follow-up. She was last seen in the office in October 2016 by Cecille Rubin Hvozdovic PA-C which was after hospitalization in September for active Crohn's disease of the distal ileum and right colon with ileus. She was treated with IV steroids in the hospital and then tapered off prednisone slowly. She was also started on Pentasa and remains on 1 g 4 times daily. Colonoscopy was performed on 10/15/2015. This showed mild erythema and luminal narrowing at the ileocolonic anastomosis which was biopsied. There was slight edema and scattered erythema without definite evidence of active colonic Crohn's disease throughout the colon. Surveillance biopsies were performed a small sessile polyp was removed from the transverse colon. Biopsies of the ileum showed chronic inactive colitis consistent with IBD. Right colon biopsies showed benign colon mucosa negative for dysplasia. The polyp was benign colonic mucosa without adenoma. The left colon biopsy showed chronic inactive colitis consistent with IBD without dysplasia.  Today she reports from a Crohn's perspective she is doing very well. Her abdominal pain has completely resolved. Bowel movements have returned more normal where previously she was having loose mushy stools 5-6 times daily, worse postprandially. Now she is having 3-4 more formed stools per day. No blood, melena or mucus. No fevers or chills.  She did have pneumonia/upper respiratory infection in mid December which lasted several weeks. She was treated with antibiotics and a very short prednisone taper plus an inhaler. This has resolved. She reports she had gained weight prior to her URI but lost some which is now coming back. Appetite has returned to normal. No nausea or vomiting. She is now off  antibiotics   Review of Systems As per HPI, otherwise negative  Current Medications, Allergies, Past Medical History, Past Surgical History, Family History and Social History were reviewed in [employer] record.     Objective:   Physical Exam BP 102/60 mmHg  Pulse 74  Ht 5' 1"  (1.549 m)  Wt 100 lb (45.36 kg)  BMI 18.90 kg/m2 Constitutional: Well-developed and well-nourished. No distress. HEENT: Normocephalic and atraumatic.  Conjunctivae are normal.  No scleral icterus. Neck: Neck supple. Trachea midline. Cardiovascular: Normal rate, regular rhythm and intact distal pulses.  Pulmonary/chest: Effort normal and breath sounds normal. No wheezing, rales or rhonchi. Abdominal: Soft, nontender, nondistended. Bowel sounds active throughout. There are no masses palpable. No hepatosplenomegaly. Extremities: no clubbing, cyanosis, or edema Neurological: Alert and oriented to person place and time. Skin: Skin is warm and dry. No rashes noted. Psychiatric: Normal mood and affect. Behavior is normal.  CT ABDOMEN AND PELVIS WITH CONTRAST   TECHNIQUE: Multidetector CT imaging of the abdomen and pelvis was performed using the standard protocol following bolus administration of intravenous contrast.   CONTRAST:  21m OMNIPAQUE IOHEXOL 300 MG/ML  SOLN   COMPARISON:  Current abdominal radiographs.  CT, 06/22/2013.   FINDINGS: Gastrointestinal: There is contrast throughout a mildly distended colon. There is an area focal narrowing of the ascending colon with associated wall thickening and adjacent mesenteric inflammation and a small amount of adjacent fluid. Wall measures 6 mm in thickness in this location. No other colonic wall thickening. A portion of the inferior ascending colon extends to the left inguinal region lateral to the left external iliac vessels. This does not cause obstruction. The distal  small bowel hat and for approximately 10-15 cm proximal to the  ileocolic anastomosis is thick-walled with a maximal wall thickness of 7 mm. There is adjacent mesenteric inflammation and small amount of fluid. There is another area of small bowel wall thickening in the left central abdomen. Bowel proximal to this is mildly dilated with a maximum dilation of 3.2 cm.   Liver: Multiple intrahepatic dilated ducts could there is significant dilation of the common bile duct measuring 13 mm. There is relatively abrupt distal tapering in the pancreatic head. Mild prominence is noted of the pancreatic duct. These findings are stable from the prior CT. No liver mass or focal lesion.   Spleen: Borderline enlarged measuring 12 cm in greatest dimension. No splenic mass or focal lesion.   Adrenal glands: No masses.   Kidneys, ureters, bladder: Mild bilateral renal cortical thinning. Multiple bilateral low-density renal masses consistent with cysts. No hydronephrosis. Ureters normal course and caliber. Bladder is unremarkable.   Uterus and adnexa:  Uterus surgically absent.  No pelvic masses.   Lymph nodes:  No pathologically enlarged lymph nodes.   There is no free intraperitoneal air.   Ascites: Trace ascites is seen adjacent to the liver as well as adjacent to the inflamed right colon and distal small bowel.   Musculoskeletal: Diffusely demineralized bony structures. No osteoblastic or osteolytic lesions.   IMPRESSION: 1. Findings consistent with active Crohn's disease. There is a section of colon inflammation involving the ascending colon. There is inflammation of the distal small bowel for approximately 10-15 cm in length. Another sections small bowel appears thick walled in the central to left central abdomen. No abscess. No free air. 2. There is mild dilation of proximal small bowel, which appears to be due to adynamic ileus. There is no convincing obstruction. 3. Trace amount of ascites. 4. Chronic intern extrahepatic bile duct dilation. 5.  Borderline splenomegaly. 6. Multiple small renal cysts.  Status post hysterectomy.     Electronically Signed   By: Lajean Manes M.D.   On: 08/31/2015 21:12      Assessment & Plan:  70 year old female with ileocolonic Crohn's disease who is seen for follow-up  1. Ileocolonic Crohn's disease -- significant flare documented by CT in September, improved by the time of colonoscopy in November. All biopsies showed inactive disease which correlates with her symptoms as currently she is clinically in remission. Fortunately this has been with mesalamine alone after prednisone taper. We discussed that if symptoms come back, certainly should she require another hospitalization, my recommendation would be for escalation of therapy to include a biologic. She understands this but hope she will not needed. She is tolerating Pentasa well. I will decrease slightly the dose to help decrease pill burden and number of doses per day. Decrease Pentasa to 1 g 3 times a day. She understands this will likely be a long-term medication for her. Call with any return of Crohn's symptoms. --Six-month follow-up, sooner if necessary  25 minutes spent with the patient today. Greater than 50% was spent in counseling and coordination of care with the patient

## 2015-12-19 NOTE — Patient Instructions (Signed)
We have given you samples of Pentasa 500 mg to take 2 capsule (1000 mg) three times daily. Once out of samples, please return to your normal Pentasa dosage of 250 mg capsules (4 capsules) three times daily.  Call our office if you have abdominal pain, diarrhea or flare symptoms.  Please follow up with Dr Hilarie Fredrickson in 6 months.

## 2015-12-29 ENCOUNTER — Ambulatory Visit (INDEPENDENT_AMBULATORY_CARE_PROVIDER_SITE_OTHER): Payer: Medicare Other | Admitting: Neurology

## 2015-12-29 ENCOUNTER — Encounter: Payer: Self-pay | Admitting: Neurology

## 2015-12-29 VITALS — BP 110/64 | HR 83 | Resp 14 | Ht 62.0 in | Wt 102.0 lb

## 2015-12-29 DIAGNOSIS — G40309 Generalized idiopathic epilepsy and epileptic syndromes, not intractable, without status epilepticus: Secondary | ICD-10-CM

## 2015-12-29 MED ORDER — LEVETIRACETAM ER 500 MG PO TB24: ORAL_TABLET | ORAL | Status: DC

## 2015-12-29 MED ORDER — PHENYTOIN SODIUM EXTENDED 100 MG PO CAPS: ORAL_CAPSULE | ORAL | Status: DC

## 2015-12-29 NOTE — Progress Notes (Signed)
NEUROLOGY FOLLOW UP OFFICE NOTE  [name] [id number]  HISTORY OF PRESENT ILLNESS: I had the pleasure of seeing [name] in follow-up in the neurology clinic on 12/29/2015.  The patient was last seen 8 months ago for seizures. She is taking Dilantin 238m in AM, 2378min PM with no seizures since 2013. She has had thrombocytopenia, and recent Hematology consult, it was felt that it could be related to Dilantin or slightly increased spleen size. She presents today expressing interest but hesitation to switch medications, however shows me bruises on both hands and arms. She denies any headaches, dizziness, diplopia, gait instability, focal numbness/tingling/weakness, staring/unresponsive episodes, gaps in time.   Dilantin level on 43036may 04/07/15: 13.5  HPI: This is a pleasant 70 25 RH woman with a history of Crohn's disease, uterine cancer, and breast cancer s/p lumpectomy, chemo and radiation therapy 5 years ago, who started having seizures at age [age]he recalls going to the bathroom then back to bed early morning, then her next recollection was being brought to the hospital. Her husband heard her scream out followed by whole body stiffening and shaking lasting 2-3 minutes, with associated tongue bite and urinary incontinence. She was brought to the hospital and not started on medication. Six months after, she had a second convulsion out of sleep with similar screaming out and body stiffening and shaking. She was again brought to the hospital, and per patient, not started on medication. A month later, she had a third nocturnal convulsion and saw her PCP who started her on Dilantin 400m79my. She may or may not have seen a neurologist, she is unsure, but does not recall having an EEG done. She recalls being told that she had UTIs when she had the seizures. She was seizure-free for 9 years until 2013 when she forgot to take her medication and had another nocturnal  convulsion where she fractured her right arm. Dilantin dose was increased to 600mg63m. After a few months, she started having dizziness and difficulty ambulating. She went to the ER where he Dilantin level was supratherapeutic and she was instructed to stop the medication for a few days then restart at 200mg 71m She has been doing well since with no seizures and no further side effects. She and her husband deny any staring/unresponsive episodes, no gaps in time, olfactory/gustatory hallucinations, deja vu, rising epigastric sensation, focal numbness/tingling/weakness, myoclonic jerks. She denies any dizziness, diplopia, gait instability, gum problems. She had a DEXA scan in 11/2012 and has a diagnosis of osteoporosis. She denies any headaches, dysarthria, dysphagia, neck/back pain, bowel/bladder dysfunction. No chest pain or shortness of breath. She is driving.  Epilepsy Risk Factors: Her maternal aunt had seizures, otherwise she had a normal birth and early development. There is no history of febrile convulsions, CNS infections such as meningitis/encephalitis, significant traumatic brain injury, neurosurgical procedures.  Prior AEDs: none EEG 05/01/14: occasional independent focal slowing over the bilateral temporal regions, left greater than right. Imaging: Head CT without contrast done 07/13/2013 reported as unremarkable.  PAST MEDICAL HISTORY: Past Medical History  Diagnosis Date  . Osteoporosis, unspecified   . Seizure disorder (HCC)  [place] of breast (female), unspecified site     right  . Malignant neoplasm of corpus uteri, except isthmus (HCC)  [place] of colonic polyps     hyperplastic  . Crohn's disease (HCC)  [place]   . Bowel obstruction (HCC)  [place])  [place]  Epilepsy ([place])   . Pneumonia     MEDICATIONS: Current Outpatient Prescriptions on File Prior to Visit  Medication Sig Dispense Refill  . aspirin EC 81 MG tablet  Take 81 mg by mouth daily.    . Calcium Carb-Cholecalciferol (CALCIUM 1000 + D PO) Take 1,000 mg by mouth 2 (two) times daily.    . Cholecalciferol (VITAMIN D) 1000 UNITS capsule Take 1,000 Units by mouth 2 (two) times daily.      . Cranberry 500 MG CAPS Take 500 mg by mouth daily.    . cyanocobalamin (,VITAMIN B-12,) 1000 MCG/ML injection Inject 1,000 mcg into the muscle every 30 (thirty) days.     . diphenhydramine-acetaminophen (TYLENOL PM) 25-500 MG TABS tablet Take 1 tablet by mouth at bedtime as needed.    . mesalamine (PENTASA) 250 MG CR capsule Take 4 capsules (1,000 mg total) by mouth 4 (four) times daily. 480 capsule 3  . phenytoin (DILANTIN) 100 MG ER capsule Take 200 mg by mouth 2 (two) times daily.     . phenytoin (DILANTIN) 30 MG ER capsule Take 1 capsule at bedtime. Take together with Phenytoin 238m at bedtime for total of 2338mat bedtime (Patient taking differently: Take 30 mg by mouth. with two 100 mg capsules for a 230 mg bedtime dose) 30 capsule 11  . potassium chloride SA (K-DUR,KLOR-CON) 20 MEQ tablet Take 40 mEq by mouth 2 (two) times daily.     . pravastatin (PRAVACHOL) 10 MG tablet Take 5 mg by mouth daily.     No current facility-administered medications on file prior to visit.    ALLERGIES: Allergies  Allergen Reactions  . Ampicillin Diarrhea  . Penicillins Diarrhea    Has patient had a PCN reaction causing immediate rash, facial/tongue/throat swelling, SOB or lightheadedness with hypotension: No Has patient had a PCN reaction causing severe rash involving mucus membranes or skin necrosis: No Has patient had a PCN reaction that required hospitalization No Has patient had a PCN reaction occurring within the last 10 years: No If all of the above answers are "NO", then may proceed with Cephalosporin use.  . Pneumococcal Vac Polyvalent Other (See Comments)    Local swelling  . Tetanus Toxoid Swelling    Arm swelling    FAMILY HISTORY: Family History  Problem  Relation Age of Onset  . Breast cancer Maternal Aunt   . Heart disease Mother   . Aneurysm Father   . Colon cancer Neg Hx     SOCIAL HISTORY: Social History   Social History  . Marital Status: Married    Spouse Name: N/A  . Number of Children: 2  . Years of Education: N/A   Occupational History  . Retired    Social History Main Topics  . Smoking status: Never Smoker   . Smokeless tobacco: Never Used  . Alcohol Use: No  . Drug Use: No  . Sexual Activity: Yes    Birth Control/ Protection: None   Other Topics Concern  . Not on file   Social History Narrative    REVIEW OF SYSTEMS: Constitutional: No fevers, chills, or sweats, no generalized fatigue, change in appetite Eyes: No visual changes, double vision, eye pain Ear, nose and throat: No hearing loss, ear pain, nasal congestion, sore throat Cardiovascular: No chest pain, palpitations Respiratory:  No shortness of breath at rest or with exertion, wheezes GastrointestinaI: No nausea, vomiting, diarrhea, abdominal pain, fecal incontinence Genitourinary:  No dysuria, urinary retention or frequency Musculoskeletal:  No neck  pain, back pain Integumentary: No rash, pruritus, skin lesions Neurological: as above Psychiatric: No depression, insomnia, anxiety Endocrine: No palpitations, fatigue, diaphoresis, mood swings, change in appetite, change in weight, increased thirst Hematologic/Lymphatic:  No anemia, purpura, petechiae. Allergic/Immunologic: no itchy/runny eyes, nasal congestion, recent allergic reactions, rashes  PHYSICAL EXAM: Filed Vitals:   12/29/15 1550  BP: 110/64  Pulse: 83  Resp: 14   General: No acute distress Head:  Normocephalic/atraumatic Neck: supple, no paraspinal tenderness, full range of motion Heart:  Regular rate and rhythm Lungs:  Clear to auscultation bilaterally Back: No paraspinal tenderness Skin/Extremities: No rash, no edema Neurological Exam: alert and oriented to person, place, and  time. No aphasia or dysarthria. Fund of knowledge is appropriate.  Recent and remote memory are intact. 3/3 delayed recall.  Attention and concentration are normal.    Able to name objects and repeat phrases. Cranial nerves: Pupils equal, round, reactive to light.  Extraocular movements intact with no nystagmus. Visual fields full. Facial sensation intact. No facial asymmetry. Tongue, uvula, palate midline.  Motor: Bulk and tone normal, muscle strength 5/5 throughout with no pronator drift.  Sensation to light touch intact.  No extinction to double simultaneous stimulation.  Deep tendon reflexes 2+ throughout, toes downgoing.  Finger to nose testing intact.  Gait narrow-based and steady, able to tandem walk adequately.  Romberg negative.  IMPRESSION: This is a pleasant 70 yo RH woman with a history of Crohn's disease, uterine cancer, breast cancer s/p lumpectomy, chemo, and radiation therapy 5 years ago, with a history of 4 convulsive seizures that occur out of sleep since age 25 or 68. Last convulsion was in 2013. Her neurological exam is normal, head CT in 2014 unremarkable, EEG had shown independent focal slowing over the bilateral temporal regions, no epileptiform discharges. She is currently on Dilantin 250m in AM, 2360min PM. There has been concern that thrombocytopenia is possibly due to Dilantin. She has easy bruisability. Last platelet count in October 2016 was 125. She is agreeable to switching to a different AED, she will be switched to Levetircetam ER 50033mhs x 2 weeks, then increase to 1000m61ms. Side effects were discussed. She will continue on Dilantin for now, but after finishing 30mg43mscription, she will stay on 200mg 27muntil her next visit, at which point we will do a slow taper off Dilantin. She is aware of [place] driving laws to stop driving after a seizure, until 6 months seizure-free. She will follow-up in 1 month and knows to call our office for any problems.  Thank you for allowing  me to participate in her care.  Please do not hesitate to call for any questions or concerns.  The duration of this appointment visit was 25 minutes of face-to-face time with the patient.  Greater than 50% of this time was spent in counseling, explanation of diagnosis, planning of further management, and coordination of care.   Karen Ellouise Newer   CC: Dr. SmithTamala Julian

## 2015-12-29 NOTE — Patient Instructions (Signed)
1. Start Levetiracetam ER 571m: Take 1 tablet at night for 2 weeks, then increase to 2 tablets at night 2. Continue Dilantin 2048min AM, 23050mn PM until you run out of the 57m63mblets. After you run out, continue on Dilantin 200mg49mce a day 3. Follow-up in 1 month, call for any problems

## 2015-12-30 ENCOUNTER — Telehealth: Payer: Self-pay | Admitting: Oncology

## 2015-12-30 ENCOUNTER — Other Ambulatory Visit: Payer: Self-pay | Admitting: Oncology

## 2015-12-30 NOTE — Telephone Encounter (Signed)
Spoke with patient and she is aware of her new appointments per pof

## 2016-01-05 ENCOUNTER — Other Ambulatory Visit: Payer: Medicare Other

## 2016-01-05 ENCOUNTER — Ambulatory Visit: Payer: Medicare Other | Admitting: Oncology

## 2016-01-16 ENCOUNTER — Other Ambulatory Visit: Payer: Self-pay

## 2016-01-16 DIAGNOSIS — D696 Thrombocytopenia, unspecified: Secondary | ICD-10-CM

## 2016-01-18 ENCOUNTER — Other Ambulatory Visit: Payer: Self-pay | Admitting: Oncology

## 2016-01-19 ENCOUNTER — Other Ambulatory Visit (HOSPITAL_BASED_OUTPATIENT_CLINIC_OR_DEPARTMENT_OTHER): Payer: Medicare Other

## 2016-01-19 ENCOUNTER — Encounter: Payer: Self-pay | Admitting: Oncology

## 2016-01-19 ENCOUNTER — Telehealth: Payer: Self-pay | Admitting: Oncology

## 2016-01-19 ENCOUNTER — Ambulatory Visit (HOSPITAL_BASED_OUTPATIENT_CLINIC_OR_DEPARTMENT_OTHER): Payer: Medicare Other | Admitting: Oncology

## 2016-01-19 VITALS — BP 118/69 | HR 79 | Temp 97.6°F | Resp 18 | Ht 62.0 in | Wt 105.8 lb

## 2016-01-19 DIAGNOSIS — Z8542 Personal history of malignant neoplasm of other parts of uterus: Secondary | ICD-10-CM

## 2016-01-19 DIAGNOSIS — D696 Thrombocytopenia, unspecified: Secondary | ICD-10-CM

## 2016-01-19 DIAGNOSIS — Z79899 Other long term (current) drug therapy: Secondary | ICD-10-CM

## 2016-01-19 DIAGNOSIS — Z853 Personal history of malignant neoplasm of breast: Secondary | ICD-10-CM

## 2016-01-19 LAB — CBC WITH DIFFERENTIAL/PLATELET
BASO%: 0.5 % (ref 0.0–2.0)
BASOS ABS: 0 10*3/uL (ref 0.0–0.1)
EOS%: 1.4 % (ref 0.0–7.0)
Eosinophils Absolute: 0.1 10*3/uL (ref 0.0–0.5)
HEMATOCRIT: 33.9 % — AB (ref 34.8–46.6)
HEMOGLOBIN: 11.2 g/dL — AB (ref 11.6–15.9)
LYMPH#: 1.2 10*3/uL (ref 0.9–3.3)
LYMPH%: 31.8 % (ref 14.0–49.7)
MCH: 30.9 pg (ref 25.1–34.0)
MCHC: 33 g/dL (ref 31.5–36.0)
MCV: 93.6 fL (ref 79.5–101.0)
MONO#: 0.3 10*3/uL (ref 0.1–0.9)
MONO%: 7.7 % (ref 0.0–14.0)
NEUT#: 2.1 10*3/uL (ref 1.5–6.5)
NEUT%: 58.6 % (ref 38.4–76.8)
NRBC: 0 % (ref 0–0)
Platelets: 108 10*3/uL — ABNORMAL LOW (ref 145–400)
RBC: 3.62 10*6/uL — ABNORMAL LOW (ref 3.70–5.45)
RDW: 15.2 % — AB (ref 11.2–14.5)
WBC: 3.7 10*3/uL — ABNORMAL LOW (ref 3.9–10.3)

## 2016-01-19 LAB — MORPHOLOGY: PLT EST: DECREASED

## 2016-01-19 NOTE — Progress Notes (Signed)
OFFICE PROGRESS NOTE   January 19, 2016   Physicians: Estrella Deeds, NP with [facility]; Zenovia Jarred, Ellouise Newer, J.Kerr  Cindie Laroche)  Outside lab and imaging reports reviewed for this visit and will be scanned into this EMR.   INTERVAL HISTORY:  Patient is seen, together with husband, in continuing attention to gradually progressive thrombocytopenia, which has not been symptomatic. She has had some low platelet counts at least intermittently back to 2011, which are earliest records I have found here. Etiology is not clear, however may be related to medications and other medical problems. Note she had CMF chemotherapy in 1990. Pertinent imaging includes abdominal US done in [facility] system by PCP ~ 01-12-16 "spleen 9.9 cm, negative" and no mention of adenopathy.   Patient denies any unusual bruising or bleeding, no fevers or night sweats, appetite at baseline, no noted adenopathy, no abdominal or pelvic pain, no increased SOB or productive cough now, energy getting better since the pneumonia. Bowels and bladder unchanged. No new or different pain. Remainder of 10 point Review of Systems negative/ unchanged.    She has seizure disorder on dilantin x ~ 15 years, now followed by Dr Delice Lesch and working off of dilantin. She began keppra on 12-29-15 and has decreased dilantin dose slightly as directed; she is to see Dr Delice Lesch again on 01-29-16. No seizures since adjustment of meds; last seizure ~ 4 years ago She had flare of Crohn's, on ~ 6 week taper of steroids after hospitalization for this Sept 2016. She had been off steroids for 3-4 years prior to this flare. 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 She saw Dr Hilarie Fredrickson most recently in 12-2015 and will see him again in 6 months. She has not had any symptomatic flares of Crohn's since treatment in fall 2016.  She also sees Dr Buddy Duty for osteoporosis. She was treated for several weeks in 11-2015 as  outpatient for URI and pneumonia, completed antibiotics and has resolved those symptoms.   ONCOLOGIC HISTORY T1N0 right breast cancer in 1990, on observation since completing adjuvant CMF chemotherapy and 5 years of tamoxifen. Mammograms are scheduled at [facility] later this week.  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 .  Objective:  Vital signs in last 24 hours:  BP 118/69 mmHg  Pulse 79  Temp(Src) 97.6 F (36.4 C) (Oral)  Resp 18  Ht 5' 2"  (1.575 m)  Wt 105 lb 12.8 oz (47.991 kg)  BMI 19.35 kg/m2  SpO2 99% Weight up 1 lb. Not in apparent discomfort, respirations not labored RA, no cough during visit. Very pleasant as always, husband helpful with history. Alert, oriented and appropriate. Ambulatory without assistance.    HEENT:PERRL, sclerae not icteric. Oral mucosa moist without lesions, posterior pharynx clear.  Neck supple. No JVD.  Lymphatics:no cervical,supraclavicular, axillary or inguinal adenopathy Resp: clear to auscultation bilaterally and normal percussion bilaterally Cardio: regular rate and rhythm. No gallop. GI: soft, nontender, not distended, no mass or organomegaly, cannot palpate spleen. Normally active bowel sounds.  Musculoskeletal/ Extremities: without pitting edema, cords, tenderness Neuro: speech fluent, no focal deficits. PSYCH appropriate mood and affect Skin without rash, ecchymosis, petechiae Breasts: exam not repeated today. Axillae unremarkable   Lab Results:  Results for orders placed or performed in visit on 01/19/16  CBC with Differential  Result Value Ref Range   WBC 3.7 (L) 3.9 - 10.3 10e3/uL   NEUT# 2.1 1.5 - 6.5 10e3/uL  HGB 11.2 (L) 11.6 - 15.9 g/dL   HCT 33.9 (L) 34.8 - 46.6 %   Platelets 108 (L) 145 - 400 10e3/uL   MCV 93.6 79.5 - 101.0 fL   MCH 30.9 25.1 - 34.0 pg   MCHC 33.0 31.5 - 36.0 g/dL   RBC 3.62 (L) 3.70 - 5.45 10e6/uL   RDW 15.2 (H) 11.2 - 14.5 %   lymph# 1.2 0.9 -  3.3 10e3/uL   MONO# 0.3 0.1 - 0.9 10e3/uL   Eosinophils Absolute 0.1 0.0 - 0.5 10e3/uL   Basophils Absolute 0.0 0.0 - 0.1 10e3/uL   NEUT% 58.6 38.4 - 76.8 %   LYMPH% 31.8 14.0 - 49.7 %   MONO% 7.7 0.0 - 14.0 %   EOS% 1.4 0.0 - 7.0 %   BASO% 0.5 0.0 - 2.0 %   nRBC 0 0 - 0 %  Morphology  Result Value Ref Range   Polychromasia Slight Slight   Ovalocytes Few Negative   Helmet Cell Oc Negative   White Cell Comments Oc variant lymph    PLT EST Decreased Adequate    Specifically no giant platelets seen, lymphocytes not elevated  Hepatic function panel [facility] system 12-31-15 Alb 3.6 T prot 6.5 Tbili 0.3 AST 45 ALT 51 AP 112  Studies/Results:  Outside reports from [facility] system , to be scanned into this EMR:  Abdominal US ~ 01-12-16:  Liver parenchymal echogenicity slightly coarsened and heterogeneous suggesting steatosis, spleen 9.9 cm negative, 7 mm cyst left hepatic lobe, 8 mm peripancreatic lymph node corresponding to CT 08-2015, no hydronephrosis, no mention of other adenopathy  CXR 12-24-15 interim near complete clearing of RLL infiltrate compated with 12-01-15  Medications: I have reviewed the patient's current medications. Still on dilantin 200 mg bid, mesalamine, Keppra once daily  DISCUSSION Discussed general categories of thrombocytopenia causes. Recent abdominal US does not show enlarged spleen so platelets are not being sequestered there.  Blood smear today does not show unusually large platelets as would be expected in ITP. CMF chemotherapy is not high risk for later bone marrow problems, tho that could still be possible. I do not find other indications of lymphoma on information as above. Most likely still seems medication related, including the dilantin. We appreciate Dr Delice Lesch directing change from the long term dilantin to [place] now. As patient is having no bleeding problems and with medication changes in process, I have suggested following counts for now. I did mention bone  marrow exam, which we would need to consider if platelets continue to decrease. I will see her back in 3 months or sooner if needed, and patient/ husband know to call if any concerns.    Assessment/Plan: 1.variable, mildly low platelet counts back at least to 2011: see discussion above, could be related to dilantin which is not being weaned with plans to DC/ change to keppra I will see her in 3 months with counts, or prior if she or other physicians have concerns.  2.history of T1N0 right breast cancer at age 21 in 50: treated with lumpectomy, CMF, radiation and 5 years tamoxifen. No known disease since then. Yearly mammograms 3.Stage 1 grade 1 endometrial cancer treated surgically 2007, no adjuvant therapy required. Note previous tamoxifen history, which may have been related. Clinically and by recent scans still NED. 4.Crohn's disease with flare 08-2015 : completed prednisone slow taper over ~ 8 weeks, continues  mesalamine. Still very careful diet. Note platelets did not increase on steroids 5.seizure disorder on dilantin x ~  15 years, now followed by Dr Delice Lesch with plans to work off dilantin 6.low bone density by previous DEXA and comment on imaging during recent hospitalization. May have had more recent DEXA by PCP, needs yearly per Dr Delice Lesch 7.flu vaccine 09-01-15 8.pneumonia 11-2015, resolved   All questions answered. CC Drs Bobette Mo, [facility], Buddy Duty. Time spent 25 min including >50% counseling and coordination of care.   Eiliana Drone P, MD   01/19/2016, 3:45 PM

## 2016-01-19 NOTE — Telephone Encounter (Signed)
Appointments made and avs printed °

## 2016-01-23 ENCOUNTER — Ambulatory Visit: Payer: Medicare Other | Admitting: Neurology

## 2016-01-29 ENCOUNTER — Ambulatory Visit (INDEPENDENT_AMBULATORY_CARE_PROVIDER_SITE_OTHER): Payer: Medicare Other | Admitting: Neurology

## 2016-01-29 ENCOUNTER — Encounter: Payer: Self-pay | Admitting: Neurology

## 2016-01-29 VITALS — BP 108/64 | HR 80 | Ht 62.0 in | Wt 107.0 lb

## 2016-01-29 DIAGNOSIS — G40309 Generalized idiopathic epilepsy and epileptic syndromes, not intractable, without status epilepticus: Secondary | ICD-10-CM

## 2016-01-29 DIAGNOSIS — D696 Thrombocytopenia, unspecified: Secondary | ICD-10-CM

## 2016-01-29 MED ORDER — LEVETIRACETAM ER 500 MG PO TB24: ORAL_TABLET | ORAL | Status: DC

## 2016-01-29 MED ORDER — PHENYTOIN SODIUM EXTENDED 100 MG PO CAPS: ORAL_CAPSULE | ORAL | Status: DC

## 2016-01-29 NOTE — Progress Notes (Signed)
NEUROLOGY FOLLOW UP OFFICE NOTE  [name] [id number]  HISTORY OF PRESENT ILLNESS: I had the pleasure of seeing [name] in follow-up in the neurology clinic on 01/29/2016. The patient was last seen 1 month ago due to concern that thrombocytopenia could be related to Dilantin or slightly increased spleen size. She presented expressing interest but hesitation to switch medications. She was taking Dilantin 266m in AM, 2373min PM with no seizures since 2013. She was started on Keppra XR 100046mhs, and Dilantin was tapered to 200m46mD. She denies any side effects on medications, she denies any seizures or seizure-like symptoms. She denies any headaches, dizziness, diplopia, gait instability, focal numbness/tingling/weakness, staring/unresponsive episodes, gaps in time.   Dilantin level on 430mg69m 04/07/15: 13.5  HPI: This is a pleasant 70 yo66H woman with a history of Crohn's disease, uterine cancer, and breast cancer s/p lumpectomy, chemo and radiation therapy 5 years ago, who started having seizures at age 70 or89. [name] recalls going to the bathroom then back to bed early morning, then her next recollection was being brought to the hospital. Her husband heard her scream out followed by whole body stiffening and shaking lasting 2-3 minutes, with associated tongue bite and urinary incontinence. She was brought to the hospital and not started on medication. Six months after, she had a second convulsion out of sleep with similar screaming out and body stiffening and shaking. She was again brought to the hospital, and per patient, not started on medication. A month later, she had a third nocturnal convulsion and saw her PCP who started her on Dilantin 400mg/85m She may or may not have seen a neurologist, she is unsure, but does not recall having an EEG done. She recalls being told that she had UTIs when she had the seizures. She was seizure-free for 9 years until 2013 when she  forgot to take her medication and had another nocturnal convulsion where she fractured her right arm. Dilantin dose was increased to 600mg/d63mAfter a few months, she started having dizziness and difficulty ambulating. She went to the ER where he Dilantin level was supratherapeutic and she was instructed to stop the medication for a few days then restart at 200mg BI2mhe has been doing well since with no seizures and no further side effects. She and her husband deny any staring/unresponsive episodes, no gaps in time, olfactory/gustatory hallucinations, deja vu, rising epigastric sensation, focal numbness/tingling/weakness, myoclonic jerks. She denies any dizziness, diplopia, gait instability, gum problems. She had a DEXA scan in 11/2012 and has a diagnosis of osteoporosis. She denies any headaches, dysarthria, dysphagia, neck/back pain, bowel/bladder dysfunction. No chest pain or shortness of breath. She is driving.  Epilepsy Risk Factors: Her maternal aunt had seizures, otherwise she had a normal birth and early development. There is no history of febrile convulsions, CNS infections such as meningitis/encephalitis, significant traumatic brain injury, neurosurgical procedures.  Prior AEDs: none EEG 05/01/14: occasional independent focal slowing over the bilateral temporal regions, left greater than right. Imaging: Head CT without contrast done 07/13/2013 reported as unremarkable.  PAST MEDICAL HISTORY: Past Medical History  Diagnosis Date  . Osteoporosis, unspecified   . Seizure disorder (HCC)   .Silverdalelignant neoplasm of breast (female), unspecified site     right  . Malignant neoplasm of corpus uteri, except isthmus (HCC)   .Concordiarsonal history of colonic polyps     hyperplastic  . Crohn's disease (HCC)   .Hopkinsnal cyst   . Bowel  obstruction ([place])   . Thrombocytopenia ([place])   . Epilepsy ([place])   . Pneumonia     MEDICATIONS: Current Outpatient Prescriptions on File Prior to Visit    Medication Sig Dispense Refill  . aspirin EC 81 MG tablet Take 81 mg by mouth daily.    . Calcium Carb-Cholecalciferol (CALCIUM 1000 + D PO) Take 1,000 mg by mouth 2 (two) times daily.    . Cholecalciferol (VITAMIN D) 1000 UNITS capsule Take 1,000 Units by mouth 2 (two) times daily.      . Cranberry 500 MG CAPS Take 500 mg by mouth daily.    . cyanocobalamin (,VITAMIN B-12,) 1000 MCG/ML injection Inject 1,000 mcg into the muscle every 30 (thirty) days.     . diphenhydramine-acetaminophen (TYLENOL PM) 25-500 MG TABS tablet Take 1 tablet by mouth at bedtime as needed. Reported on 01/19/2016    . levETIRAcetam (KEPPRA XR) 500 MG 24 hr tablet Take 1 tablet at night for 2 weeks, then increase to 2 tablets at night (Patient taking differently: Take by mouth. Take 2 tablets at bedtime) 60 tablet 6  . mesalamine (PENTASA) 500 MG CR capsule Take 500 mg by mouth 3 (three) times daily.    . phenytoin (DILANTIN) 100 MG ER capsule Take 2 capsules in AM, 2 capsules in PM 120 capsule 3  . potassium chloride SA (K-DUR,KLOR-CON) 20 MEQ tablet Take 40 mEq by mouth 2 (two) times daily.     . pravastatin (PRAVACHOL) 10 MG tablet Take 5 mg by mouth daily.     No current facility-administered medications on file prior to visit.    ALLERGIES: Allergies  Allergen Reactions  . Ampicillin Diarrhea  . Penicillins Diarrhea    Has patient had a PCN reaction causing immediate rash, facial/tongue/throat swelling, SOB or lightheadedness with hypotension: No Has patient had a PCN reaction causing severe rash involving mucus membranes or skin necrosis: No Has patient had a PCN reaction that required hospitalization No Has patient had a PCN reaction occurring within the last 10 years: No If all of the above answers are "NO", then may proceed with Cephalosporin use.  . Pneumococcal Vac Polyvalent Other (See Comments)    Local swelling  . Tetanus Toxoid Swelling    Arm swelling    FAMILY HISTORY: Family History   Problem Relation Age of Onset  . Breast cancer Maternal Aunt   . Heart disease Mother   . Aneurysm Father   . Colon cancer Neg Hx     SOCIAL HISTORY: Social History   Social History  . Marital Status: Married    Spouse Name: N/A  . Number of Children: 2  . Years of Education: N/A   Occupational History  . Retired    Social History Main Topics  . Smoking status: Never Smoker   . Smokeless tobacco: Never Used  . Alcohol Use: No  . Drug Use: No  . Sexual Activity: Yes    Birth Control/ Protection: None   Other Topics Concern  . Not on file   Social History Narrative    REVIEW OF SYSTEMS: Constitutional: No fevers, chills, or sweats, no generalized fatigue, change in appetite Eyes: No visual changes, double vision, eye pain Ear, nose and throat: No hearing loss, ear pain, nasal congestion, sore throat Cardiovascular: No chest pain, palpitations Respiratory:  No shortness of breath at rest or with exertion, wheezes GastrointestinaI: No nausea, vomiting, diarrhea, abdominal pain, fecal incontinence Genitourinary:  No dysuria, urinary retention or frequency Musculoskeletal:  No  neck pain, back pain Integumentary: No rash, pruritus, skin lesions Neurological: as above Psychiatric: No depression, insomnia, anxiety Endocrine: No palpitations, fatigue, diaphoresis, mood swings, change in appetite, change in weight, increased thirst Hematologic/Lymphatic:  No anemia, purpura, petechiae. Allergic/Immunologic: no itchy/runny eyes, nasal congestion, recent allergic reactions, rashes  PHYSICAL EXAM: Filed Vitals:   01/29/16 1053  BP: 108/64  Pulse: 80   General: No acute distress Head:  Normocephalic/atraumatic Neck: supple, no paraspinal tenderness, full range of motion Heart:  Regular rate and rhythm Lungs:  Clear to auscultation bilaterally Back: No paraspinal tenderness Skin/Extremities: No rash, no edema Neurological Exam: alert and oriented to person, place, and  time. No aphasia or dysarthria. Fund of knowledge is appropriate.  Recent and remote memory are intact. 3/3 delayed recall.  Attention and concentration are normal.    Able to name objects and repeat phrases. Cranial nerves: Pupils equal, round, reactive to light.  Extraocular movements intact with no nystagmus. Visual fields full. Facial sensation intact. No facial asymmetry. Tongue, uvula, palate midline.  Motor: Bulk and tone normal, muscle strength 5/5 throughout with no pronator drift.  Sensation to light touch intact.  No extinction to double simultaneous stimulation.  Deep tendon reflexes 2+ throughout, toes downgoing.  Finger to nose testing intact.  Gait narrow-based and steady, able to tandem walk adequately.  Romberg negative.  IMPRESSION: This is a pleasant 70 yo RH woman with a history of Crohn's disease, uterine cancer, breast cancer s/p lumpectomy, chemo, and radiation therapy 5 years ago, with a history of 4 convulsive seizures that occur out of sleep since age 1 or 68. Last convulsion was in 2013. Her neurological exam is normal, head CT in 2014 unremarkable, EEG had shown independent focal slowing over the bilateral temporal regions, no epileptiform discharges. There has been concern that thrombocytopenia is possibly due to Dilantin. She has easy bruisability. Last platelet count on 01/19/16 was 108. We will continue with Dilantin taper, she will start tapering by 1 capsule every month. Continue Keppra XR 1050m qhs. We discussed that if she has a breakthrough seizure during Dilantin taper, Keppra dose will be increased to 15039mqhs. She is aware of [place] driving laws to stop driving after a seizure, until 6 months seizure-free. She will follow-up in 7 months and knows to call our office for any problems.  Thank you for allowing me to participate in her care.  Please do not hesitate to call for any questions or concerns.  The duration of this appointment visit was 25 minutes of face-to-face  time with the patient.  Greater than 50% of this time was spent in counseling, explanation of diagnosis, planning of further management, and coordination of care.   KaEllouise NewerM.D.   CC: Dr. SmTamala JulianDr. LiMarko Plume

## 2016-01-29 NOTE — Patient Instructions (Signed)
1. Continue Keppra XR 542m: take 2 tablets at night 2. Start reducing Dilantin 1039m Month 1: Take 1 cap in AM, 2 caps in PM Month 2: Take 1 cap twice a day Month 3: Take 1 cap daily Month 4: Take 1 cap every other day Month 5: stop medication 3. If any changes in symptoms, call our office 4. Follow-up in 7 months  Seizure Precautions: 1. If medication has been prescribed for you to prevent seizures, take it exactly as directed.  Do not stop taking the medicine without talking to your doctor first, even if you have not had a seizure in a long time.   2. Avoid activities in which a seizure would cause danger to yourself or to others.  Don't operate dangerous machinery, swim alone, or climb in high or dangerous places, such as on ladders, roofs, or girders.  Do not drive unless your doctor says you may.  3. If you have any warning that you may have a seizure, lay down in a safe place where you can't hurt yourself.    4.  No driving for 6 months from last seizure, as [facility]  Please refer to the following link on the Ep[place] for more information: http://[email or web address].org/[email or web address]/driving/drivingu.cfm   5.  Maintain good sleep hygiene. Avoid alcohol.  6.  Contact your doctor if you have any problems that may be related to the medicine you are taking.  7.  Call 911 and bring the patient back to the ED if:        A.  The seizure lasts longer than 5 minutes.       B.  The patient doesn't awaken shortly after the seizure  C.  The patient has new problems such as difficulty seeing, speaking or moving  D.  The patient was injured during the seizure  E.  The patient has a temperature over 102 F (39C)  F.  The patient vomited and now is having trouble breathing

## 2016-02-09 ENCOUNTER — Encounter: Payer: Self-pay | Admitting: Oncology

## 2016-04-11 ENCOUNTER — Other Ambulatory Visit: Payer: Self-pay | Admitting: Oncology

## 2016-04-11 ENCOUNTER — Other Ambulatory Visit: Payer: Self-pay

## 2016-04-11 DIAGNOSIS — D696 Thrombocytopenia, unspecified: Secondary | ICD-10-CM

## 2016-04-12 ENCOUNTER — Telehealth: Payer: Self-pay | Admitting: Oncology

## 2016-04-12 ENCOUNTER — Ambulatory Visit (HOSPITAL_BASED_OUTPATIENT_CLINIC_OR_DEPARTMENT_OTHER): Payer: Medicare Other

## 2016-04-12 ENCOUNTER — Encounter: Payer: Self-pay | Admitting: Oncology

## 2016-04-12 ENCOUNTER — Ambulatory Visit (HOSPITAL_BASED_OUTPATIENT_CLINIC_OR_DEPARTMENT_OTHER): Payer: Medicare Other | Admitting: Oncology

## 2016-04-12 VITALS — BP 114/63 | HR 73 | Temp 97.7°F | Resp 17 | Ht 62.0 in | Wt 108.8 lb

## 2016-04-12 DIAGNOSIS — Z853 Personal history of malignant neoplasm of breast: Secondary | ICD-10-CM

## 2016-04-12 DIAGNOSIS — D696 Thrombocytopenia, unspecified: Secondary | ICD-10-CM

## 2016-04-12 DIAGNOSIS — Z8542 Personal history of malignant neoplasm of other parts of uterus: Secondary | ICD-10-CM

## 2016-04-12 LAB — CBC WITH DIFFERENTIAL/PLATELET
BASO%: 0.3 % (ref 0.0–2.0)
BASOS ABS: 0 10*3/uL (ref 0.0–0.1)
EOS%: 0.9 % (ref 0.0–7.0)
Eosinophils Absolute: 0 10*3/uL (ref 0.0–0.5)
HEMATOCRIT: 31.5 % — AB (ref 34.8–46.6)
HGB: 10.9 g/dL — ABNORMAL LOW (ref 11.6–15.9)
LYMPH#: 1.1 10*3/uL (ref 0.9–3.3)
LYMPH%: 34.4 % (ref 14.0–49.7)
MCH: 30.7 pg (ref 25.1–34.0)
MCHC: 34.6 g/dL (ref 31.5–36.0)
MCV: 88.7 fL (ref 79.5–101.0)
MONO#: 0.2 10*3/uL (ref 0.1–0.9)
MONO%: 5.3 % (ref 0.0–14.0)
NEUT#: 1.9 10*3/uL (ref 1.5–6.5)
NEUT%: 59.1 % (ref 38.4–76.8)
NRBC: 0 % (ref 0–0)
PLATELETS: 120 10*3/uL — AB (ref 145–400)
RBC: 3.55 10*6/uL — ABNORMAL LOW (ref 3.70–5.45)
RDW: 13.9 % (ref 11.2–14.5)
WBC: 3.2 10*3/uL — ABNORMAL LOW (ref 3.9–10.3)

## 2016-04-12 LAB — MORPHOLOGY: PLT EST: DECREASED

## 2016-04-12 NOTE — Telephone Encounter (Signed)
Gave and printed appt sched and avs for pt for Aug

## 2016-04-12 NOTE — Progress Notes (Signed)
OFFICE PROGRESS NOTE   Apr 12, 2016   [place], NP with [facility]; Zenovia Jarred, Ellouise Newer, J.Kerr Cindie Laroche)  INTERVAL HISTORY:  Patient is seen, together with husband, in follow up of mild thrombocytopenia which has been present at least intermittently at least back to 2011.  Etiology of the thrombocytopenia is not clear, possibly medication related including dilantin (which is being tapered off), also had CMF chemotherapy in 1990. Pertinent imaging includes abdominal US done in [facility] system by PCP ~ 01-12-16 "spleen 9.9 cm, negative" and no mention of adenopathy. Platelets did not seem to increase when she was on steroids for Crohn's flare 08-2015.  Last colonoscopy was by Dr Hilarie Fredrickson 10-15-15 chronic inactive colitis; she saw him in 12-2015 and will see him again in 6 months.  She will see Dr Delice Lesch in 08-2016 after completing present dilantin taper. She will see PCP in June  Patient has not been sick since several weeks of respiratory infection/ pneumonia in 11-2015. She continues to taper down dilantin, presently on 100 mg at hs, then will go to 100 mg qod in midMay prior to DC in ~ July. She has had no seizure activity on the dilantin taper, is on keppra now. She has had no flare of the Crohn's since she was here last. She denies any bleeding, tho does bruise with slight trauma. She is busy with youngest grandchild, energy at baseline, no new or different pain, appetite good. She denies fever, sweats, abdominal or pelvic pain, swelling LE, SOB. Remainder of 10 point Review of Systems negative/ unchanged.    ONCOLOGIC HISTORY T1N0 right breast cancer in 1990, on observation since completing adjuvant CMF chemotherapy and 5 years of tamoxifen. Mammograms done at [facility] 01-2016, report requested now by [facility]  She also has history of IB grade 1 endometrial cancer treated surgically 2007.  Objective:  Vital signs in last 24 hours:  BP 114/63 mmHg  Pulse 73   Temp(Src) 97.7 F (36.5 C) (Oral)  Resp 17  Ht 5' 2"  (1.575 m)  Wt 108 lb 12.8 oz (49.351 kg)  BMI 19.89 kg/m2  SpO2 98% Weight up 3 lbs Alert, oriented and appropriate. Ambulatory without difficulty. Respirations not labored RA. Looks comfortable  HEENT:PERRL, sclerae not icteric. Oral mucosa moist without lesions, posterior pharynx clear. Poor dentition unchanged.  Neck supple. No JVD.  Lymphatics:no cervical,supraclavicular, axillary or inguinal adenopathy Resp: clear to auscultation bilaterally and normal percussion bilaterally Cardio: regular rate and rhythm. No gallop. GI: soft, nontender, not distended, no mass or organomegaly. Normally active bowel sounds. Surgical incision not remarkable. Musculoskeletal/ Extremities: without pitting edema, cords, tenderness Neuro: no peripheral neuropathy. Otherwise nonfocal. PSYCH appropriate mood and affect Skin without rash, petechiae. Some scattered small ecchymoses on forearms.   Lab Results:  Results for orders placed or performed in visit on 04/12/16  CBC with Differential  Result Value Ref Range   WBC 3.2 (L) 3.9 - 10.3 10e3/uL   NEUT# 1.9 1.5 - 6.5 10e3/uL   HGB 10.9 (L) 11.6 - 15.9 g/dL   HCT 31.5 (L) 34.8 - 46.6 %   Platelets 120 (L) 145 - 400 10e3/uL   MCV 88.7 79.5 - 101.0 fL   MCH 30.7 25.1 - 34.0 pg   MCHC 34.6 31.5 - 36.0 g/dL   RBC 3.55 (L) 3.70 - 5.45 10e6/uL   RDW 13.9 11.2 - 14.5 %   lymph# 1.1 0.9 - 3.3 10e3/uL   MONO# 0.2 0.1 - 0.9 10e3/uL  Eosinophils Absolute 0.0 0.0 - 0.5 10e3/uL   Basophils Absolute 0.0 0.0 - 0.1 10e3/uL   NEUT% 59.1 38.4 - 76.8 %   LYMPH% 34.4 14.0 - 49.7 %   MONO% 5.3 0.0 - 14.0 %   EOS% 0.9 0.0 - 7.0 %   BASO% 0.3 0.0 - 2.0 %   nRBC 0 0 - 0 %  Morphology  Result Value Ref Range   Polychromasia Slight Slight   Ovalocytes Few Negative   White Cell Comments C/W auto diff    PLT EST Decreased Adequate     Studies/Results:  No results found.  Medications: I have reviewed  the patient's current medications. Dilantin on taper by Dr Delice Lesch, should complete by mid- late July She is on low dose ASA, begun prophylactically by previous PCP Dr Sherol Dade. She prefers to continue this, tho understands that she should hold ASA and let me know if any bleeding.   DISCUSSION CBC information discussed.     Assessment/Plan:  1.variable, mildly low platelet counts back at least to 2011: see discussion above, could be related to dilantin which is being weaned with plans to DC. WBC and hemoglobin slightly lower than 3 mo and 6 mo ago, differential and morphology not remarkable. I will see her in Aug after dilantin is [facility], or prior if she or other physicians have concerns. If counts concerning then will recommend bone marrow evaluation.  2.history of T1N0 right breast cancer at age 22 in 70: treated with lumpectomy, CMF, radiation and 5 years tamoxifen. No known disease since then. Yearly mammograms done at [facility] in ~Feb, report to be requested by [facility]  3.Stage 1 grade 1 endometrial cancer treated surgically 2007, no adjuvant therapy required. Note previous tamoxifen history, which may have been related. Clinically and by recent scans still NED. 4.Crohn's disease with flare 08-2015 : completed prednisone slow taper over ~ 8 weeks, continues mesalamine. Still very careful diet. Note platelets did not increase on steroids 5.seizure disorder on dilantin x ~ 15 years, now followed by Dr Delice Lesch with plans to work off dilantin 6.low bone density by previous DEXA and comment on imaging during recent hospitalization. May have had more recent DEXA by PCP, needs yearly per Dr Delice Lesch 7.flu vaccine 09-01-15 8.pneumonia 11-2015, resolved   All questions answered. Patient and husband much prefer to follow up after dilantin DCd rather than any other evaluation prior. Time spent 20 min including >50% counseling and coordination of care. Route PCP, in EPIC for Drs Delice Lesch and  Nolon Stalls, MD   04/12/2016, 8:16 PM

## 2016-04-20 ENCOUNTER — Ambulatory Visit: Payer: Medicare Other | Admitting: Neurology

## 2016-04-23 ENCOUNTER — Other Ambulatory Visit: Payer: Self-pay

## 2016-04-23 MED ORDER — MESALAMINE ER 250 MG PO CPCR: 1000.0000 mg | ORAL_CAPSULE | Freq: Three times a day (TID) | ORAL | Status: DC

## 2016-04-26 ENCOUNTER — Telehealth: Payer: Self-pay | Admitting: Internal Medicine

## 2016-04-26 NOTE — Telephone Encounter (Signed)
Dr Archer Asa advise.Marland KitchenMarland Kitchen

## 2016-04-26 NOTE — Telephone Encounter (Signed)
What mesalamine prep does her insurance prefer

## 2016-04-27 MED ORDER — MESALAMINE 1.2 G PO TBEC: 2.4000 g | DELAYED_RELEASE_TABLET | Freq: Every day | ORAL | Status: DC

## 2016-04-27 MED ORDER — MESALAMINE ER 250 MG PO CPCR: 1000.0000 mg | ORAL_CAPSULE | Freq: Three times a day (TID) | ORAL | Status: DC

## 2016-04-27 NOTE — Telephone Encounter (Signed)
After contacting patient, I have been made aware that the Pentasa cost itself is not the problem. Patient was only given #120 tablets which only lasts 10 days (she takes 12 tablets daily). She would like to remain on Pentasa but just needs 30 day supply until appointment. I have resent Pentasa to pharmacy and have asked that Lialda be discontinued.

## 2016-04-27 NOTE — Telephone Encounter (Signed)
Per Dr Vena Rua verbal orders, we can try to give Lialda 2.4 grams daily in place of Pentasa to see if insurance will cover that medication better. Rx sent.

## 2016-05-06 ENCOUNTER — Encounter: Payer: Self-pay | Admitting: Oncology

## 2016-05-06 NOTE — Progress Notes (Signed)
Medical Oncology  Report of bilateral mammograms dated 01-21-16 received from [facility] [place], after this office requested. Report to be scanned into this EMR. Not listed as tomo  In summary Breast tissue heterogeneously dense. Stable post surgical changes on right, no findings suspicious for malignancy bilaterally. Recommended screening mammogram one year.  Godfrey Pick, MD

## 2016-06-28 ENCOUNTER — Encounter: Payer: Self-pay | Admitting: Internal Medicine

## 2016-06-28 ENCOUNTER — Ambulatory Visit (INDEPENDENT_AMBULATORY_CARE_PROVIDER_SITE_OTHER): Payer: Medicare Other | Admitting: Internal Medicine

## 2016-06-28 VITALS — BP 100/70 | HR 80 | Ht 62.0 in | Wt 110.8 lb

## 2016-06-28 DIAGNOSIS — K5080 Crohn's disease of both small and large intestine without complications: Secondary | ICD-10-CM

## 2016-06-28 DIAGNOSIS — D61818 Other pancytopenia: Secondary | ICD-10-CM | POA: Diagnosis not present

## 2016-06-28 MED ORDER — MESALAMINE ER 250 MG PO CPCR: 1000.0000 mg | ORAL_CAPSULE | Freq: Three times a day (TID) | ORAL | Status: DC

## 2016-06-28 NOTE — Patient Instructions (Signed)
We have sent the following medications to your pharmacy for you to pick up at your convenience: Pentasa 1 gram (4 tablets) three times daily  Please follow up with Dr Hilarie Fredrickson in 6 months.  If you are age 72 or older, your body mass index should be between 23-30. Your Body mass index is 20.26 kg/(m^2). If this is out of the aforementioned range listed, please consider follow up with your Primary Care Provider.  If you are age 68 or younger, your body mass index should be between 19-25. Your Body mass index is 20.26 kg/(m^2). If this is out of the aformentioned range listed, please consider follow up with your Primary Care Provider.

## 2016-06-28 NOTE — Progress Notes (Signed)
Subjective:    Patient ID: [name], female    DOB: 1946-03-10, 70 y.o.   MRN: [id number]  HPI [name] is a 70 year old female with ileocolonic Crohn's disease who is here for follow-up. She was last seen in the office in January 2017 and she was hospitalized in September for active Crohn's flare. This was treated with prednisone taper and she has been taking Pentasa 1 g 3 times a day. She reports that on the whole she continues to do well from a GI standpoint. She had one day of right-sided discomfort which she felt was related to eating shredded hashbrowns. She has been eating a fairly low residue diet. This pain resolved on its own. Bowel movements have been regular though at times they can be loose. Only one to 2 times per day. No blood or melena. Other than that single day she denies abdominal pain. She does experience borborygmi but this is not painful. She reports a good appetite and reports that she has gained 10 pounds. No nausea or vomiting.  She does follow with Dr. Marko Plume for her history of breast cancer. She's also been followed for thrombocytopenia and most recently seems to have developed a mild pancytopenia. She has oncology follow-up on 07/26/2016. Her Dilantin was changed to Her thinking this may be responsible for thrombocytopenia.  Colonoscopy was performed on 10/15/2015. This showed mild erythema and luminal narrowing at the ileocolonic anastomosis which was biopsied. There was slight edema and scattered erythema without definite evidence of active colonic Crohn's disease throughout the colon. Surveillance biopsies were performed a small sessile polyp was removed from the transverse colon. Biopsies of the ileum showed chronic inactive colitis consistent with IBD. Right colon biopsies showed benign colon mucosa negative for dysplasia. The polyp was benign colonic mucosa without adenoma. The left colon biopsy showed chronic inactive colitis consistent with IBD  without dysplasia.   Review of Systems As per history of present illness, otherwise negative  Current Medications, Allergies, Past Medical History, Past Surgical History, Family History and Social History were reviewed in [employer] record.     Objective:   Physical Exam BP 100/70 mmHg  Pulse 80  Ht 5' 2"  (1.575 m)  Wt 110 lb 12.8 oz (50.259 kg)  BMI 20.26 kg/m2 Constitutional: Well-developed and well-nourished. No distress. HEENT: Normocephalic and atraumatic. Conjunctivae are normal.  No scleral icterus. Neck: Neck supple. Trachea midline. Cardiovascular: Normal rate, regular rhythm and intact distal pulses. No M/R/G Pulmonary/chest: Effort normal and breath sounds normal. No wheezing, rales or rhonchi. Abdominal: Soft, nontender, nondistended. Bowel sounds active throughout. There are no masses palpable. No hepatosplenomegaly. Extremities: no clubbing, cyanosis, or edema Lymphadenopathy: No cervical adenopathy noted. Neurological: Alert and oriented to person place and time. Skin: Skin is warm and dry. No rashes noted. Psychiatric: Normal mood and affect. Behavior is normal.  CBC    Component Value Date/Time   WBC 3.2* 04/12/2016 1228   WBC 6.0 09/17/2015 1428   RBC 3.55* 04/12/2016 1228   RBC 3.86* 09/17/2015 1428   HGB 10.9* 04/12/2016 1228   HGB 12.0 09/17/2015 1428   HCT 31.5* 04/12/2016 1228   HCT 36.5 09/17/2015 1428   PLT 120* 04/12/2016 1228   PLT 164.0 09/17/2015 1428   MCV 88.7 04/12/2016 1228   MCV 94.5 09/17/2015 1428   MCH 30.7 04/12/2016 1228   MCH 31.2 09/04/2015 0427   MCHC 34.6 04/12/2016 1228   MCHC 32.9 09/17/2015 1428   RDW 13.9 04/12/2016 1228  RDW 15.9* 09/17/2015 1428   LYMPHSABS 1.1 04/12/2016 1228   LYMPHSABS 0.7 09/17/2015 1428   MONOABS 0.2 04/12/2016 1228   MONOABS 0.1 09/17/2015 1428   EOSABS 0.0 04/12/2016 1228   EOSABS 0.0 09/17/2015 1428   BASOSABS 0.0 04/12/2016 1228   BASOSABS 0.0 09/17/2015 1428        Assessment & Plan:  70 year old female with ileocolonic Crohn's disease who is here for follow-up.  1. Ileocolonic Crohn's disease -- over the last 6 months she has done well. I am very pleased that she has been able to remain well from a GI standpoint on mesalamine alone. We had previously discussed escalation of therapy to a biologic but she would prefer to avoid this was absolutely necessary. She has not required steroids since her hospitalization. I recommended we continue Pentasa 1 g 3 times a day and her low residue diet. She is asked to call me with she develops any recurrent abdominal pain, diarrhea or blood in her stool. She voices understanding  2. Pancytopenia -- follow-up with Dr. Marko Plume scheduled and per chart review possible consideration of bone marrow biopsy.  6 month ROV, sooner if needed 25 minutes spent with the patient today. Greater than 50% was spent in counseling and coordination of care with the patient

## 2016-07-25 ENCOUNTER — Other Ambulatory Visit: Payer: Self-pay | Admitting: Oncology

## 2016-07-25 DIAGNOSIS — Z853 Personal history of malignant neoplasm of breast: Secondary | ICD-10-CM

## 2016-07-26 ENCOUNTER — Other Ambulatory Visit (HOSPITAL_BASED_OUTPATIENT_CLINIC_OR_DEPARTMENT_OTHER): Payer: Medicare Other

## 2016-07-26 ENCOUNTER — Ambulatory Visit (HOSPITAL_BASED_OUTPATIENT_CLINIC_OR_DEPARTMENT_OTHER): Payer: Medicare Other | Admitting: Oncology

## 2016-07-26 ENCOUNTER — Encounter: Payer: Self-pay | Admitting: Oncology

## 2016-07-26 VITALS — BP 138/74 | HR 72 | Temp 97.5°F | Resp 18 | Ht 62.0 in | Wt 112.4 lb

## 2016-07-26 DIAGNOSIS — Z9221 Personal history of antineoplastic chemotherapy: Secondary | ICD-10-CM

## 2016-07-26 DIAGNOSIS — G40909 Epilepsy, unspecified, not intractable, without status epilepticus: Secondary | ICD-10-CM

## 2016-07-26 DIAGNOSIS — Z79899 Other long term (current) drug therapy: Secondary | ICD-10-CM

## 2016-07-26 DIAGNOSIS — Z853 Personal history of malignant neoplasm of breast: Secondary | ICD-10-CM

## 2016-07-26 DIAGNOSIS — G4089 Other seizures: Secondary | ICD-10-CM

## 2016-07-26 DIAGNOSIS — Z8542 Personal history of malignant neoplasm of other parts of uterus: Secondary | ICD-10-CM

## 2016-07-26 DIAGNOSIS — D696 Thrombocytopenia, unspecified: Secondary | ICD-10-CM

## 2016-07-26 DIAGNOSIS — K5090 Crohn's disease, unspecified, without complications: Secondary | ICD-10-CM | POA: Diagnosis not present

## 2016-07-26 LAB — CBC WITH DIFFERENTIAL/PLATELET
BASO%: 0.5 % (ref 0.0–2.0)
Basophils Absolute: 0 10*3/uL (ref 0.0–0.1)
EOS ABS: 0 10*3/uL (ref 0.0–0.5)
EOS%: 1.1 % (ref 0.0–7.0)
HCT: 37.4 % (ref 34.8–46.6)
HGB: 12.1 g/dL (ref 11.6–15.9)
LYMPH%: 29.3 % (ref 14.0–49.7)
MCH: 29.9 pg (ref 25.1–34.0)
MCHC: 32.3 g/dL (ref 31.5–36.0)
MCV: 92.5 fL (ref 79.5–101.0)
MONO#: 0.1 10*3/uL (ref 0.1–0.9)
MONO%: 3.9 % (ref 0.0–14.0)
NEUT#: 2.4 10*3/uL (ref 1.5–6.5)
NEUT%: 65.2 % (ref 38.4–76.8)
PLATELETS: 97 10*3/uL — AB (ref 145–400)
RBC: 4.04 10*6/uL (ref 3.70–5.45)
RDW: 14.4 % (ref 11.2–14.5)
WBC: 3.7 10*3/uL — ABNORMAL LOW (ref 3.9–10.3)
lymph#: 1.1 10*3/uL (ref 0.9–3.3)

## 2016-07-26 LAB — COMPREHENSIVE METABOLIC PANEL
ALBUMIN: 3.7 g/dL (ref 3.5–5.0)
ALT: 27 U/L (ref 0–55)
AST: 25 U/L (ref 5–34)
Alkaline Phosphatase: 77 U/L (ref 40–150)
Anion Gap: 9 mEq/L (ref 3–11)
BILIRUBIN TOTAL: 0.6 mg/dL (ref 0.20–1.20)
BUN: 8.3 mg/dL (ref 7.0–26.0)
CALCIUM: 9.2 mg/dL (ref 8.4–10.4)
CO2: 22 meq/L (ref 22–29)
CREATININE: 1.1 mg/dL (ref 0.6–1.1)
Chloride: 107 mEq/L (ref 98–109)
EGFR: 52 mL/min/{1.73_m2} — AB (ref >90)
GLUCOSE: 138 mg/dL (ref 70–140)
Potassium: 3.7 mEq/L (ref 3.5–5.1)
Sodium: 137 mEq/L (ref 136–145)
TOTAL PROTEIN: 7.1 g/dL (ref 6.4–8.3)

## 2016-07-26 LAB — MORPHOLOGY: PLT EST: DECREASED

## 2016-07-26 NOTE — Progress Notes (Signed)
OFFICE PROGRESS NOTE   July 26, 2016   Physicians: Estrella Deeds, NP with [facility]; Zenovia Jarred, Ellouise Newer, J.Kerr Cindie Laroche)   INTERVAL HISTORY:  Patient is seen, together with husband, in follow up of low blood counts, also history of stage I right breast cancer 1990 not known recurrent and IB grade 1 endometrial cancer 2007.  She saw Dr Hilarie Fredrickson 06-28-16 in follow up of Crohn's, doing well, next visit 6 months.  Per patient, had CXR and CT chest at [facility] by PCP for question of pulmonary findings on exam, then evaluation with PFTs (?) by pulmonary in [employer], no problems identified and f/u 1 year. She is to see Dr Delice Lesch next in early Sept.   Patient has had mild thrombocytopenia since at least 2011, slightly lower recently but no bleeding or bruising, has been thought possibly related to meds including dilantin. When she was seen in 04-2016, WBC/ ANC and hemoglobin were also a little lower, however dilantin was being weaned off so that we decided to recheck following DC of dilantin.  Patient has now been off dilantin since "at least early June" 2017, tolerating new keppra well with no seizures.  She is otherwise feeling well, no fever or symptoms of infection, no bleeding or bruising, appetite and GI function ok, no new or different pain, no SOB or cough, busy with grandchildren.  Remainder of 10 point Review of Systems negative/ unchanged.   She will be at beach with a friend 8-17 x 1 week   ONCOLOGIC HISTORY T1N0 right breast cancer in 1990, on observation since completing adjuvant CMF chemotherapy and 5 years of tamoxifen. Mammograms done at [facility] 01-2016, report requested now by [facility]  She also has history of IB grade 1 endometrial cancer treated surgically 2007.  Mild thrombocytopenia present intermittently at least back to 2011.  Etiology of the thrombocytopenia is not clear, possibly medication related including dilantin, also had CMF  chemotherapy in 1990. Pertinent imaging includes abdominal US done in [facility] system by PCP ~ 01-12-16 "spleen 9.9 cm, negative" and no mention of adenopathy. Platelets did not seem to increase when she was on steroids for Crohn's flare 08-2015.     Objective:  Vital signs in last 24 hours:  BP 138/74 (BP Location: Right Arm, Patient Position: Sitting)   Pulse 72   Temp 97.5 F (36.4 C) (Oral)   Resp 18   Ht 5' 2"  (1.575 m)   Wt 112 lb 6.4 oz (51 kg)   SpO2 100%   BMI 20.56 kg/m  Weight up 4 lbs. Alert, oriented and appropriate. Ambulatory without difficulty.  No alopecia  HEENT:PERRL, sclerae not icteric. Oral mucosa moist without lesions, posterior pharynx clear. Some broken teeth but clean Neck supple. No JVD.  Lymphatics:no cervical,supraclavicular, axillary or inguinal adenopathy Resp: clear to auscultation bilaterally and normal percussion bilaterally Cardio: regular rate and rhythm. No gallop. GI: soft, nontender, not distended, no mass or organomegaly including spleen not palpable. Normally active bowel sounds.  Musculoskeletal/ Extremities: without pitting edema, cords, tenderness Neuro: nonfocal  Psych appropriate mood and affect Skin without rash, ecchymosis, petechiae Breasts exam not repeated  Lab Results:  Results for orders placed or performed in visit on 07/26/16  CBC with Differential  Result Value Ref Range   WBC 3.7 (L) 3.9 - 10.3 10e3/uL   NEUT# 2.4 1.5 - 6.5 10e3/uL   HGB 12.1 11.6 - 15.9 g/dL   HCT 37.4 34.8 - 46.6 %   Platelets  97 (L) 145 - 400 10e3/uL   MCV 92.5 79.5 - 101.0 fL   MCH 29.9 25.1 - 34.0 pg   MCHC 32.3 31.5 - 36.0 g/dL   RBC 4.04 3.70 - 5.45 10e6/uL   RDW 14.4 11.2 - 14.5 %   lymph# 1.1 0.9 - 3.3 10e3/uL   MONO# 0.1 0.1 - 0.9 10e3/uL   Eosinophils Absolute 0.0 0.0 - 0.5 10e3/uL   Basophils Absolute 0.0 0.0 - 0.1 10e3/uL   NEUT% 65.2 38.4 - 76.8 %   LYMPH% 29.3 14.0 - 49.7 %   MONO% 3.9 0.0 - 14.0 %   EOS% 1.1 0.0 - 7.0 %   BASO%  0.5 0.0 - 2.0 %  Morphology  Result Value Ref Range   Ovalocytes Few Negative   White Cell Comments C/W auto diff    PLT EST Decreased Adequate  Comprehensive metabolic panel  Result Value Ref Range   Sodium 137 136 - 145 mEq/L   Potassium 3.7 3.5 - 5.1 mEq/L   Chloride 107 98 - 109 mEq/L   CO2 22 22 - 29 mEq/L   Glucose 138 70 - 140 mg/dl   BUN 8.3 7.0 - 26.0 mg/dL   Creatinine 1.1 0.6 - 1.1 mg/dL   Total Bilirubin 0.60 0.20 - 1.20 mg/dL   Alkaline Phosphatase 77 40 - 150 U/L   AST 25 5 - 34 U/L   ALT 27 0 - 55 U/L   Total Protein 7.1 6.4 - 8.3 g/dL   Albumin 3.7 3.5 - 5.0 g/dL   Calcium 9.2 8.4 - 10.4 mg/dL   Anion Gap 9 3 - 11 mEq/L   EGFR 52 (L) >90 ml/min/1.73 m2     Studies/Results:  No results found.  Medications: I have reviewed the patient's current medications.  DISCUSSION  We are pleased with improvement in WBC and hemoglobin since she was here last, tho platelets a little lower even off dilantin x 2 months. With past chemo exposure, I think bone marrow exam including cytogenetics appropriate to be sure we have all information. She remains asymptomatic from the mild thrombocytopenia, however platelets by our lab just under 100k today. Discussed bone marrow procedure and patient in agreement. Order placed for IR.   Assessment/Plan: 1.variable, mildly low platelet counts back at least to 2011: WBC and hemoglobin have improved since 04-2016, however platelets a little lower even off dilantin for 2 months. Particularly with past chemo exposure, I have recommended bone marrow exam with cytogenetics. Patient prefers sedation for the bone marrow so that I have requested it with IR. I will see her back a couple of weeks after the bone marrow is accomplished, to allow time for cytogenetics information.   2.history of T1N0 right breast cancer at age 26 in 45: treated with lumpectomy, CMF, radiation and 5 years tamoxifen. No known disease since then. Yearly mammograms done at  [facility] 01-21-16 (see scanned report and documentation note 05-06-16). Heterogeneously dense breast tissue noted.  3.Stage 1 grade 1 endometrial cancer treated surgically 2007, no adjuvant therapy required. Note previous tamoxifen history, which may have been related. Clinically and by last scans NED. 4.Crohn's disease with flare 08-2015 : completed prednisone slow taper over ~ 8 weeks, continues mesalamine. Still very careful diet. Note platelets did not increase on steroids 5.seizure disorder on dilantin x ~ 15 years,, weaned off and DCd ~ early June 2017, now on keppra. Dr Delice Lesch following 6.low bone density by previous DEXA and comment on imaging during recent hospitalization.  May have had more recent DEXA by PCP, needs yearly per Dr Delice Lesch 7.needs flu shot this fall   All questions answered. Time spent 20 min including >50% counseling and coordination of care. Route PCP. Appreciate care by Drs Hilarie Fredrickson and Delice Lesch.    Evlyn Clines, MD   07/26/2016, 8:02 PM

## 2016-07-27 ENCOUNTER — Encounter: Payer: Self-pay | Admitting: Oncology

## 2016-07-27 DIAGNOSIS — Z9221 Personal history of antineoplastic chemotherapy: Secondary | ICD-10-CM | POA: Insufficient documentation

## 2016-08-19 ENCOUNTER — Encounter: Payer: Self-pay | Admitting: Neurology

## 2016-08-19 ENCOUNTER — Ambulatory Visit (INDEPENDENT_AMBULATORY_CARE_PROVIDER_SITE_OTHER): Payer: Medicare Other | Admitting: Neurology

## 2016-08-19 VITALS — BP 110/62 | HR 71 | Temp 97.6°F | Ht 62.0 in | Wt 114.1 lb

## 2016-08-19 DIAGNOSIS — G40309 Generalized idiopathic epilepsy and epileptic syndromes, not intractable, without status epilepticus: Secondary | ICD-10-CM

## 2016-08-19 MED ORDER — LEVETIRACETAM ER 500 MG PO TB24
ORAL_TABLET | ORAL | 11 refills | Status: DC
Start: 2016-08-19 — End: 2017-05-18

## 2016-08-19 NOTE — Patient Instructions (Signed)
1. Continue Keppra XR 574m: Take 2 tablets at bedtime 2. Follow-up in 1 year, call for any changes  Seizure Precautions: 1. If medication has been prescribed for you to prevent seizures, take it exactly as directed.  Do not stop taking the medicine without talking to your doctor first, even if you have not had a seizure in a long time.   2. Avoid activities in which a seizure would cause danger to yourself or to others.  Don't operate dangerous machinery, swim alone, or climb in high or dangerous places, such as on ladders, roofs, or girders.  Do not drive unless your doctor says you may.  3. If you have any warning that you may have a seizure, lay down in a safe place where you can't hurt yourself.    4.  No driving for 6 months from last seizure, as per [facility]   Please refer to the following link [place] the [place] for more information: http://[email or web address].org/answerplace/Social/driving/drivingu.cfm   5.  Maintain good sleep hygiene. Avoid alcohol.  6.  Contact your doctor if you have any problems that may be related to the medicine you are taking.  7.  Call 911 and bring the patient back to the ED if:        A.  The seizure lasts longer than 5 minutes.       B.  The patient doesn't awaken shortly after the seizure  C.  The patient has new problems such as difficulty seeing, speaking or moving  D.  The patient was injured during the seizure  E.  The patient has a temperature over 102 F (39C)  F.  The patient vomited and now is having trouble breathing

## 2016-08-19 NOTE — Progress Notes (Signed)
NEUROLOGY FOLLOW UP OFFICE NOTE  [name] [id number]  HISTORY OF PRESENT ILLNESS: I had the pleasure of seeing [name] in follow-up in the neurology clinic on 08/19/2016. The patient was last seen 7 months ago due to concern that thrombocytopenia could be related to Dilantin or slightly increased spleen size. She was tapered off Dilantin and continues on Keppra XR 1035m qhs, with no seizures since 2013. She denies any side effects on Keppra. She denies any headaches, dizziness, diplopia, gait instability, focal numbness/tingling/weakness, staring/unresponsive episodes, gaps in time. Her platelet count continues to fluctuate despite being off Dilantin, she is scheduled for a bone marrow biopsy next week.   HPI: This is a pleasant 70yo RH woman with a history of Crohn's disease, uterine cancer, and breast cancer s/p lumpectomy, chemo and radiation therapy 5 years ago, who started having seizures at age 70or 89 She recalls going to the bathroom then back to bed early morning, then her next recollection was being brought to the hospital. Her husband heard her scream out followed by whole body stiffening and shaking lasting 2-3 minutes, with associated tongue bite and urinary incontinence. She was brought to the hospital and not started on medication. Six months after, she had a second convulsion out of sleep with similar screaming out and body stiffening and shaking. She was again brought to the hospital, and per patient, not started on medication. A month later, she had a third nocturnal convulsion and saw her PCP who started her on Dilantin 4068mday. She may or may not have seen a neurologist, she is unsure, but does not recall having an EEG done. She recalls being told that she had UTIs when she had the seizures. She was seizure-free for 9 years until 2013 when she forgot to take her medication and had another nocturnal convulsion where she fractured her right arm.  Dilantin dose was increased to 60063may. After a few months, she started having dizziness and difficulty ambulating. She went to the ER where he Dilantin level was supratherapeutic and she was instructed to stop the medication for a few days then restart at 200m8mD. She has been doing well since with no seizures and no further side effects. She and her husband deny any staring/unresponsive episodes, no gaps in time, olfactory/gustatory hallucinations, deja vu, rising epigastric sensation, focal numbness/tingling/weakness, myoclonic jerks. She denies any dizziness, diplopia, gait instability, gum problems. She had a DEXA scan in 11/2012 and has a diagnosis of osteoporosis. She denies any headaches, dysarthria, dysphagia, neck/back pain, bowel/bladder dysfunction. No chest pain or shortness of breath. She is driving.  Epilepsy Risk Factors: Her maternal aunt had seizures, otherwise she had a normal birth and early development. There is no history of febrile convulsions, CNS infections such as meningitis/encephalitis, significant traumatic brain injury, neurosurgical procedures.  Prior AEDs: none EEG 05/01/14: occasional independent focal slowing over the bilateral temporal regions, left greater than right. Imaging: Head CT without contrast done 07/13/2013 reported as unremarkable.  PAST MEDICAL HISTORY: Past Medical History:  Diagnosis Date  . Bowel obstruction (HCC)[place]. Crohn's disease (HCC)[place]. Epilepsy (HCC)[place]. Malignant neoplasm of breast (female), unspecified site    right  . Malignant neoplasm of corpus uteri, except isthmus (HCC)[place]. Osteoporosis, unspecified   . Personal history of colonic polyps    hyperplastic  . Pneumonia   . Renal cyst   . Seizure disorder (HCC)[place]. Thrombocytopenia (HCC)[place]  MEDICATIONS: Current Outpatient Prescriptions on File Prior to Visit  Medication Sig Dispense Refill  . aspirin EC 81 MG tablet Take 81 mg by mouth daily.    . Calcium  Carb-Cholecalciferol (CALCIUM 1000 + D PO) Take 1,000 mg by mouth 2 (two) times daily.    . Cholecalciferol (VITAMIN D) 1000 UNITS capsule Take 1,000 Units by mouth 2 (two) times daily.      . Cranberry 500 MG CAPS Take 500 mg by mouth daily.    . cyanocobalamin (,VITAMIN B-12,) 1000 MCG/ML injection Inject 1,000 mcg into the muscle every 30 (thirty) days.     Marland Kitchen levETIRAcetam (KEPPRA XR) 500 MG 24 hr tablet Take 2 tablets at bedtime 60 tablet 11  . mesalamine (PENTASA) 250 MG CR capsule Take 4 capsules (1,000 mg total) by mouth 3 (three) times daily. 360 capsule 11  . potassium chloride SA (K-DUR,KLOR-CON) 20 MEQ tablet Take 40 mEq by mouth 2 (two) times daily.     . pravastatin (PRAVACHOL) 10 MG tablet Take 5 mg by mouth daily.     No current facility-administered medications on file prior to visit.     ALLERGIES: Allergies  Allergen Reactions  . Ampicillin Diarrhea  . Penicillins Diarrhea    Has patient had a PCN reaction causing immediate rash, facial/tongue/throat swelling, SOB or lightheadedness with hypotension: No Has patient had a PCN reaction causing severe rash involving mucus membranes or skin necrosis: No Has patient had a PCN reaction that required hospitalization No Has patient had a PCN reaction occurring within the last 10 years: No If all of the above answers are "NO", then may proceed with Cephalosporin use.  . Pneumococcal Vac Polyvalent Other (See Comments)    Local swelling  . Tetanus Toxoid Swelling    Arm swelling    FAMILY HISTORY: Family History  Problem Relation Age of Onset  . Breast cancer Maternal Aunt   . Heart disease Mother   . Aneurysm Father   . Colon cancer Neg Hx     SOCIAL HISTORY: Social History   Social History  . Marital status: Married    Spouse name: N/A  . Number of children: 2  . Years of education: N/A   Occupational History  . Retired Retired   Social History Main Topics  . Smoking status: Never Smoker  . Smokeless  tobacco: Never Used  . Alcohol use No  . Drug use: No  . Sexual activity: Yes    Birth control/ protection: None   Other Topics Concern  . Not on file   Social History Narrative  . No narrative on file    REVIEW OF SYSTEMS: Constitutional: No fevers, chills, or sweats, no generalized fatigue, change in appetite Eyes: No visual changes, double vision, eye pain Ear, nose and throat: No hearing loss, ear pain, nasal congestion, sore throat Cardiovascular: No chest pain, palpitations Respiratory:  No shortness of breath at rest or with exertion, wheezes GastrointestinaI: No nausea, vomiting, diarrhea, abdominal pain, fecal incontinence Genitourinary:  No dysuria, urinary retention or frequency Musculoskeletal:  No neck pain, back pain Integumentary: No rash, pruritus, skin lesions Neurological: as above Psychiatric: No depression, insomnia, anxiety Endocrine: No palpitations, fatigue, diaphoresis, mood swings, change in appetite, change in weight, increased thirst Hematologic/Lymphatic:  No anemia, purpura, petechiae. Allergic/Immunologic: no itchy/runny eyes, nasal congestion, recent allergic reactions, rashes  PHYSICAL EXAM: Vitals:   08/19/16 1109  BP: 110/62  Pulse: 71  Temp: 97.6 F (36.4 C)   General: No acute distress  Head:  Normocephalic/atraumatic Neck: supple, no paraspinal tenderness, full range of motion Heart:  Regular rate and rhythm Lungs:  Clear to auscultation bilaterally Back: No paraspinal tenderness Skin/Extremities: No rash, no edema Neurological Exam: alert and oriented to person, place, and time. No aphasia or dysarthria. Fund of knowledge is appropriate.  Recent and remote memory are intact. 3/3 delayed recall.  Attention and concentration are normal.    Able to name objects and repeat phrases. Cranial nerves: Pupils equal, round, reactive to light.  Extraocular movements intact with no nystagmus. Visual fields full. Facial sensation intact. No facial  asymmetry. Tongue, uvula, palate midline.  Motor: Bulk and tone normal, muscle strength 5/5 throughout with no pronator drift.  Sensation to light touch intact.  No extinction to double simultaneous stimulation.  Deep tendon reflexes 2+ throughout, toes downgoing.  Finger to nose testing intact.  Gait narrow-based and steady, able to tandem walk adequately.  Romberg negative.  IMPRESSION: This is a pleasant 70 yo RH woman with a history of Crohn's disease, uterine cancer, breast cancer s/p lumpectomy, chemo, and radiation therapy, with a history of 4 convulsive seizures that occur out of sleep since age 74 or 55. Last convulsion was in 2013. Her neurological exam is normal, head CT in 2014 unremarkable, EEG had shown independent focal slowing over the bilateral temporal regions, no epileptiform discharges. There has been concern that thrombocytopenia is possibly due to Dilantin, this has been tapered off but platelet count continues to fluctuate. She has been switched to Keppra XR 1049m qhs with no seizure recurrence. Continue current dose. She is aware of [place] driving laws to stop driving after a seizure, until 6 months seizure-free. She will follow-up in 1 year and knows to call our office for any problems.  Thank you for allowing me to participate in her care.  Please do not hesitate to call for any questions or concerns.  The duration of this appointment visit was 15 minutes of face-to-face time with the patient.  Greater than 50% of this time was spent in counseling, explanation of diagnosis, planning of further management, and coordination of care.   KEllouise Calderon M.D.   CC: DEstrella Deeds NP; Dr. LMarko Plume

## 2016-08-24 ENCOUNTER — Other Ambulatory Visit: Payer: Self-pay | Admitting: General Surgery

## 2016-08-25 ENCOUNTER — Ambulatory Visit (HOSPITAL_COMMUNITY)
Admission: RE | Admit: 2016-08-25 | Discharge: 2016-08-25 | Disposition: A | Discharge status: Home / Self Care | Payer: Medicare Other | Source: Ambulatory Visit | Attending: Oncology | Admitting: Oncology

## 2016-08-25 ENCOUNTER — Encounter (HOSPITAL_COMMUNITY): Payer: Self-pay

## 2016-08-25 DIAGNOSIS — Z853 Personal history of malignant neoplasm of breast: Secondary | ICD-10-CM | POA: Diagnosis not present

## 2016-08-25 DIAGNOSIS — K5090 Crohn's disease, unspecified, without complications: Secondary | ICD-10-CM | POA: Insufficient documentation

## 2016-08-25 DIAGNOSIS — M810 Age-related osteoporosis without current pathological fracture: Secondary | ICD-10-CM | POA: Diagnosis not present

## 2016-08-25 DIAGNOSIS — Z86010 Personal history of colonic polyps: Secondary | ICD-10-CM | POA: Diagnosis not present

## 2016-08-25 DIAGNOSIS — Z880 Allergy status to penicillin: Secondary | ICD-10-CM | POA: Diagnosis not present

## 2016-08-25 DIAGNOSIS — G40909 Epilepsy, unspecified, not intractable, without status epilepticus: Secondary | ICD-10-CM | POA: Insufficient documentation

## 2016-08-25 DIAGNOSIS — N281 Cyst of kidney, acquired: Secondary | ICD-10-CM | POA: Diagnosis not present

## 2016-08-25 DIAGNOSIS — Z9221 Personal history of antineoplastic chemotherapy: Secondary | ICD-10-CM

## 2016-08-25 DIAGNOSIS — Z8542 Personal history of malignant neoplasm of other parts of uterus: Secondary | ICD-10-CM | POA: Diagnosis not present

## 2016-08-25 DIAGNOSIS — D696 Thrombocytopenia, unspecified: Secondary | ICD-10-CM | POA: Diagnosis not present

## 2016-08-25 LAB — BONE MARROW EXAM

## 2016-08-25 LAB — CBC
HEMATOCRIT: 37.3 % (ref 36.0–46.0)
HEMOGLOBIN: 12.5 g/dL (ref 12.0–15.0)
MCH: 30.6 pg (ref 26.0–34.0)
MCHC: 33.5 g/dL (ref 30.0–36.0)
MCV: 91.2 fL (ref 78.0–100.0)
Platelets: 113 10*3/uL — ABNORMAL LOW (ref 150–400)
RBC: 4.09 MIL/uL (ref 3.87–5.11)
RDW: 14.3 % (ref 11.5–15.5)
WBC: 4.5 10*3/uL (ref 4.0–10.5)

## 2016-08-25 LAB — APTT: APTT: 25 s (ref 24–36)

## 2016-08-25 LAB — PROTIME-INR
INR: 0.97
PROTHROMBIN TIME: 12.9 s (ref 11.4–15.2)

## 2016-08-25 MED ORDER — MIDAZOLAM HCL 2 MG/2ML IJ SOLN
INTRAMUSCULAR | Status: AC
  Filled 2016-08-25: qty 6

## 2016-08-25 MED ORDER — MIDAZOLAM HCL 2 MG/2ML IJ SOLN
INTRAMUSCULAR | Status: AC | PRN
  Administered 2016-08-25 (×3): 1 mg via INTRAVENOUS

## 2016-08-25 MED ORDER — SODIUM CHLORIDE 0.9 % IV SOLN: INTRAVENOUS | Status: DC

## 2016-08-25 MED ORDER — FENTANYL CITRATE (PF) 100 MCG/2ML IJ SOLN
INTRAMUSCULAR | Status: AC
  Filled 2016-08-25: qty 4

## 2016-08-25 MED ORDER — FENTANYL CITRATE (PF) 100 MCG/2ML IJ SOLN
INTRAMUSCULAR | Status: AC | PRN
  Administered 2016-08-25 (×2): 25 ug via INTRAVENOUS
  Administered 2016-08-25: 50 ug via INTRAVENOUS

## 2016-08-25 MED ORDER — SODIUM CHLORIDE 0.9 % IV SOLN
INTRAVENOUS | Status: DC
  Administered 2016-08-25: 09:00:00 via INTRAVENOUS

## 2016-08-25 NOTE — Discharge Instructions (Signed)
Bone Marrow Aspiration and Bone Marrow Biopsy °Bone marrow aspiration and bone marrow biopsy are procedures that are done to diagnose blood disorders. You may also have one of these procedures to help diagnose infections or some types of cancer. °Bone marrow is the soft tissue that is inside your bones. Blood cells are produced in bone marrow. For bone marrow aspiration, a sample of tissue in liquid form is removed from inside your bone. For a bone marrow biopsy, a small core of bone marrow tissue is removed. Then these samples are examined under a microscope or tested in a lab. °You may need these procedures if you have an abnormal complete blood count (CBC). The aspiration or biopsy sample is usually taken from the top of your hip bone. Sometimes, an aspiration sample is taken from your chest bone (sternum). °LET YOUR HEALTH CARE PROVIDER KNOW ABOUT: °· Any allergies you have. °· All medicines you are taking, including vitamins, herbs, eye drops, creams, and over-the-counter medicines. °· Previous problems you or members of your family have had with the use of anesthetics. °· Any blood disorders you have. °· Previous surgeries you have had. °· Any medical conditions you may have. °· Whether you are pregnant or you think that you may be pregnant. °RISKS AND COMPLICATIONS °Generally, this is a safe procedure. However, problems may occur, including: °· Infection. °· Bleeding. °BEFORE THE PROCEDURE °· Ask your health care provider about: °¨ Changing or stopping your regular medicines. This is especially important if you are taking diabetes medicines or blood thinners. °¨ Taking medicines such as aspirin and ibuprofen. These medicines can thin your blood. Do not take these medicines before your procedure if your health care provider instructs you not to. °· Plan to have someone take you home after the procedure. °· If you go home right after the procedure, plan to have someone with you for 24 hours. °PROCEDURE  °· An  IV tube may be inserted into one of your veins. °· The injection site will be cleaned with a germ-killing solution (antiseptic). °· You will be given one or more of the following: °¨ A medicine that helps you relax (sedative). °¨ A medicine that numbs the area (local anesthetic). °· The bone marrow sample will be removed as follows: °¨ For an aspiration, a hollow needle will be inserted through your skin and into your bone. Bone marrow fluid will be drawn up into a syringe. °¨ For a biopsy, your health care provider will use a hollow needle to remove a core of tissue from your bone marrow. °· The needle will be removed. °· A bandage (dressing) will be placed over the insertion site and taped in place. °The procedure may vary among health care providers and hospitals. °AFTER THE PROCEDURE °· Your blood pressure, heart rate, breathing rate, and blood oxygen level will be monitored often until the medicines you were given have worn off. °· Return to your normal activities as directed by your health care provider. °  °This information is not intended to replace advice given to you by your health care provider. Make sure you discuss any questions you have with your health care provider. °  °Document Released: 12/02/2004 Document Revised: 04/15/2015 Document Reviewed: 11/20/2014 °Elsevier Interactive Patient Education ©2016 Elsevier Inc. °Bone Marrow Aspiration and Bone Marrow Biopsy, Care After °Refer to this sheet in the next few weeks. These instructions provide you with information about caring for yourself after your procedure. Your health care provider may also give you   more specific instructions. Your treatment has been planned according to current medical practices, but problems sometimes occur. Call your health care provider if you have any problems or questions after your procedure. WHAT TO EXPECT AFTER THE PROCEDURE After your procedure, it is common to have:  Soreness or tenderness around the puncture  site.  Bruising. HOME CARE INSTRUCTIONS  Take medicines only as directed by your health care provider.  Follow your health care provider's instructions about:  Puncture site care.  Bandage (dressing) changes and removal.  Bathe and shower as directed by your health care provider.  Check your puncture site every day for signs of infection. Watch for:  Redness, swelling, or pain.  Fluid, blood, or pus.  Return to your normal activities as directed by your health care provider.  Keep all follow-up visits as directed by your health care provider. This is important. SEEK MEDICAL CARE IF:  You have a fever.  You have uncontrollable bleeding.  You have redness, swelling, or pain at the site of your puncture.  You have fluid, blood, or pus coming from your puncture site.   This information is not intended to replace advice given to you by your health care provider. Make sure you discuss any questions you have with your health care provider.   Document Released: 06/18/2005 Document Revised: 04/15/2015 Document Reviewed: 11/20/2014 Elsevier Interactive Patient Education 2016 Elsevier Inc. Moderate Conscious Sedation, Adult Sedation is the use of medicines to promote relaxation and relieve discomfort and anxiety. Moderate conscious sedation is a type of sedation. Under moderate conscious sedation you are less alert than normal but are still able to respond to instructions or stimulation. Moderate conscious sedation is used during short medical and dental procedures. It is milder than deep sedation or general anesthesia and allows you to return to your regular activities sooner. LET [facility] CARE PROVIDER KNOW ABOUT:   Any allergies you have.  All medicines you are taking, including vitamins, herbs, eye drops, creams, and over-the-counter medicines.  Use of steroids (by mouth or creams).  Previous problems you or members of your family have had with the use of  anesthetics.  Any blood disorders you have.  Previous surgeries you have had.  Medical conditions you have.  Possibility of pregnancy, if this applies.  Use of cigarettes, alcohol, or illegal drugs. RISKS AND COMPLICATIONS Generally, this is a safe procedure. However, as with any procedure, problems can occur. Possible problems include:  Oversedation.  Trouble breathing on your own. You may need to have a breathing tube until you are awake and breathing on your own.  Allergic reaction to any of the medicines used for the procedure. BEFORE THE PROCEDURE  You may have blood tests done. These tests can help show how well your kidneys and liver are working. They can also show how well your blood clots.  A physical exam will be done.  Only take medicines as directed by your health care provider. You may need to stop taking medicines (such as blood thinners, aspirin, or nonsteroidal anti-inflammatory drugs) before the procedure.   Do not eat or drink at least 6 hours before the procedure or as directed by your health care provider.  Arrange for a responsible adult, family member, or friend to take you home after the procedure. He or she should stay with you for at least 24 hours after the procedure, until the medicine has worn off. PROCEDURE   An intravenous (IV) catheter will be inserted into one of your  veins. Medicine will be able to flow directly into your body through this catheter. You may be given medicine through this tube to help prevent pain and help you relax.  The medical or dental procedure will be done. AFTER THE PROCEDURE  You will stay in a recovery area until the medicine has worn off. Your blood pressure and pulse will be checked.   Depending on the procedure you had, you may be allowed to go home when you can tolerate liquids and your pain is under control.   This information is not intended to replace advice given to you by your health care provider. Make  sure you discuss any questions you have with your health care provider.   Document Released: 08/24/2001 Document Revised: 12/20/2014 Document Reviewed: 08/06/2013 Elsevier Interactive Patient Education [employer].

## 2016-08-25 NOTE — Consult Note (Signed)
Chief Complaint: Patient was seen in consultation today for CT-guided bone marrow biopsy  Referring Physician(s): [place]  Supervising Physician: Jacqulynn Cadet  Patient Status: Outpatient  History of Present Illness: [name] is a 70 y.o. female with remote history of right breast cancer, endometrial cancer and mild thrombocytopenia of unknown etiology which has been present since at least 2011. She presents today for CT-guided bone marrow biopsy for further evaluation.  Past Medical History:  Diagnosis Date  . Bowel obstruction ([place])   . Crohn's disease ([place])   . Epilepsy ([place])   . Malignant neoplasm of breast (female), unspecified site    right  . Malignant neoplasm of corpus uteri, except isthmus ([place])   . Osteoporosis, unspecified   . Personal history of colonic polyps    hyperplastic  . Pneumonia   . Renal cyst   . Seizure disorder ([place])   . Thrombocytopenia ([place])     Past Surgical History:  Procedure Laterality Date  . ABDOMINAL HYSTERECTOMY    . BREAST LUMPECTOMY    . CHOLECYSTECTOMY    . COLON RESECTION    . COLONOSCOPY WITH PROPOFOL N/A 10/15/2015   Procedure: COLONOSCOPY WITH PROPOFOL;  Surgeon: Jerene Bears, MD;  Location: [facility] ENDOSCOPY;  Service: Gastroenterology;  Laterality: N/A;  . ELBOW SURGERY     left    Allergies: Ampicillin; Penicillins; Pneumococcal vac polyvalent; and Tetanus toxoid  Medications: Prior to Admission medications   Medication Sig Start Date End Date Taking? Authorizing Provider  aspirin EC 81 MG tablet Take 81 mg by mouth daily.    Historical Provider, MD  Calcium Carb-Cholecalciferol (CALCIUM 1000 + D PO) Take 1,000 mg by mouth 2 (two) times daily.    Historical Provider, MD  Cholecalciferol (VITAMIN D) 1000 UNITS capsule Take 1,000 Units by mouth 2 (two) times daily.      Historical Provider, MD  Cranberry 500 MG CAPS Take 500 mg by mouth daily.    Historical Provider, MD  cyanocobalamin (,VITAMIN B-12,)  1000 MCG/ML injection Inject 1,000 mcg into the muscle every 30 (thirty) days.     Historical Provider, MD  levETIRAcetam (KEPPRA XR) 500 MG 24 hr tablet Take 2 tablets at bedtime 08/19/16   Cameron Sprang, MD  mesalamine (PENTASA) 250 MG CR capsule Take 4 capsules (1,000 mg total) by mouth 3 (three) times daily. 06/28/16   Jerene Bears, MD  potassium chloride SA (K-DUR,KLOR-CON) 20 MEQ tablet Take 40 mEq by mouth 2 (two) times daily.     Historical Provider, MD  pravastatin (PRAVACHOL) 10 MG tablet Take 5 mg by mouth daily.    Historical Provider, MD     Family History  Problem Relation Age of Onset  . Heart disease Mother   . Aneurysm Father   . Breast cancer Maternal Aunt   . Colon cancer Neg Hx     Social History   Social History  . Marital status: Married    Spouse name: N/A  . Number of children: 2  . Years of education: N/A   Occupational History  . Retired Retired   Social History Main Topics  . Smoking status: Never Smoker  . Smokeless tobacco: Never Used  . Alcohol use No  . Drug use: No  . Sexual activity: Yes    Birth control/ protection: None   Other Topics Concern  . Not on file   Social History Narrative  . No narrative on file      Review of  Systems currently denies fever, headache, chest pain, dyspnea, cough, abdominal/back pain, nausea, vomiting or abnormal bleeding.  Vital Signs: Blood pressure 135/66, heart rate 79, temp 97.6, respirations 18, O2 sat 99% room air   Physical Exam patient awake, alert. Chest clear to auscultation bilaterally. Heart with regular rate and rhythm. Abdomen soft, positive bowel sounds, nontender. Lower extremities with no edema  Mallampati Score:     Imaging: No results found.  Labs:  CBC:  Recent Labs  01/19/16 1250 04/12/16 1228 07/26/16 1001 08/25/16 0918  WBC 3.7* 3.2* 3.7* 4.5  HGB 11.2* 10.9* 12.1 12.5  HCT 33.9* 31.5* 37.4 37.3  PLT 108* 120* 97* PENDING    COAGS:  Recent Labs   08/25/16 0918  INR 0.97  APTT 25    BMP:  Recent Labs  09/01/15 0507 09/02/15 0545 09/03/15 0400 09/04/15 0427 07/26/16 1001  NA 133* 136 139 138 137  K 3.8 4.2 3.3* 2.6* 3.7  CL 102 107 113* 112*  --   CO2 23 15* 20* 20* 22  GLUCOSE 95 76 89 99 138  BUN 10 13 8  <5* 8.3  CALCIUM 7.5* 7.2* 7.2* 7.2* 9.2  CREATININE 1.08* 1.20* 0.91 0.82 1.1  GFRNONAA 50* 44* >60 >60  --   GFRAA 58* 51* >60 >60  --     LIVER FUNCTION TESTS:  Recent Labs  09/01/15 0507 09/04/15 0427 07/26/16 1001  BILITOT 0.9 0.3 0.60  AST 19 20 25   ALT 22 16 27   ALKPHOS 114 83 77  PROT 5.1* 4.8* 7.1  ALBUMIN 2.6* 2.5* 3.7    TUMOR MARKERS: No results for input(s): AFPTM, CEA, CA199, CHROMGRNA in the last 8760 hours.  Assessment and Plan: 70 y.o. female with remote history of right breast cancer, endometrial cancer and mild thrombocytopenia of unknown etiology which has been present since at least 2011. She presents today for CT-guided bone marrow biopsy for further evaluation.Risks and benefits discussed with the patient/spouse including, but not limited to bleeding, infection, damage to adjacent structures or low yield requiring additional tests. All of the patient's questions were answered, patient is agreeable to proceed.Consent signed and in chart.     Thank you for this interesting consult.  I greatly enjoyed meeting ALETA MANTERNACH and look forward to participating in their care.  A copy of this report was sent to the requesting provider on this date.  Electronically Signed: D. Rowe Robert 08/25/2016, 9:44 AM   I spent a total of  25 minutes   in face to face in clinical consultation, greater than 50% of which was counseling/coordinating care for CT-guided bone marrow biopsy

## 2016-08-26 ENCOUNTER — Ambulatory Visit (HOSPITAL_COMMUNITY): Payer: Medicare Other

## 2016-08-26 ENCOUNTER — Other Ambulatory Visit (HOSPITAL_COMMUNITY): Payer: Medicare Other

## 2016-09-06 LAB — CHROMOSOME ANALYSIS, BONE MARROW

## 2016-09-08 ENCOUNTER — Other Ambulatory Visit: Payer: Self-pay | Admitting: Oncology

## 2016-09-13 ENCOUNTER — Other Ambulatory Visit: Payer: Self-pay | Admitting: Oncology

## 2016-09-13 ENCOUNTER — Other Ambulatory Visit: Payer: Medicare Other

## 2016-09-13 ENCOUNTER — Encounter: Payer: Self-pay | Admitting: Oncology

## 2016-09-13 ENCOUNTER — Ambulatory Visit: Payer: Medicare Other | Admitting: Oncology

## 2016-09-13 DIAGNOSIS — D696 Thrombocytopenia, unspecified: Secondary | ICD-10-CM

## 2016-09-13 NOTE — Progress Notes (Signed)
Medical Oncology  Bone marrow cytogenetics resulted 09-03-16 and received now from [place]: Normal.  Report to be scanned into EMR  L.Marko Plume, MD

## 2016-09-16 ENCOUNTER — Encounter (HOSPITAL_COMMUNITY): Payer: Self-pay

## 2016-09-19 ENCOUNTER — Other Ambulatory Visit: Payer: Self-pay | Admitting: Oncology

## 2016-09-20 ENCOUNTER — Ambulatory Visit (HOSPITAL_BASED_OUTPATIENT_CLINIC_OR_DEPARTMENT_OTHER): Payer: Medicare Other | Admitting: Oncology

## 2016-09-20 ENCOUNTER — Other Ambulatory Visit (HOSPITAL_BASED_OUTPATIENT_CLINIC_OR_DEPARTMENT_OTHER): Payer: Medicare Other

## 2016-09-20 VITALS — BP 138/75 | HR 73 | Temp 97.5°F | Resp 18 | Ht 62.0 in | Wt 115.1 lb

## 2016-09-20 DIAGNOSIS — D696 Thrombocytopenia, unspecified: Secondary | ICD-10-CM

## 2016-09-20 DIAGNOSIS — Z23 Encounter for immunization: Secondary | ICD-10-CM | POA: Diagnosis not present

## 2016-09-20 DIAGNOSIS — Z9221 Personal history of antineoplastic chemotherapy: Secondary | ICD-10-CM | POA: Diagnosis not present

## 2016-09-20 DIAGNOSIS — Z8542 Personal history of malignant neoplasm of other parts of uterus: Secondary | ICD-10-CM

## 2016-09-20 DIAGNOSIS — Z853 Personal history of malignant neoplasm of breast: Secondary | ICD-10-CM | POA: Diagnosis not present

## 2016-09-20 DIAGNOSIS — R922 Inconclusive mammogram: Secondary | ICD-10-CM

## 2016-09-20 DIAGNOSIS — D693 Immune thrombocytopenic purpura: Secondary | ICD-10-CM

## 2016-09-20 DIAGNOSIS — Z79899 Other long term (current) drug therapy: Secondary | ICD-10-CM

## 2016-09-20 LAB — CBC WITH DIFFERENTIAL/PLATELET
BASO%: 0.3 % (ref 0.0–2.0)
Basophils Absolute: 0 10*3/uL (ref 0.0–0.1)
EOS%: 0.8 % (ref 0.0–7.0)
Eosinophils Absolute: 0 10*3/uL (ref 0.0–0.5)
HCT: 34.6 % — ABNORMAL LOW (ref 34.8–46.6)
HEMOGLOBIN: 11.7 g/dL (ref 11.6–15.9)
LYMPH%: 32.9 % (ref 14.0–49.7)
MCH: 30.4 pg (ref 25.1–34.0)
MCHC: 33.8 g/dL (ref 31.5–36.0)
MCV: 89.9 fL (ref 79.5–101.0)
MONO#: 0.1 10*3/uL (ref 0.1–0.9)
MONO%: 3.3 % (ref 0.0–14.0)
NEUT%: 62.7 % (ref 38.4–76.8)
NEUTROS ABS: 2.3 10*3/uL (ref 1.5–6.5)
Platelets: 95 10*3/uL — ABNORMAL LOW (ref 145–400)
RBC: 3.85 10*6/uL (ref 3.70–5.45)
RDW: 14.5 % (ref 11.2–14.5)
WBC: 3.6 10*3/uL — AB (ref 3.9–10.3)
lymph#: 1.2 10*3/uL (ref 0.9–3.3)

## 2016-09-20 MED ORDER — INFLUENZA VAC SPLIT QUAD 0.5 ML IM SUSY
0.5000 mL | PREFILLED_SYRINGE | Freq: Once | INTRAMUSCULAR | Status: AC
  Administered 2016-09-20: 0.5 mL via INTRAMUSCULAR
  Filled 2016-09-20: qty 0.5

## 2016-09-20 NOTE — Progress Notes (Signed)
OFFICE PROGRESS NOTE   September 20, 2016   Physicians: Estrella Deeds, NP with [facility]; Zenovia Jarred, Ellouise Newer, J.Kerr Cindie Laroche)  INTERVAL HISTORY:  Patient is seen, together with husband, in follow up of information from bone marrow exam and cytogenetics done 08-25-16 due to mildly progressive thrombocytopenia, with history of chemotherapy for breast cancer 1990. There is no evidence of MDS or other bone marrow pathology, with increased normal megakaryocytes suggesting ITP. I have known her since 45.  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 She has Crohn's disease and seizure disorder, on medications including change in past few months to keppra from long term dilantin. Mild thrombocytopenia has been present intermittently since at least 2011, did not obviously improve with steroids used for Crohn's 08-2015. She has had no bleeding.  Patient has felt well since she was here last. She had slight soreness at bone marrow site, no significant bleeding, resolved quickly. She has had no recent bowel problems, continuing Pentasa by Dr Hilarie Fredrickson. She denies respiratory symptoms, unusual bruising, fever or symptoms of infection. She has no breast concerns, up to date on mammograms. She denies pelvic discomfort or LE swelling. Energy is generally good and appetite is better. Remainder of 10 point Review of Systems negative.  ONCOLOGIC HISTORY  T1N0 right breast cancer in 1990, on observation since completing adjuvant CMF chemotherapy and 5 years of tamoxifen. Mammograms done at [facility] 01-21-2016, report scanned in this EMR.  She also has history of IB grade 1 endometrial cancer treated with TAH, BSO, pelvic lymphadnectomy by Dr Josephina Shih  11-29-2006.  Mild thrombocytopenia present intermittently at least back to 2011.  She had CMF chemotherapy in  1990. Abdominal US done in [facility] 12-2015 "spleen 9.9 cm, negative" and no mention of adenopathy. Platelets did not seem to increase when she was on steroids for Crohn's flare 08-2015 and did not improve with change from long term dilantin to keppra. Bone marrow exam and cytogenetics 08-25-16 normal trilineage hematopoiesis with increase in normal megakaryocytes and normal cytogenetics, suggesting ITP.  Objective:  Vital signs in last 24 hours:  BP 138/75 (BP Location: Left Arm, Patient Position: Sitting)   Pulse 73   Temp 97.5 F (36.4 C) (Oral)   Resp 18   Ht 5' 2"  (1.575 m)   Wt 115 lb 1.6 oz (52.2 kg)   SpO2 100%   BMI 21.05 kg/m  Weight up 3 lbs Alert, oriented and appropriate, very pleasant as always. Ambulatory without difficulty. Respirations not labored, looks comfortable  HEENT:PERRL, sclerae not icteric. Oral mucosa moist without lesions, posterior pharynx clear. Some broken teeth but clean dentition Neck supple. No JVD.  Lymphatics:no peripheral adenopathy Resp: clear to auscultation bilaterally and normal percussion bilaterally Cardio: regular rate and rhythm. No gallop. GI: soft, nontender, not distended, no mass or organomegaly. Normally active bowel sounds. Surgical incision not remarkable. Musculoskeletal/ Extremities: UE/ LE without pitting edema, cords, tenderness Neuro: no peripheral neuropathy. Otherwise nonfocal Skin without rash, ecchymosis, petechiae. Site of bone marrow exam right posterior iliac crest not remarkable   Lab Results:  Results for orders placed or performed in visit on 09/20/16  CBC with Differential  Result Value Ref Range   WBC 3.6 (L) 3.9 - 10.3 10e3/uL   NEUT# 2.3 1.5 - 6.5 10e3/uL   HGB 11.7 11.6 - 15.9 g/dL   HCT 34.6 (L) 34.8 - 46.6 %  Platelets 95 (L) 145 - 400 10e3/uL   MCV 89.9 79.5 - 101.0 fL   MCH 30.4 25.1 - 34.0 pg   MCHC 33.8 31.5 - 36.0 g/dL   RBC 3.85 3.70 - 5.45 10e6/uL   RDW 14.5 11.2 - 14.5 %   lymph# 1.2 0.9 -  3.3 10e3/uL   MONO# 0.1 0.1 - 0.9 10e3/uL   Eosinophils Absolute 0.0 0.0 - 0.5 10e3/uL   Basophils Absolute 0.0 0.0 - 0.1 10e3/uL   NEUT% 62.7 38.4 - 76.8 %   LYMPH% 32.9 14.0 - 49.7 %   MONO% 3.3 0.0 - 14.0 %   EOS% 0.8 0.0 - 7.0 %   BASO% 0.3 0.0 - 2.0 %     Studies/Results: [name] [facility] Collected: 08/25/2016 Client: [facility] Accession: [id number] Received: 08/25/2016 Rowe Robert, PA-CBONE MARROW REPORT FINAL DIAGNOSIS Diagnosis Bone Marrow, Aspirate,Biopsy, and Clot, right iliac crest - VARIABLY CELLULAR BONE MARROW WITH TRILINEAGE HEMATOPOIESIS. - SEE COMMENT. PERIPHERAL BLOOD: - THROMBOCYTOPENIA Diagnosis Note The bone marrow is variably cellular but with trilineage hematopoiesis including abundant megakaryocytes in the more cellular areas with predominantly normal morphology. Significant dyspoiesis is not present. In the presence of thrombocytopenia, I suspect that the changes are related to peripheral destruction, sequestration or consumption of platelets. Nonetheless, clinical and cytogenetic correlation is recommended.  Bone marrow findings discussed directly with Dr Gari Crown at time of report.   Cytogenetics report from [facility] specimen 08-25-16: normal female chromosomes with no observable clonal chromosomal abnormalities (scanned into this EMR)     Medications: I have reviewed the patient's current medications. Flu vaccine given today  DISCUSSION Reviewed information from bone marrow studies, with discussion of ITP. We are relieved that there is no indication of a problem related to previous chemotherapy. She is having no bleeding or other findings of concern, and likely circulating platelets are younger and very functional. She should be aware that platelets may drop if viral illness, and should have CBC checked if bleeding etc. She prefers to continue follow up at [facility], at least to establish with another hematology oncology  physician when I am no longer at this office; I have requested follow up appointment in 6-9 months with Dr Irene Limbo. She will have mammograms in 01-2017 at [facility], to be ordered by PCP. 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 No indication for treatment at present. Follow up in 6-9 months with Dr Irene Limbo.   2.history of T1N0 right breast cancer at age 63 in 88: treated with lumpectomy, CMF, radiation and 5 years tamoxifen. No known disease since then. Yearly mammograms done at [facility] 01-21-16 (see scanned report and documentation note 05-06-16). Heterogeneously dense breast tissue noted.  3.Stage 1 grade 1 endometrial cancer treated surgically 2007, no adjuvant therapy required. Note previous tamoxifen history, which may have been related. NED. No longer followed by gyn oncology 4.Crohn's disease with flare 08-2015 : completed prednisone slow taper over ~ 8 weeks. Doing well now on mesalamine. Note platelets did not increase on steroids. Dr Hilarie Fredrickson follows 5.seizure disorder on dilantin x ~ 15 years,, weaned off and DCd ~ early June 2017, now on keppra. Dr Delice Lesch following 6.low bone density by previous DEXA and comment on imaging during recent hospitalization. May have had more recent DEXA by PCP, needs yearly per  Dr Delice Lesch 7. flu shot 09-20-16   All questions answered. Time spent 25 min including >50% counseling and coordination of care. Route PCP, cc Dr Irene Limbo, update Drs Hilarie Fredrickson and Shireen Quan, MD   09/20/2016, 3:12 PM

## 2016-09-21 ENCOUNTER — Encounter: Payer: Self-pay | Admitting: Oncology

## 2016-09-21 ENCOUNTER — Other Ambulatory Visit: Payer: Self-pay | Admitting: Oncology

## 2016-09-21 DIAGNOSIS — R922 Inconclusive mammogram: Secondary | ICD-10-CM | POA: Insufficient documentation

## 2016-09-21 DIAGNOSIS — D693 Immune thrombocytopenic purpura: Secondary | ICD-10-CM | POA: Insufficient documentation

## 2016-09-23 ENCOUNTER — Encounter (HOSPITAL_COMMUNITY): Payer: Self-pay

## 2016-11-18 ENCOUNTER — Ambulatory Visit (HOSPITAL_COMMUNITY)
Admission: RE | Admit: 2016-11-18 | Discharge: 2016-11-18 | Disposition: A | Discharge status: Home / Self Care | Payer: Medicare Other | Source: Ambulatory Visit | Attending: Internal Medicine | Admitting: Internal Medicine

## 2016-11-18 ENCOUNTER — Encounter (HOSPITAL_COMMUNITY): Payer: Self-pay

## 2016-11-18 DIAGNOSIS — M810 Age-related osteoporosis without current pathological fracture: Secondary | ICD-10-CM | POA: Diagnosis present

## 2016-11-18 MED ORDER — ZOLEDRONIC ACID 5 MG/100ML IV SOLN
5.0000 mg | Freq: Once | INTRAVENOUS | Status: AC
  Administered 2016-11-18: 5 mg via INTRAVENOUS
  Filled 2016-11-18: qty 100

## 2016-11-18 MED ORDER — SODIUM CHLORIDE 0.9 % IV SOLN
Freq: Once | INTRAVENOUS | Status: AC
  Administered 2016-11-18: 11:00:00 via INTRAVENOUS

## 2016-11-18 NOTE — Discharge Instructions (Signed)
Zoledronic Acid injection (Paget's Disease, Osteoporosis) / Reclast What is this medicine? ZOLEDRONIC ACID (ZOE le dron ik AS id) lowers the amount of calcium loss from bone. It is used to treat Paget's disease and osteoporosis in women. COMMON BRAND NAME(S): Reclast, Zometa What should I tell my health care provider before I take this medicine? They need to know if you have any of these conditions: -aspirin-sensitive asthma -cancer, especially if you are receiving medicines used to treat cancer -dental disease or wear dentures -infection -kidney disease -low levels of calcium in the blood -past surgery on the parathyroid gland or intestines -receiving corticosteroids like dexamethasone or prednisone -an unusual or allergic reaction to zoledronic acid, other medicines, foods, dyes, or preservatives -pregnant or trying to get pregnant -breast-feeding How should I use this medicine? This medicine is for infusion into a vein. It is given by a health care professional in a hospital or clinic setting. Talk to your pediatrician regarding the use of this medicine in children. This medicine is not approved for use in children. What if I miss a dose? It is important not to miss your dose. Call your doctor or health care professional if you are unable to keep an appointment. What may interact with this medicine? -certain antibiotics given by injection -NSAIDs, medicines for pain and inflammation, like ibuprofen or naproxen -some diuretics like bumetanide, furosemide -teriparatide What should I watch for while using this medicine? Visit your doctor or health care professional for regular checkups. It may be some time before you see the benefit from this medicine. Do not stop taking your medicine unless your doctor tells you to. Your doctor may order blood tests or other tests to see how you are doing. Women should inform their doctor if they wish to become pregnant or think they might be pregnant.  There is a potential for serious side effects to an unborn child. Talk to your health care professional or pharmacist for more information. You should make sure that you get enough calcium and vitamin D while you are taking this medicine. Discuss the foods you eat and the vitamins you take with your health care professional. Some people who take this medicine have severe bone, joint, and/or muscle pain. This medicine may also increase your risk for jaw problems or a broken thigh bone. Tell your doctor right away if you have severe pain in your jaw, bones, joints, or muscles. Tell your doctor if you have any pain that does not go away or that gets worse. Tell your dentist and dental surgeon that you are taking this medicine. You should not have major dental surgery while on this medicine. See your dentist to have a dental exam and fix any dental problems before starting this medicine. Take good care of your teeth while on this medicine. Make sure you see your dentist for regular follow-up appointments. What side effects may I notice from receiving this medicine? Side effects that you should report to your doctor or health care professional as soon as possible: -allergic reactions like skin rash, itching or hives, swelling of the face, lips, or tongue -anxiety, confusion, or depression -breathing problems -changes in vision -eye pain -feeling faint or lightheaded, falls -jaw pain, especially after dental work -mouth sores -muscle cramps, stiffness, or weakness -redness, blistering, peeling or loosening of the skin, including inside the mouth -trouble passing urine or change in the amount of urine Side effects that usually do not require medical attention (report to your doctor or health care  professional if they continue or are bothersome): -bone, joint, or muscle pain -constipation -diarrhea -fever -hair loss -irritation at site where injected -loss of appetite -nausea, vomiting -stomach  upset -trouble sleeping -trouble swallowing -weak or tired Where should I keep my medicine? This drug is given in a hospital or clinic and will not be stored at home.  2017 Elsevier/Gold Standard (2014-04-27 14:19:57)

## 2016-11-19 ENCOUNTER — Encounter: Payer: Self-pay | Admitting: *Deleted

## 2016-12-27 ENCOUNTER — Ambulatory Visit (INDEPENDENT_AMBULATORY_CARE_PROVIDER_SITE_OTHER): Payer: Medicare Other | Admitting: Internal Medicine

## 2016-12-27 ENCOUNTER — Encounter: Payer: Self-pay | Admitting: Internal Medicine

## 2016-12-27 VITALS — BP 110/68 | HR 80 | Ht 61.0 in | Wt 120.1 lb

## 2016-12-27 DIAGNOSIS — K5080 Crohn's disease of both small and large intestine without complications: Secondary | ICD-10-CM | POA: Diagnosis not present

## 2016-12-27 MED ORDER — MESALAMINE ER 250 MG PO CPCR: 1000.0000 mg | ORAL_CAPSULE | Freq: Three times a day (TID) | ORAL | 2 refills | Status: DC

## 2016-12-27 NOTE — Patient Instructions (Signed)
We have sent the following medications to your pharmacy for you to pick up at your convenience: Pentasa 1000 mg three times daily  Please follow up with Dr Hilarie Fredrickson in 6 months.  If you are age 73 or older, your body mass index should be between 23-30. Your Body mass index is 22.7 kg/m. If this is out of the aforementioned range listed, please consider follow up with your Primary Care Provider.  If you are age 68 or younger, your body mass index should be between 19-25. Your Body mass index is 22.7 kg/m. If this is out of the aformentioned range listed, please consider follow up with your Primary Care Provider.

## 2016-12-27 NOTE — Progress Notes (Signed)
Subjective:    Patient ID: [name], female    DOB: 06/22/1946, 71 y.o.   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 She was last seen in July 2017. Recently she reports she has been doing really well from a Crohn's colitis standpoint. She's had no abdominal pain. Her stools have been more formed and nonbloody. Appetite has been great and she has gained some weight. She is having 1-3 formed nonbloody stools per day. She denies melena. Denies nausea, vomiting and trouble swallowing. Good energy levels. She is continue Pentasa 1 g 3 times a day though admits to occasionally missing the middle of the day dose.  She had a bone marrow biopsy in September 2017 for evaluation of pancytopenia and this ended up being normal. She was very relieved.  Colonoscopy was performed on 10/15/2015. This showed mild erythema and luminal narrowing at the ileocolonic anastomosis which was biopsied. There was slight edema and scattered erythema without definite evidence of active colonic Crohn's disease throughout the colon. Surveillance biopsies were performed a small sessile polyp was removed from the transverse colon. Biopsies of the ileum showed chronic inactive colitis consistent with IBD. Right colon biopsies showed benign colon mucosa negative for dysplasia. The polyp was benign colonic mucosa without adenoma. The left colon biopsy showed chronic inactive colitis consistent with IBD without dysplasia.  Review of Systems As per history of present illness, otherwise negative  Current Medications, Allergies, Past Medical History, Past Surgical History, Family History and Social History were reviewed in [employer] record.     Objective:   Physical Exam BP 110/68 (BP Location: Left Arm, Patient Position: Sitting, Cuff Size: Normal)   Pulse 80   Ht 5' 1"  (1.549 m)   Wt 120 lb 2 oz (54.5 kg)   BMI 22.70  kg/m  Constitutional: Well-developed and well-nourished. No distress. HEENT: Normocephalic and atraumatic. Oropharynx is clear and moist. No oropharyngeal exudate. Conjunctivae are normal.  No scleral icterus. Neck: Neck supple. Trachea midline. Cardiovascular: Normal rate, regular rhythm and intact distal pulses.  Pulmonary/chest: Effort normal and breath sounds normal. No wheezing, rales or rhonchi. Abdominal: Soft, nontender, nondistended. Bowel sounds active throughout. There are no masses palpable. No hepatosplenomegaly. Extremities: no clubbing, cyanosis, or edema Lymphadenopathy: No cervical adenopathy noted. Neurological: Alert and oriented to person place and time. Skin: Skin is warm and dry. No rashes noted. Psychiatric: Normal mood and affect. Behavior is normal.  CBC    Component Value Date/Time   WBC 3.6 (L) 09/20/2016 0945   WBC 4.5 08/25/2016 0918   RBC 3.85 09/20/2016 0945   RBC 4.09 08/25/2016 0918   HGB 11.7 09/20/2016 0945   HCT 34.6 (L) 09/20/2016 0945   PLT 95 (L) 09/20/2016 0945   MCV 89.9 09/20/2016 0945   MCH 30.4 09/20/2016 0945   MCH 30.6 08/25/2016 0918   MCHC 33.8 09/20/2016 0945   MCHC 33.5 08/25/2016 0918   RDW 14.5 09/20/2016 0945   LYMPHSABS 1.2 09/20/2016 0945   MONOABS 0.1 09/20/2016 0945   EOSABS 0.0 09/20/2016 0945   BASOSABS 0.0 09/20/2016 0945   CMP     Component Value Date/Time   NA 137 07/26/2016 1001   K 3.7 07/26/2016 1001   CL 112 (H) 09/04/2015 0427   CO2 22 07/26/2016 1001   GLUCOSE 138 07/26/2016 1001   BUN 8.3 07/26/2016 1001   CREATININE 1.1 07/26/2016 1001   CALCIUM 9.2  07/26/2016 1001   PROT 7.1 07/26/2016 1001   ALBUMIN 3.7 07/26/2016 1001   AST 25 07/26/2016 1001   ALT 27 07/26/2016 1001   ALKPHOS 77 07/26/2016 1001   BILITOT 0.60 07/26/2016 1001   GFRNONAA >60 09/04/2015 0427   GFRAA >60 09/04/2015 0427        Assessment & Plan:  72 year old female with ileocolonic Crohn's disease who is here for  follow-up.   1. Ileocolonic Crohn's disease -- she has been able to maintain remission and has been doing well. We will continue Pentasa 1 g 3 times a day. She has not needed steroids in nearly a year. I asked that she call me should she have a flare of her symptoms prior to follow-up. Six-month follow-up. She is up-to-date with vaccinations She is due colonoscopy in November 2019  15 minutes spent with the patient today. Greater than 50% was spent in counseling and coordination of care with the patient

## 2017-03-18 ENCOUNTER — Other Ambulatory Visit: Payer: Self-pay | Admitting: *Deleted

## 2017-03-18 DIAGNOSIS — C541 Malignant neoplasm of endometrium: Secondary | ICD-10-CM

## 2017-03-21 ENCOUNTER — Telehealth: Payer: Self-pay | Admitting: Hematology

## 2017-03-21 ENCOUNTER — Ambulatory Visit (HOSPITAL_BASED_OUTPATIENT_CLINIC_OR_DEPARTMENT_OTHER): Payer: Medicare Other | Admitting: Hematology

## 2017-03-21 ENCOUNTER — Other Ambulatory Visit (HOSPITAL_BASED_OUTPATIENT_CLINIC_OR_DEPARTMENT_OTHER): Payer: Medicare Other

## 2017-03-21 VITALS — BP 137/74 | HR 76 | Temp 97.7°F | Resp 18 | Wt 123.3 lb

## 2017-03-21 DIAGNOSIS — Z8542 Personal history of malignant neoplasm of other parts of uterus: Secondary | ICD-10-CM | POA: Diagnosis not present

## 2017-03-21 DIAGNOSIS — D696 Thrombocytopenia, unspecified: Secondary | ICD-10-CM

## 2017-03-21 DIAGNOSIS — Z853 Personal history of malignant neoplasm of breast: Secondary | ICD-10-CM | POA: Diagnosis not present

## 2017-03-21 DIAGNOSIS — D693 Immune thrombocytopenic purpura: Secondary | ICD-10-CM

## 2017-03-21 DIAGNOSIS — Z9221 Personal history of antineoplastic chemotherapy: Secondary | ICD-10-CM

## 2017-03-21 DIAGNOSIS — C541 Malignant neoplasm of endometrium: Secondary | ICD-10-CM

## 2017-03-21 LAB — COMPREHENSIVE METABOLIC PANEL
ALBUMIN: 3.9 g/dL (ref 3.5–5.0)
ALK PHOS: 64 U/L (ref 40–150)
ALT: 24 U/L (ref 0–55)
ANION GAP: 10 meq/L (ref 3–11)
AST: 25 U/L (ref 5–34)
BILIRUBIN TOTAL: 0.65 mg/dL (ref 0.20–1.20)
BUN: 8.6 mg/dL (ref 7.0–26.0)
CO2: 19 meq/L — AB (ref 22–29)
Calcium: 9.4 mg/dL (ref 8.4–10.4)
Chloride: 110 mEq/L — ABNORMAL HIGH (ref 98–109)
Creatinine: 1.1 mg/dL (ref 0.6–1.1)
EGFR: 52 mL/min/{1.73_m2} — AB (ref >90)
GLUCOSE: 88 mg/dL (ref 70–140)
POTASSIUM: 4.1 meq/L (ref 3.5–5.1)
SODIUM: 139 meq/L (ref 136–145)
TOTAL PROTEIN: 7 g/dL (ref 6.4–8.3)

## 2017-03-21 LAB — CBC WITH DIFFERENTIAL/PLATELET
BASO%: 0.4 % (ref 0.0–2.0)
BASOS ABS: 0 10*3/uL (ref 0.0–0.1)
EOS%: 1.3 % (ref 0.0–7.0)
Eosinophils Absolute: 0 10*3/uL (ref 0.0–0.5)
HCT: 37.7 % (ref 34.8–46.6)
HEMOGLOBIN: 12.5 g/dL (ref 11.6–15.9)
LYMPH%: 33.6 % (ref 14.0–49.7)
MCH: 31.1 pg (ref 25.1–34.0)
MCHC: 33.1 g/dL (ref 31.5–36.0)
MCV: 93.8 fL (ref 79.5–101.0)
MONO#: 0.2 10*3/uL (ref 0.1–0.9)
MONO%: 5.2 % (ref 0.0–14.0)
NEUT#: 2.2 10*3/uL (ref 1.5–6.5)
NEUT%: 59.5 % (ref 38.4–76.8)
Platelets: 106 10*3/uL — ABNORMAL LOW (ref 145–400)
RBC: 4.02 10*6/uL (ref 3.70–5.45)
RDW: 15.2 % — AB (ref 11.2–14.5)
WBC: 3.8 10*3/uL — AB (ref 3.9–10.3)
lymph#: 1.3 10*3/uL (ref 0.9–3.3)

## 2017-03-21 NOTE — Patient Instructions (Signed)
Thank you for choosing [facility] to provide your oncology and hematology care.  To afford each patient quality time with our providers, please arrive 30 minutes before your scheduled appointment time.  If you arrive late for your appointment, you may be asked to reschedule.  We strive to give you quality time with our providers, and arriving late affects you and other patients whose appointments are after yours.  If you are a no show for multiple scheduled visits, you may be dismissed from the clinic at the providers discretion.   Again, thank you for choosing [facility], our hope is that these requests will decrease the amount of time that you wait before being seen by our physicians.  ______________________________________________________________________ Should you have questions after your visit to the  Cancer Center, please contact our office at [phone] between the hours of 8:30 and 4:30 p.m.    Voicemails left after 4:30p.m will not be returned until the following business day.   For prescription refill requests, please have your pharmacy contact us directly.  Please also try to allow 48 hours for prescription requests.   Please contact the scheduling department for questions regarding scheduling.  For scheduling of procedures such as PET scans, CT scans, MRI, Ultrasound, etc please contact central scheduling at [phone].   Resources For Cancer Patients and Caregivers:  American Cancer Society:  800-227-2345  Can help patients locate various types of support and financial assistance Cancer Care: 1-800-813-HOPE (4673) Provides financial assistance, online support groups, medication/co-pay assistance.   [place] DSS:  336-641-3447 Where to apply for food stamps, Medicaid, and utility assistance Medicare Rights Center: 800-333-4114 Helps people with Medicare understand their rights and benefits, navigate the Medicare system, and secure the  quality healthcare they deserve SCAT: 336-333-6589 [place] [employer]'s shared-ride transportation service for eligible riders who have a disability that prevents them from riding the fixed route bus.   For additional information on assistance programs please contact our social worker:   Grier Hock/Abigail Elmore:  [phone] 

## 2017-03-21 NOTE — Progress Notes (Addendum)
Marland Kitchen  HEMATOLOGY ONCOLOGY PROGRESS NOTE  Date of service: .03/21/2017  Patient Care Team: Peggyann Juba, NP as PCP - General  CC: Follow-up for thrombocytopenia /ITP  ONCOLOGIC HISTORY  T1N0 right breast cancer in 1990, on observation since completing adjuvant CMF chemotherapy and 5 years of tamoxifen. Mammograms done at [facility] 01-21-2016, report scanned in this EMR.  She also has history of IB grade 1 endometrial cancer treated with TAH, BSO, pelvic lymphadnectomy by Dr Josephina Shih  11-29-2006.  Mild thrombocytopenia present intermittently at least back to 2011.  She had CMF chemotherapy in 1990. Abdominal US done in [facility] system 12-2015 "spleen 9.9 cm, negative" and no mention of adenopathy. Platelets did not seem to increase when she was on steroids for Crohn's flare 08-2015 and did not improve with change from long term dilantin to keppra. Bone marrow exam and cytogenetics 08-25-16 normal trilineage hematopoiesis with increase in normal megakaryocytes and normal cytogenetics, suggesting ITP.   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 Patient was previously followed by Dr. Vaughan Basta assay and was last seen on 09/30/2016. Patient notes no issues with bleeding or bruising. No gum bleeds. No epistaxis. Does not note any new breast lumps or bumps. Notes that her mammogram has been scheduled for 03/23/2017 at [facility]. She reports that she will send [place] a copy of the results for our records. No pelvic bleeding. Notes her Crohn's disease stable - follows with Dr Hilarie Fredrickson.  REVIEW OF SYSTEMS:    10 Point review of systems of done and is negative except as noted above.  . Past Medical History:  Diagnosis Date  . Bowel obstruction   . Colitis   . Crohn's disease ([place])   . Epilepsy ([place])   . Malignant neoplasm of breast (female), unspecified site    right  . Malignant neoplasm of corpus uteri, except isthmus ([place])     . Osteoporosis, unspecified   . Personal history of colonic polyps    hyperplastic  . Pneumonia   . Renal cyst   . Seizure disorder ([place])   . Thrombocytopenia ([place])     . Past Surgical History:  Procedure Laterality Date  . ABDOMINAL HYSTERECTOMY    . BREAST LUMPECTOMY    . CHOLECYSTECTOMY    . COLON RESECTION    . COLONOSCOPY WITH PROPOFOL N/A 10/15/2015   Procedure: COLONOSCOPY WITH PROPOFOL;  Surgeon: Jerene Bears, MD;  Location: [facility];  Service: Gastroenterology;  Laterality: N/A;  . ELBOW SURGERY     left    . Social History  Substance Use Topics  . Smoking status: Never Smoker  . Smokeless tobacco: Never Used  . Alcohol use No    ALLERGIES:  is allergic to ampicillin; penicillins; pneumococcal vac polyvalent; and tetanus toxoid.  MEDICATIONS:  Current Outpatient Prescriptions  Medication Sig Dispense Refill  . aspirin EC 81 MG tablet Take 81 mg by mouth daily.    . Calcium Carb-Cholecalciferol (CALCIUM 1000 + D PO) Take 1,000 mg by mouth 2 (two) times daily.    . Cholecalciferol (VITAMIN D) 1000 UNITS capsule Take 1,000 Units by mouth 2 (two) times daily.      . Cranberry 500 MG CAPS Take 500 mg by mouth daily.    . cyanocobalamin (,VITAMIN B-12,) 1000 MCG/ML injection Inject 1,000 mcg into the muscle every 30 (thirty) days.     Marland Kitchen levETIRAcetam (KEPPRA XR) 500 MG 24 hr tablet Take 2 tablets  at bedtime 60 tablet 11  . mesalamine (PENTASA) 250 MG CR capsule Take 4 capsules (1,000 mg total) by mouth 3 (three) times daily. 360 capsule 2  . potassium chloride SA (K-DUR,KLOR-CON) 20 MEQ tablet Take 40 mEq by mouth 2 (two) times daily.     . pravastatin (PRAVACHOL) 10 MG tablet Take 5 mg by mouth daily.     No current facility-administered medications for this visit.     PHYSICAL EXAMINATION: ECOG PERFORMANCE STATUS: 1 - Symptomatic but completely ambulatory  . Vitals:   03/21/17 1029  BP: 137/74  Pulse: 76  Resp: 18  Temp: 97.7 F (36.5 C)    Filed  Weights   03/21/17 1029  Weight: 123 lb 5 oz (55.9 kg)   .Body mass index is 23.3 kg/m.  GENERAL:alert, in no acute distress and comfortable SKIN: no acute rashes, no significant lesions EYES: conjunctiva are pink and non-injected, sclera anicteric OROPHARYNX: MMM, no exudates, no oropharyngeal erythema or ulceration NECK: supple, no JVD LYMPH:  no palpable lymphadenopathy in the cervical, axillary or inguinal regions LUNGS: clear to auscultation b/l with normal respiratory effort HEART: regular rate & rhythm ABDOMEN:  normoactive bowel sounds , non tender, not distended. Extremity: no pedal edema PSYCH: alert & oriented x 3 with fluent speech NEURO: no focal motor/sensory deficits  LABORATORY DATA:   I have reviewed the data as listed  . CBC Latest Ref Rng & Units 03/21/2017 09/20/2016 08/25/2016  WBC 3.9 - 10.3 10e3/uL 3.8(L) 3.6(L) 4.5  Hemoglobin 11.6 - 15.9 g/dL 12.5 11.7 12.5  Hematocrit 34.8 - 46.6 % 37.7 34.6(L) 37.3  Platelets 145 - 400 10e3/uL 106(L) 95(L) 113(L)    . CMP Latest Ref Rng & Units 03/21/2017 07/26/2016 09/04/2015  Glucose 70 - 140 mg/dl 88 138 99  BUN 7.0 - 26.0 mg/dL 8.6 8.3 <5(L)  Creatinine 0.6 - 1.1 mg/dL 1.1 1.1 0.82  Sodium 136 - 145 mEq/L 139 137 138  Potassium 3.5 - 5.1 mEq/L 4.1 3.7 2.6(LL)  Chloride 101 - 111 mmol/L - - 112(H)  CO2 22 - 29 mEq/L 19(L) 22 20(L)  Calcium 8.4 - 10.4 mg/dL 9.4 9.2 7.2(L)  Total Protein 6.4 - 8.3 g/dL 7.0 7.1 4.8(L)  Total Bilirubin 0.20 - 1.20 mg/dL 0.65 0.60 0.3  Alkaline Phos 40 - 150 U/L 64 77 83  AST 5 - 34 U/L 25 25 20   ALT 0 - 55 U/L 24 27 16      RADIOGRAPHIC STUDIES: I have personally reviewed the radiological images as listed and agreed with the findings in the report. No results found.  ASSESSMENT & PLAN:   1) T1N0 right breast cancer in 1990, on observation since completing adjuvant CMF chemotherapy and 5 years of tamoxifen. Plan -Patient has no clinical evidence of breast cancer recurrence at  this time. Recent breast exam by primary care physician. - Mammograms done at [facility] Scheduled for 03/23/2017. Have been ordered by her primary care physician. She was recommended to send [place] a copy for our records.   2)She also has history of IB grade 1 endometrial cancer treated with TAH, BSO, pelvic lymphadnectomy by Dr Josephina Shih  11-29-2006. Plan -Continue follow-up with GYN and primary care physician.  3) Mild thrombocytopenia present intermittently at least back to 2011.  She had CMF chemotherapy in 1990. Abdominal US done in [facility] system 12-2015 "spleen 9.9 cm, negative" and no mention of adenopathy. Platelets did not seem to increase when she was on steroids for Crohn's flare 08-2015 and  did not improve with change from long term dilantin to keppra. Bone marrow exam and cytogenetics 08-25-16 normal trilineage hematopoiesis with increase in normal megakaryocytes and normal cytogenetics, suggesting ITP. Plan -Currently platelet counts are 106k and a diagnosis of ITP cannot be established with platelet counts of more than 100k. 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 -Possibly immune mediated in the setting of history of Crohn's disease. Also could be related to one of her medications including the Keppra or Pentasa. These medications however important to her control of her seizure disorder and Crohn's disease and would not need to be changed unless there is a significant drop in her platelet counts. -No indication for treatment of her thrombus cytopenia at this time.  Return to clinic with Dr. Irene Limbo in 6 months with repeat labs  I spent 20 minutes counseling the patient face to face. The total time spent in the appointment was 25 minutes and more than 50% was on counseling and direct patient cares.    Sullivan Lone MD MS AAHIVMS [facility] Hematology/Oncology Physician [facility]  (Office):       [phone] (Work cell):   [phone] (Fax):           [phone]   Addendum   Received MMG report dated 03/23/2017 from [facility]  [place] 1 - -no mammographic evidence of malignancy.  Brunetta Genera MD

## 2017-03-21 NOTE — Telephone Encounter (Signed)
Gave patient AVS and calender per 4/9 los. - 6 month f/u

## 2017-05-11 ENCOUNTER — Telehealth: Payer: Self-pay | Admitting: Neurology

## 2017-05-11 NOTE — Telephone Encounter (Signed)
Returned pt call.  Pt asked if she was still suppose to take her Keppra as directed since she was given 2 Keppra while in the hospital this morning around 9am.  I let her know that yes, she is to take her medications as directed.  I also informed her of her ER follow up appointment with Dr. Delice Lesch scheduled for Wednesday, May 18, 2017 at 3pm.

## 2017-05-11 NOTE — Telephone Encounter (Signed)
PT called and said she was in the hospital with a seizure and has a question regarding her seizure medication

## 2017-05-18 ENCOUNTER — Ambulatory Visit (INDEPENDENT_AMBULATORY_CARE_PROVIDER_SITE_OTHER): Payer: Medicare Other | Admitting: Neurology

## 2017-05-18 ENCOUNTER — Encounter: Payer: Self-pay | Admitting: Neurology

## 2017-05-18 ENCOUNTER — Other Ambulatory Visit: Payer: Medicare Other

## 2017-05-18 VITALS — BP 138/84 | HR 82 | Ht 62.0 in | Wt 124.0 lb

## 2017-05-18 DIAGNOSIS — G40309 Generalized idiopathic epilepsy and epileptic syndromes, not intractable, without status epilepticus: Secondary | ICD-10-CM | POA: Diagnosis not present

## 2017-05-18 DIAGNOSIS — S22000A Wedge compression fracture of unspecified thoracic vertebra, initial encounter for closed fracture: Secondary | ICD-10-CM

## 2017-05-18 MED ORDER — LEVETIRACETAM ER 500 MG PO TB24: ORAL_TABLET | ORAL | 11 refills | Status: DC

## 2017-05-18 NOTE — Patient Instructions (Signed)
1. Increase Keppra XR 547m: Take 3 tablets at night 2. Bloodwork for Keppra level 3. Refer to Ortho for compression fracture 4. Call your PCP about pain medication 5. As per [place] driving laws, no driving until 6 months seizure-free. We will fill out DMV forms for you 6. Follow-up as scheduled in September

## 2017-05-18 NOTE — Progress Notes (Signed)
NEUROLOGY FOLLOW UP OFFICE NOTE  [name] [id number]  HISTORY OF PRESENT ILLNESS: I had the pleasure of seeing [name] in follow-up in the neurology clinic on 05/18/2017. The patient was last seen 9 months ago due to concern that thrombocytopenia could be related to Dilantin or slightly increased spleen size. She was tapered off Dilantin and had been taking Keppra XR 101m qhs with no seizures since 2013. She was in the ER last 05/11/17 after a breakthrough nocturnal seizure. She recalls seeing her husband go to the bathroom at 5 or 6 in the morning, then has no recollection of events until waking up with EMS around her. Apparently she vocalized in bed and started having a convulsion with both arms drawn up, shaking and stiff for around 2 minutes. She was confused on EMS arrival, but back to baseline in the ER. She reports taking only 5035mof Keppra the night prior because she thought she already took it. She denies any sleep deprivation. Bloodwork unremarkable, head CT no acute changes. She had an abnormal urinalysis with small LE, WBC 9, many bacteria, moderate blood. Urine culture showed 10,000-25,000 cfu/ml with polymicrobial growth. She was discharged on antibiotics. She did not have any UTI symptoms, but states that breakthrough seizures in the past occurred with a UTI or missing a dose of medication. She was complaining of back pain, and thoracic xray showed a T2 compression fracture, likely subacute, as well as remote T3-7 compression fractures with advanced height loss. She was given a prescription for Percocet, she states Tylenol does not help. She denies any headaches, dizziness, focal numbness/tingling/weakness, bowel/bladder dysfunction. Her Keppra level was 23.1. Platelet count was 86 (121 in 02/2017).  HPI: This is a pleasant [age] RH woman with a history of Crohn's disease, uterine cancer, and breast cancer s/p lumpectomy, chemo and radiation therapy 5 years ago, who  started having seizures at age [id number] recalls going to the bathroom then back to bed early morning, then her next recollection was being brought to the hospital. Her husband heard her scream out followed by whole body stiffening and shaking lasting 2-3 minutes, with associated tongue bite and urinary incontinence. She was brought to the hospital and not started on medication. Six months after, she had a second convulsion out of sleep with similar screaming out and body stiffening and shaking. She was again brought to the hospital, and per patient, not started on medication. A month later, she had a third nocturnal convulsion and saw her PCP who started her on Dilantin 40018may. She may or may not have seen a neurologist, she is unsure, but does not recall having an EEG done. She recalls being told that she had UTIs when she had the seizures. She was seizure-free for 9 years until 2013 when she forgot to take her medication and had another nocturnal convulsion where she fractured her right arm. Dilantin dose was increased to 600m61my. After a few months, she started having dizziness and difficulty ambulating. She went to the ER where he Dilantin level was supratherapeutic and she was instructed to stop the medication for a few days then restart at 200mg75m. She has been doing well since with no seizures and no further side effects. She and her husband deny any staring/unresponsive episodes, no gaps in time, olfactory/gustatory hallucinations, deja vu, rising epigastric sensation, focal numbness/tingling/weakness, myoclonic jerks. She denies any dizziness, diplopia, gait instability, gum problems. She had a DEXA scan in 11/2012 and has a  diagnosis of osteoporosis. She denies any headaches, dysarthria, dysphagia, neck/back pain, bowel/bladder dysfunction. No chest pain or shortness of breath. She is driving.  Epilepsy Risk Factors: Her maternal aunt had seizures, otherwise she had a  normal birth and early development. There is no history of febrile convulsions, CNS infections such as meningitis/encephalitis, significant traumatic brain injury, neurosurgical procedures.  Prior AEDs: none EEG 05/01/14: occasional independent focal slowing over the bilateral temporal regions, left greater than right. Imaging: Head CT without contrast done 07/13/2013 reported as unremarkable.  PAST MEDICAL HISTORY: Past Medical History:  Diagnosis Date  . Bowel obstruction ([place])   . Colitis   . Crohn's disease ([place])   . Epilepsy ([place])   . Malignant neoplasm of breast (female), unspecified site    right  . Malignant neoplasm of corpus uteri, except isthmus ([place])   . Osteoporosis, unspecified   . Personal history of colonic polyps    hyperplastic  . Pneumonia   . Renal cyst   . Seizure disorder ([place])   . Thrombocytopenia ([place])     MEDICATIONS: Current Outpatient Prescriptions on File Prior to Visit  Medication Sig Dispense Refill  . aspirin EC 81 MG tablet Take 81 mg by mouth daily.    . Calcium Carb-Cholecalciferol (CALCIUM 1000 + D PO) Take 1,000 mg by mouth 2 (two) times daily.    . Cholecalciferol (VITAMIN D) 1000 UNITS capsule Take 1,000 Units by mouth 2 (two) times daily.      . Cranberry 500 MG CAPS Take 500 mg by mouth daily.    . cyanocobalamin (,VITAMIN B-12,) 1000 MCG/ML injection Inject 1,000 mcg into the muscle every 30 (thirty) days.     Marland Kitchen levETIRAcetam (KEPPRA XR) 500 MG 24 hr tablet Take 2 tablets at bedtime 60 tablet 11  . mesalamine (PENTASA) 250 MG CR capsule Take 4 capsules (1,000 mg total) by mouth 3 (three) times daily. 360 capsule 2  . potassium chloride SA (K-DUR,KLOR-CON) 20 MEQ tablet Take 40 mEq by mouth 2 (two) times daily.     . pravastatin (PRAVACHOL) 10 MG tablet Take 5 mg by mouth daily.     No current facility-administered medications on file prior to visit.     ALLERGIES: Allergies  Allergen Reactions  . Ampicillin Diarrhea  . Penicillins  Diarrhea    Has patient had a PCN reaction causing immediate rash, facial/tongue/throat swelling, SOB or lightheadedness with hypotension: No Has patient had a PCN reaction causing severe rash involving mucus membranes or skin necrosis: No Has patient had a PCN reaction that required hospitalization No Has patient had a PCN reaction occurring within the last 10 years: No If all of the above answers are "NO", then may proceed with Cephalosporin use.  . Pneumococcal Vac Polyvalent Other (See Comments)    Local swelling  . Tetanus Toxoid Swelling    Arm swelling    FAMILY HISTORY: Family History  Problem Relation Age of Onset  . Heart disease Mother   . Aneurysm Father   . Breast cancer Maternal Aunt   . Colon cancer Neg Hx     SOCIAL HISTORY: Social History   Social History  . Marital status: Married    Spouse name: N/A  . Number of children: 2  . Years of education: N/A   Occupational History  . Retired Retired   Social History Main Topics  . Smoking status: Never Smoker  . Smokeless tobacco: Never Used  . Alcohol use No  . Drug use: No  .  Sexual activity: Yes    Birth control/ protection: None   Other Topics Concern  . Not on file   Social History Narrative  . No narrative on file    REVIEW OF SYSTEMS: Constitutional: No fevers, chills, or sweats, no generalized fatigue, change in appetite Eyes: No visual changes, double vision, eye pain Ear, nose and throat: No hearing loss, ear pain, nasal congestion, sore throat Cardiovascular: No chest pain, palpitations Respiratory:  No shortness of breath at rest or with exertion, wheezes GastrointestinaI: No nausea, vomiting, diarrhea, abdominal pain, fecal incontinence Genitourinary:  No dysuria, urinary retention or frequency Musculoskeletal:  No neck pain, +back pain Integumentary: No rash, pruritus, skin lesions Neurological: as above Psychiatric: No depression, insomnia, anxiety Endocrine: No palpitations,  fatigue, diaphoresis, mood swings, change in appetite, change in weight, increased thirst Hematologic/Lymphatic:  No anemia, purpura, petechiae. Allergic/Immunologic: no itchy/runny eyes, nasal congestion, recent allergic reactions, rashes  PHYSICAL EXAM: Vitals:   05/18/17 1458  BP: 138/84  Pulse: 82   General: No acute distress Head:  Normocephalic/atraumatic Neck: supple, no paraspinal tenderness, full range of motion Heart:  Regular rate and rhythm Lungs:  Clear to auscultation bilaterally Back: No paraspinal tenderness Skin/Extremities: No rash, no edema Neurological Exam: alert and oriented to person, place, and time. No aphasia or dysarthria. Fund of knowledge is appropriate.  Recent and remote memory are intact. Attention and concentration are normal.    Able to name objects and repeat phrases. Cranial nerves: Pupils equal, round, reactive to light.  Extraocular movements intact with no nystagmus. Visual fields full. Facial sensation intact. No facial asymmetry. Tongue, uvula, palate midline.  Motor: Bulk and tone normal, muscle strength 5/5 throughout with no pronator drift.  Sensation to light touch intact.  No extinction to double simultaneous stimulation.  Deep tendon reflexes 2+ throughout, toes downgoing.  Finger to nose testing intact.  Gait slow and cautious due to back pain.   IMPRESSION: This is a pleasant 71 yo RH woman with a history of Crohn's disease, uterine cancer, breast cancer s/p lumpectomy, chemo, and radiation therapy, with a history of convulsive seizures that occur out of sleep since age 42 or 24. She had another nocturnal seizure last 05/11/17, prior to this, the last seizure was in 2013. She reports only taking 587m Keppra the night prior, no other triggers except for possible UTI. She was treated with nitrofurantoin. A repeat Keppra level will be checked today for comparison, however we have agreed to increase Keppra XR dose to 15058mqhs. She continues to report  back pain and will be referred to Ortho. She is aware of [place] driving laws to stop driving after a seizure, until 6 months seizure-free. As all her seizures have been nocturnal, we will fill out DMV forms for the Medical [employer] Board to decide if she can drive earlier than the 6 months. She will follow-up as scheduled in 3 months and knows to call our office for any problems.  Thank you for allowing me to participate in her care.  Please do not hesitate to call for any questions or concerns.  The duration of this appointment visit was 25 minutes of face-to-face time with the patient.  Greater than 50% of this time was spent in counseling, explanation of diagnosis, planning of further management, and coordination of care.   KaEllouise NewerM.D.   CC: DoEstrella DeedsNP; Dr. LiMarko Plume

## 2017-05-21 LAB — LEVETIRACETAM LEVEL: Keppra (Levetiracetam): 22.2 ug/mL

## 2017-05-23 ENCOUNTER — Telehealth: Payer: Self-pay

## 2017-05-23 NOTE — Telephone Encounter (Signed)
-----   Message from Cameron Sprang, MD sent at 05/23/2017  3:24 PM EDT ----- Pls let her know there was not much difference in the Keppra level, it was 22.2, in the hospital 23.1. Continue on higher dose Keppra. Thanks

## 2017-05-23 NOTE — Telephone Encounter (Signed)
Spoke with pt relaying message below.  Pt appreciative.

## 2017-06-08 ENCOUNTER — Telehealth: Payer: Self-pay | Admitting: Neurology

## 2017-06-08 ENCOUNTER — Telehealth: Payer: Self-pay

## 2017-06-08 NOTE — Telephone Encounter (Signed)
Neurology sections of [place] [employer] forms filled out and faxed to [employer]

## 2017-06-08 NOTE — Telephone Encounter (Signed)
PT left a message that she was returning your call

## 2017-06-20 ENCOUNTER — Telehealth: Payer: Self-pay | Admitting: Neurology

## 2017-06-20 NOTE — Telephone Encounter (Signed)
PT wanted to know if the paperwork has been sent to [facility] so she can drive during the day but not at night

## 2017-06-20 NOTE — Telephone Encounter (Signed)
Spoke with pt letting her know that I faxed our sections of the [facility] paperwork on June 27th.  I apologized for not calling her before and letting her know.

## 2017-07-06 ENCOUNTER — Other Ambulatory Visit: Payer: Self-pay | Admitting: Internal Medicine

## 2017-07-07 ENCOUNTER — Other Ambulatory Visit: Payer: Self-pay | Admitting: Internal Medicine

## 2017-07-07 ENCOUNTER — Telehealth: Payer: Self-pay | Admitting: Internal Medicine

## 2017-07-07 NOTE — Telephone Encounter (Signed)
I have spoken to patient and have scheduled an appointment for 09/12/17 @ 11 am. Pentasa refills sent until that time.

## 2017-07-11 ENCOUNTER — Telehealth: Payer: Self-pay | Admitting: Neurology

## 2017-07-11 NOTE — Telephone Encounter (Signed)
She received a paper from the [facility]. She is unsure if it is something that Dr. Delice Lesch has already sent in or something new. There is a part for her to fill out as well. The paper says before anything can be done it needs to be filled out. Please Advise. Please call. Thanks

## 2017-07-13 ENCOUNTER — Encounter: Payer: Self-pay | Admitting: Neurology

## 2017-07-13 NOTE — Telephone Encounter (Signed)
Pt came into the office dropping off letter from [facility].  Requested letter written and faxed to [facility].

## 2017-07-25 ENCOUNTER — Telehealth: Payer: Self-pay | Admitting: Neurology

## 2017-07-25 NOTE — Telephone Encounter (Signed)
PT called and said she had a seizure and wanted to know if Dr Delice Lesch should see her or not

## 2017-07-25 NOTE — Telephone Encounter (Signed)
Spoke with pt.  She states that she had a seizure about a week ago.  She was admitted to the hospital [facility]) for a few days because her O2 levels kept going down.  She states that she did not miss amy doses of her Keppra, but did have a bladder infection which the doctors in [place] contributed to her seizure.  Advised per Dr. Delice Lesch for pt to increase Keppra to 206m qhs.  I will call for records

## 2017-08-12 ENCOUNTER — Other Ambulatory Visit: Payer: Self-pay

## 2017-08-12 ENCOUNTER — Telehealth: Payer: Self-pay | Admitting: Neurology

## 2017-08-12 DIAGNOSIS — G40309 Generalized idiopathic epilepsy and epileptic syndromes, not intractable, without status epilepticus: Secondary | ICD-10-CM

## 2017-08-12 MED ORDER — LEVETIRACETAM ER 500 MG PO TB24: ORAL_TABLET | ORAL | 11 refills | Status: DC

## 2017-08-12 NOTE — Telephone Encounter (Signed)
Patient is checking the status on this please call her back

## 2017-08-12 NOTE — Telephone Encounter (Signed)
Returned pt's call for medication dosage clarification.  Message stats that pt takes 4 Keppra tabs daily but Rx  Is written for 3 tabs daily.    No answer, LMOM asking pt to return my call

## 2017-08-12 NOTE — Telephone Encounter (Signed)
Patient needs the Keppra refilled she states that she will be 2 pill short before she come in to see Dr Delice Lesch. She states that she takes 4 pills a day and on Thursday she will on be able to take 2 pills unless we cal her in some  She uses the [facility] in [place]

## 2017-08-19 ENCOUNTER — Encounter: Payer: Self-pay | Admitting: Neurology

## 2017-08-19 ENCOUNTER — Ambulatory Visit (INDEPENDENT_AMBULATORY_CARE_PROVIDER_SITE_OTHER): Payer: Medicare Other | Admitting: Neurology

## 2017-08-19 VITALS — BP 104/70 | HR 96 | Ht 61.0 in | Wt 116.0 lb

## 2017-08-19 DIAGNOSIS — G40309 Generalized idiopathic epilepsy and epileptic syndromes, not intractable, without status epilepticus: Secondary | ICD-10-CM

## 2017-08-19 MED ORDER — LAMOTRIGINE 25 MG PO TABS: ORAL_TABLET | ORAL | 6 refills | Status: DC

## 2017-08-19 NOTE — Progress Notes (Signed)
NEUROLOGY FOLLOW UP OFFICE NOTE  MANJU KULKARNI [id number]  HISTORY OF PRESENT ILLNESS: I had the pleasure of seeing [name] in follow-up in the neurology clinic on 08/19/2017. The patient was last seen 3 months ago for nocturnal seizures. She had previously been concerned about thrombocytopenia could be related to Dilantin or slightly increased spleen size. She was tapered off Dilantin and had been taking Keppra XR 1013m qhs with no seizures since 2013. Over the past 3 months, she has had 2 nocturnal convulsions. On her last visit, she reported a seizure on 05/11/17. She had a UTI at that time. Keppra XR dose was increased to 15046mdaily. She was back in the ER last 07/13/17 after another nocturnal seizure, she has no recollection and had the seizure at night on her recliner. [facility]5 and T2 compression fractures. She had been treated for a UTI for a week with Nitrofurantoin, then still had recurrent symptoms and was taking Ciprofloxacin for 2 days then had the seizure. She reports a similar scenario last May with taking Nitrofurantoin first, then Cipro. Cipro is now listed as an allergy. Keppra dose increased to 200055maily. No side effects. She reports when she was seizure-free from 2013 to 2018, she did have UTIs and was treated with Cipro, but was not treated first with Nitrofurantoin then Cipro like recently. She denies any headaches, dizziness, vision changes, neck/back pain, focal numbness/tingling/weakness. She has a remote history of kidney stones and had a urology evaluation for recurrent UTIs, now on prophylactic treatment with Trimethoprim.   HPI: This is a pleasant [age] RH woman with a history of Crohn's disease, uterine cancer, and breast cancer s/p lumpectomy, chemo and radiation therapy 5 years ago, who started having seizures at age [age]he recalls going to the bathroom then back to bed early morning, then her  next recollection was being brought to the hospital. Her husband heard her scream out followed by whole body stiffening and shaking lasting 2-3 minutes, with associated tongue bite and urinary incontinence. She was brought to the hospital and not started on medication. Six months after, she had a second convulsion out of sleep with similar screaming out and body stiffening and shaking. She was again brought to the hospital, and per patient, not started on medication. A month later, she had a third nocturnal convulsion and saw her PCP who started her on Dilantin 400m77my. She may or may not have seen a neurologist, she is unsure, but does not recall having an EEG done. She recalls being told that she had UTIs when she had the seizures. She was seizure-free for 9 years until 2013 when she forgot to take her medication and had another nocturnal convulsion where she fractured her right arm. Dilantin dose was increased to 600mg72m. After a few months, she started having dizziness and difficulty ambulating. She went to the ER where he Dilantin level was supratherapeutic and she was instructed to stop the medication for a few days then restart at 200mg 5m She has been doing well since with no seizures and no further side effects. She and her husband deny any staring/unresponsive episodes, no gaps in time, olfactory/gustatory hallucinations, deja vu, rising epigastric sensation, focal numbness/tingling/weakness, myoclonic jerks. She denies any dizziness, diplopia, gait instability, gum problems. She had a DEXA scan in 11/2012 and has a diagnosis of osteoporosis. She denies any headaches, dysarthria, dysphagia, neck/back pain, bowel/bladder dysfunction. No  chest pain or shortness of breath. She is driving.  She was in the ER last 05/11/17 after a breakthrough nocturnal seizure. She recalls seeing her husband go to the bathroom at 5 or 6 in the morning, then has no recollection of events until waking up  with EMS around her. Apparently she vocalized in bed and started having a convulsion with both arms drawn up, shaking and stiff for around 2 minutes. She was confused on EMS arrival, but back to baseline in the ER. She reports taking only 589m of Keppra the night prior because she thought she already took it. She denies any sleep deprivation. Bloodwork unremarkable, head CT no acute changes. She had an abnormal urinalysis with small LE, WBC 9, many bacteria, moderate blood. Urine culture showed 10,000-25,000 cfu/ml with polymicrobial growth. She was discharged on antibiotics. She did not have any UTI symptoms, but states that breakthrough seizures in the past occurred with a UTI or missing a dose of medication. She was complaining of back pain, and thoracic xray showed a T2 compression fracture, likely subacute, as well as remote T3-7 compression fractures with advanced height loss. She was given a prescription for Percocet, she states Tylenol does not help. Her Keppra level was 23.1. Platelet count was 86 (121 in 02/2017).  Epilepsy Risk Factors: Her maternal aunt had seizures, otherwise she had a normal birth and early development. There is no history of febrile convulsions, CNS infections such as meningitis/encephalitis, significant traumatic brain injury, neurosurgical procedures.  Prior AEDs: Dilantin  EEG 05/01/14: occasional independent focal slowing over the bilateral temporal regions, left greater than right. Imaging: Head CT without contrast done 07/13/2013 reported as unremarkable.  PAST MEDICAL HISTORY: Past Medical History:  Diagnosis Date  . Bowel obstruction ([facility]   . Colitis   . Crohn's disease ([facility]   . Epilepsy ([facility] [place]   . Malignant neoplasm of breast (female), unspecified site    right  . Malignant neoplasm of corpus uteri, except isthmus ([facility]   . Osteoporosis, unspecified   . Personal history of colonic polyps    hyperplastic  . Pneumonia   . Renal cyst   . Seizure disorder  ([facility]   . Thrombocytopenia ([facility]     MEDICATIONS: Current Outpatient Prescriptions on File Prior to Visit  Medication Sig Dispense Refill  . aspirin EC 81 MG tablet Take 81 mg by mouth daily.    . Calcium Carb-Cholecalciferol (CALCIUM 1000 + D PO) Take 1,000 mg by mouth 2 (two) times daily.    . Cholecalciferol (VITAMIN D) 1000 UNITS capsule Take 1,000 Units by mouth 2 (two) times daily.      . Cranberry 500 MG CAPS Take 500 mg by mouth daily.    . cyanocobalamin (,VITAMIN B-12,) 1000 MCG/ML injection Inject 1,000 mcg into the muscle every 30 (thirty) days.     .Marland KitchenlevETIRAcetam (KEPPRA XR) 500 MG 24 hr tablet Take 4 tablets at bedtime 120 tablet 11  . mesalamine (PENTASA) 250 MG CR capsule Take 4 capsules (1,000 mg total) by mouth 3 (three) times daily. 360 capsule 2  . potassium chloride SA (K-DUR,KLOR-CON) 20 MEQ tablet Take 40 mEq by mouth 2 (two) times daily.     . pravastatin (PRAVACHOL) 10 MG tablet Take 5 mg by mouth daily.     No current facility-administered medications on file prior to visit.     ALLERGIES: Allergies  Allergen Reactions  . Ciprofloxacin Other (See Comments)    Seizures  . Ampicillin Diarrhea  . Penicillins  Diarrhea    Has patient had a PCN reaction causing immediate rash, facial/tongue/throat swelling, SOB or lightheadedness with hypotension: No Has patient had a PCN reaction causing severe rash involving mucus membranes or skin necrosis: No Has patient had a PCN reaction that required hospitalization No Has patient had a PCN reaction occurring within the last 10 years: No If all of the above answers are "NO", then may proceed with Cephalosporin use.  . Pneumococcal Vac Polyvalent Other (See Comments)    Local swelling  . Tetanus Toxoid Swelling    Arm swelling    FAMILY HISTORY: Family History  Problem Relation Age of Onset  . Heart disease Mother   . Aneurysm Father   . Breast cancer Maternal Aunt   . Colon cancer Neg Hx     SOCIAL  HISTORY: Social History   Social History  . Marital status: Married    Spouse name: N/A  . Number of children: 2  . Years of education: N/A   Occupational History  . Retired Retired   Social History Main Topics  . Smoking status: Never Smoker  . Smokeless tobacco: Never Used  . Alcohol use No  . Drug use: No  . Sexual activity: Yes    Birth control/ protection: None   Other Topics Concern  . Not on file   Social History Narrative   Pt lives at home with her husband in a one story house.    REVIEW OF SYSTEMS: Constitutional: No fevers, chills, or sweats, no generalized fatigue, change in appetite Eyes: No visual changes, double vision, eye pain Ear, nose and throat: No hearing loss, ear pain, nasal congestion, sore throat Cardiovascular: No chest pain, palpitations Respiratory:  No shortness of breath at rest or with exertion, wheezes GastrointestinaI: No nausea, vomiting, diarrhea, abdominal pain, fecal incontinence Genitourinary:  No dysuria, urinary retention or frequency Musculoskeletal:  No neck pain, back pain Integumentary: No rash, pruritus, skin lesions Neurological: as above Psychiatric: No depression, insomnia, anxiety Endocrine: No palpitations, fatigue, diaphoresis, mood swings, change in appetite, change in weight, increased thirst Hematologic/Lymphatic:  No anemia, purpura, petechiae. Allergic/Immunologic: no itchy/runny eyes, nasal congestion, recent allergic reactions, rashes  PHYSICAL EXAM: Vitals:   08/19/17 0951  BP: 104/70  Pulse: 96   General: No acute distress Head:  Normocephalic/atraumatic Neck: supple, no paraspinal tenderness, full range of motion Heart:  Regular rate and rhythm Lungs:  Clear to auscultation bilaterally Back: No paraspinal tenderness Skin/Extremities: No rash, no edema Neurological Exam: alert and oriented to person, place, and time. No aphasia or dysarthria. Fund of knowledge is appropriate.  Recent and remote memory  are intact. Attention and concentration are normal.    Able to name objects and repeat phrases. Cranial nerves: Pupils equal, round, reactive to light.  Extraocular movements intact with no nystagmus. Visual fields full. Facial sensation intact. No facial asymmetry. Tongue, uvula, palate midline.  Motor: Bulk and tone normal, muscle strength 5/5 throughout with no pronator drift.  Sensation to light touch intact.  No extinction to double simultaneous stimulation.  Deep tendon reflexes 2+ throughout, toes downgoing.  Finger to nose testing intact.  Gait slow and cautious, unable to tandem walk.  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 She had been seizure-free since 2013, until recently with 2 breakthrough seizures in  the past 3 months, both in the setting of UTI for more than a week and Ciprofloxacin. She has been on Cipro before with no difficulties, but the prolonged UTI in addition to Cipro may have been the trigger for lowering seizure threshold. She is now on prophylactic treatment for recurrent UTIs. With recent increase in seizures, MRI brain with and without contrast will be ordered to assess for underlying structural abnormality. She is now on Keppra XR 2069m daily with no side effects. We discussed the option of adding a second AED to hopefully provide more cushion, she is agreeable to adding on Lamotrigine 222mBID x 2 weeks, then increase to 501mID. Side effects, including SteKathreen Cosierndrome, were discussed. She is aware of [place] driving laws to stop driving after a seizure, until 6 months seizure-free. She will follow-up in 4 months and knows to call our office for any problems.  Thank you for allowing me to participate in her care.  Please do not hesitate to call for any questions or  concerns.  The duration of this appointment visit was 25 minutes of face-to-face time with the patient.  Greater than 50% of this time was spent in counseling, explanation of diagnosis, planning of further management, and coordination of care.   KarEllouise Newer.D.   CC: DonEstrella DeedsP; Dr. LivMarko Plume

## 2017-08-19 NOTE — Patient Instructions (Addendum)
1. Start Lamotrigine 2m: take 1 tablet twice a day for 2 weeks, then increase to 2 tablets twice a day 2. Continue Keppra XR 5039m Take 4 tablets at night 3. Schedule MRI brain with and without contrast 4. Follow-up in 4 months, call for any changes  Seizure Precautions: 1. If medication has been prescribed for you to prevent seizures, take it exactly as directed.  Do not stop taking the medicine without talking to your doctor first, even if you have not had a seizure in a long time.   2. Avoid activities in which a seizure would cause danger to yourself or to others.  Don't operate dangerous machinery, swim alone, or climb in high or dangerous places, such as on ladders, roofs, or girders.  Do not drive unless your doctor says you may.  3. If you have any warning that you may have a seizure, lay down in a safe place where you can't hurt yourself.    4.  No driving for 6 months from last seizure, [facility]  Please refer to the following link on the EpMinatareebsite for more information: http://[email or web address].org/[email or web address]/driving/drivingu.cfm   5.  Maintain good sleep hygiene. Avoid alcohol.  6.  Contact your doctor if you have any problems that may be related to the medicine you are taking.  7.  Call 911 and bring the patient back to the ED if:        A.  The seizure lasts longer than 5 minutes.       B.  The patient doesn't awaken shortly after the seizure  C.  The patient has new problems such as difficulty seeing, speaking or moving  D.  The patient was injured during the seizure  E.  The patient has a temperature over 102 F (39C)  F.  The patient vomited and now is having trouble breathing

## 2017-09-07 ENCOUNTER — Other Ambulatory Visit: Payer: Self-pay

## 2017-09-07 ENCOUNTER — Telehealth: Payer: Self-pay | Admitting: Neurology

## 2017-09-07 DIAGNOSIS — G40909 Epilepsy, unspecified, not intractable, without status epilepticus: Secondary | ICD-10-CM

## 2017-09-07 NOTE — Telephone Encounter (Signed)
Patient was calling because she was contacted by [facility] to have MRI set up and they said a wrong order was put in that they would call her back but never have. Please Advise. Thanks

## 2017-09-07 NOTE — Telephone Encounter (Signed)
Orders for MRI placed

## 2017-09-12 ENCOUNTER — Encounter: Payer: Self-pay | Admitting: Internal Medicine

## 2017-09-12 ENCOUNTER — Ambulatory Visit (INDEPENDENT_AMBULATORY_CARE_PROVIDER_SITE_OTHER): Payer: Medicare Other | Admitting: Internal Medicine

## 2017-09-12 VITALS — BP 100/66 | HR 80 | Ht 61.0 in | Wt 119.4 lb

## 2017-09-12 DIAGNOSIS — K5080 Crohn's disease of both small and large intestine without complications: Secondary | ICD-10-CM | POA: Diagnosis not present

## 2017-09-12 MED ORDER — MESALAMINE ER 250 MG PO CPCR: 1000.0000 mg | ORAL_CAPSULE | Freq: Three times a day (TID) | ORAL | 2 refills | Status: DC

## 2017-09-12 NOTE — Progress Notes (Signed)
   Subjective:    Patient ID: [name], female    DOB: 08-May-1946, 71 y.o.   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 She was last seen in January 2018. She's been doing well from a Crohn's disease standpoint. She's been maintained on Lialda 1 g 3 times a day. She denies abdominal pain. Stools vary somewhat with diet but are usually soft to formed. No diarrhea, blood in her stool or melena. She has 1-4 bowel movements per day. Appetite has been very good. Good energy levels. No nausea or vomiting. No dysphagia or odynophagia. She has continued B12 supplementation.  Her last colonoscopy was on 11-16 showing mild erythema and luminal narrowing at the ileocolonic anastomosis which was biopsied. There was slight edema and scattered erythema throughout the colon. Surveillance biopsies showed benign mucosa without dysplasia in the right colon and chronic inactive colitis in the left colon. Ileal biopsies showed chronic inactive colitis/ileitis consistent with IBD.  Unfortunately she has had 2 seizures this year, one in May and the other in August. She follows closely with neurology. She is not driving for 6 months. Her Keppra dose has been increased. Her seizure disorder dates back many years.   Review of Systems As per history of present illness, otherwise negative  Current Medications, Allergies, Past Medical History, Past Surgical History, Family History and Social History were reviewed in [employer] record.     Objective:   Physical Exam BP 100/66   Pulse 80   Ht 5' 1"  (1.549 m)   Wt 119 lb 6 oz (54.1 kg)   BMI 22.56 kg/m  Constitutional: Well-developed and well-nourished. No distress. HEENT: Normocephalic and atraumatic. Oropharynx is clear and moist. Conjunctivae are normal.  No scleral icterus. Neck: Neck supple. Trachea midline. Cardiovascular: Normal rate, regular rhythm and  intact distal pulses. No M/R/G Pulmonary/chest: Effort normal and breath sounds normal. No wheezing, rales or rhonchi. Abdominal: Soft, nontender, nondistended. Bowel sounds active throughout. There are no masses palpable. No hepatosplenomegaly. Extremities: no clubbing, cyanosis, or edema Neurological: Alert and oriented to person place and time. Skin: Skin is warm and dry. Psychiatric: Normal mood and affect. Behavior is normal.      Assessment & Plan:  71 year old female with ileocolonic Crohn's disease is here for follow-up.   1. Ileocolonic Crohn's disease -- remains in clinical remission. We'll continue Pentasa 1 g 3 times a day. She has been steroid free for nearly 2 years. Surveillance colonoscopy is recommended in November 2019. I will see her back in 9 months and we can further discuss. She is reminded to avoid NSAIDs. She is up-to-date with influenza vaccination and Pneumovax.  2. Seizure disorder -- following neurology, Dr. Delice Lesch. On Keppra  15 minutes spent with the patient today. Greater than 50% was spent in counseling and coordination of care with the patient

## 2017-09-12 NOTE — Patient Instructions (Signed)
If you are age 73 or older, your body mass index should be between 23-30. Your Body mass index is 22.56 kg/m. If this is out of the aforementioned range listed, please consider follow up with your Primary Care Provider.  If you are age 7 or younger, your body mass index should be between 19-25. Your Body mass index is 22.56 kg/m. If this is out of the aformentioned range listed, please consider follow up with your Primary Care Provider.   We have sent the following medications to your pharmacy for you to pick up at your convenience: Pentasa  Follow up with Dr. Hilarie Fredrickson in 9 months.   Thank you for choosing me and [facility].   Zenovia Jarred, MD

## 2017-09-13 ENCOUNTER — Telehealth: Payer: Self-pay | Admitting: Neurology

## 2017-09-13 NOTE — Telephone Encounter (Signed)
Patient has called back trying to see where she is to have her MRI. She has not heard anything and [facility] had told her that they had a wrong order. Please call. Thanks

## 2017-09-13 NOTE — Telephone Encounter (Signed)
She has not heard from anyone regarding setting up her MRI. Please Advise. Thanks

## 2017-09-14 NOTE — Telephone Encounter (Signed)
Orders were corrected in EPIC on 9/26

## 2017-09-19 NOTE — Progress Notes (Signed)
HEMATOLOGY ONCOLOGY PROGRESS NOTE  Date of service: .09/20/2017   Patient Care Team: Peggyann Juba, NP as PCP - General  CC: Follow-up for thrombocytopenia /ITP  ONCOLOGIC HISTORY  T1N0 right breast cancer in 1990, on observation since completing adjuvant CMF chemotherapy and 5 years of tamoxifen. Mammograms done at [facility] 01-21-2016, report scanned in this EMR.  She also has history of IB grade 1 endometrial cancer treated with TAH, BSO, pelvic lymphadnectomy by Dr Josephina Shih  11-29-2006.  Mild thrombocytopenia present intermittently at least back to 2011.  She had CMF chemotherapy in 1990. Abdominal US done in [facility] system 12-2015 "spleen 9.9 cm, negative" and no mention of adenopathy. Platelets did not seem to increase when she was on steroids for Crohn's flare 08-2015 and did not improve with change from long term dilantin to keppra. Bone marrow exam and cytogenetics 08-25-16 normal trilineage hematopoiesis with increase in normal megakaryocytes and normal cytogenetics, suggesting ITP.   INTERVAL HISTORY:  Pt presents to the office today for f/u of her thrombocytopenia. She is accompanied by her husband. She reports that she is doing well overall. Her dose of keppra has increased and she is now taking lamictal as well since 9/7. She believes her urinary infections were triggers for her seizures, however she has not had any seizures since. Her PLT count have remained stable over the last 6 months at are currently at 102k as of today 09/20/2017 (were 106k about 6 months ago). No petechiae no other issues with abnormal bleeding or bruising. On review of systems, denies mouth sores, gum bleeds, urinary symptoms, bruising, bleeding, abdominal pain, swelling and any other accompanying symptoms.   REVIEW OF SYSTEMS:    10 Point review of systems of done and is negative except as noted above.  . Past Medical History:  Diagnosis Date  . Bowel obstruction ([place])   . Colitis     . Crohn's disease ([place])   . Epilepsy ([place])   . Malignant neoplasm of breast (female), unspecified site    right  . Malignant neoplasm of corpus uteri, except isthmus ([place])   . Osteoporosis, unspecified   . Personal history of colonic polyps    hyperplastic  . Pneumonia   . Renal cyst   . Seizure disorder ([place])   . Thrombocytopenia ([place])     . Past Surgical History:  Procedure Laterality Date  . ABDOMINAL HYSTERECTOMY    . BREAST LUMPECTOMY    . CHOLECYSTECTOMY    . COLON RESECTION    . COLONOSCOPY WITH PROPOFOL N/A 10/15/2015   Procedure: COLONOSCOPY WITH PROPOFOL;  Surgeon: Jerene Bears, MD;  Location: [facility] ENDOSCOPY;  Service: Gastroenterology;  Laterality: N/A;  . ELBOW SURGERY     left    . Social History  Substance Use Topics  . Smoking status: Never Smoker  . Smokeless tobacco: Never Used  . Alcohol use No    ALLERGIES:  is allergic to ciprofloxacin; ampicillin; penicillins; pneumococcal vac polyvalent; and tetanus toxoid.  MEDICATIONS:  Current Outpatient Prescriptions  Medication Sig Dispense Refill  . aspirin EC 81 MG tablet Take 81 mg by mouth daily.    . Calcium Carb-Cholecalciferol (CALCIUM 1000 + D PO) Take 1,000 mg by mouth 2 (two) times daily.    . Cholecalciferol (VITAMIN D) 1000 UNITS capsule Take 1,000 Units by mouth 2 (two) times daily.      . Cranberry 500 MG CAPS Take 500 mg by mouth daily.    . cyanocobalamin (,VITAMIN B-12,) 1000  MCG/ML injection Inject 1,000 mcg into the muscle every 30 (thirty) days.     Marland Kitchen lamoTRIgine (LAMICTAL) 25 MG tablet Take 1 tablet twice a day for 2 weeks, then increase to 2 tablet twice a day (Patient taking differently: 50 mg 2 (two) times daily. Take 1 tablet twice a day for 2 weeks, then increase to 2 tablet twice a day) 120 tablet 6  . levETIRAcetam (KEPPRA XR) 500 MG 24 hr tablet Take 4 tablets at bedtime 120 tablet 11  . mesalamine (PENTASA) 250 MG CR capsule Take 4 capsules (1,000 mg total) by mouth 3 (three) times  daily. 360 capsule 2  . potassium chloride SA (K-DUR,KLOR-CON) 20 MEQ tablet Take 40 mEq by mouth 2 (two) times daily.     . pravastatin (PRAVACHOL) 10 MG tablet Take 5 mg by mouth daily.    Marland Kitchen trimethoprim (TRIMPEX) 100 MG tablet Take 100 mg by mouth.     No current facility-administered medications for this visit.     PHYSICAL EXAMINATION: ECOG PERFORMANCE STATUS: 1 - Symptomatic but completely ambulatory  . Vitals:   09/20/17 1033  BP: 135/81  Pulse: 87  Resp: 18  Temp: 97.7 F (36.5 C)  SpO2: 97%    Filed Weights   09/20/17 1033  Weight: 121 lb 3.2 oz (55 kg)   .Body mass index is 22.9 kg/m.  GENERAL:alert, in no acute distress and comfortable SKIN: no acute rashes, no significant lesions EYES: conjunctiva are pink and non-injected, sclera anicteric OROPHARYNX: MMM, no exudates, no oropharyngeal erythema or ulceration NECK: supple, no JVD LYMPH:  no palpable lymphadenopathy in the cervical, axillary or inguinal regions LUNGS: clear to auscultation b/l with normal respiratory effort HEART: regular rate & rhythm ABDOMEN:  normoactive bowel sounds , non tender, not distended. Extremity: no pedal edema PSYCH: alert & oriented x 3 with fluent speech NEURO: no focal motor/sensory deficits  LABORATORY DATA:   I have reviewed the data as listed  . CBC Latest Ref Rng & Units 09/20/2017 03/21/2017 09/20/2016  WBC 3.9 - 10.3 10e3/uL 3.9 3.8(L) 3.6(L)  Hemoglobin 11.6 - 15.9 g/dL 12.2 12.5 11.7  Hematocrit 34.8 - 46.6 % 36.3 37.7 34.6(L)  Platelets 145 - 400 10e3/uL 102(L) 106(L) 95(L)    . CMP Latest Ref Rng & Units 09/20/2017 03/21/2017 07/26/2016  Glucose 70 - 140 mg/dl 122 88 138  BUN 7.0 - 26.0 mg/dL 8.2 8.6 8.3  Creatinine 0.6 - 1.1 mg/dL 1.1 1.1 1.1  Sodium 136 - 145 mEq/L 138 139 137  Potassium 3.5 - 5.1 mEq/L 4.5 4.1 3.7  Chloride 101 - 111 mmol/L - - -  CO2 22 - 29 mEq/L 22 19(L) 22  Calcium 8.4 - 10.4 mg/dL 9.9 9.4 9.2  Total Protein 6.4 - 8.3 g/dL 7.3 7.0  7.1  Total Bilirubin 0.20 - 1.20 mg/dL 0.52 0.65 0.60  Alkaline Phos 40 - 150 U/L 85 64 77  AST 5 - 34 U/L 24 25 25   ALT 0 - 55 U/L 22 24 27      RADIOGRAPHIC STUDIES: I have personally reviewed the radiological images as listed and agreed with the findings in the report. No results found.  ASSESSMENT & PLAN:   1) T1N0 right breast cancer in 1990, on observation since completing adjuvant CMF chemotherapy and 5 years of tamoxifen. Plan -Patient has no clinical evidence of breast cancer recurrence at this time.  -continue f/u with PCP with annual mammograms  2)She also has history of IB grade 1 endometrial cancer  treated with TAH, BSO, pelvic lymphadnectomy by Dr Josephina Shih  11-29-2006. Plan -Continue follow-up with GYN and primary care physician.  3) Mild thrombocytopenia present intermittently at least back to 2011.  She had CMF chemotherapy in 1990. Abdominal US done in [facility] system 12-2015 "spleen 9.9 cm, negative" and no mention of adenopathy. Platelets did not seem to increase when she was on steroids for Crohn's flare 08-2015 and did not improve with change from long term dilantin to keppra. Bone marrow exam and cytogenetics 08-25-16 normal trilineage hematopoiesis with increase in normal megakaryocytes and normal cytogenetics, suggesting ?ITP.  Plan -Currently platelet counts are 102k and a diagnosis of ITP cannot be established with platelet counts of more than 100k. 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 -Possibly immune mediated in the setting of history of Crohn's disease. Also could be related to one of her medications including the Keppra or Pentasa. These medications however important to her control of her seizure disorder and Crohn's disease and would not need to be changed unless there is a significant drop in her platelet counts. -No indication for further evaluation or treatment of her stable mild thrombocytopenia at this  time. -monitor plt count q54month with PCP  -Pt will consult if Plt count drops <75k   RTC with Dr. KIrene LimboPRN  I spent 20 minutes counseling the patient face to face. The total time spent in the appointment was 25 minutes and more than 50% was on counseling and direct patient cares.    GSullivan LoneMD MNew BedfordAAHIVMS STexas Midwest Surgery CenterCSt Joseph Hospital Mil[facility] Med CtrHematology/Oncology Physician CParadise Valley [facility] (Office):       [phone](Work cell):  [phone](Fax):           [phone]   This document serves as a record of services personally performed by GSullivan Lone MD. It was created on her behalf by LAlean Rinne a trained medical scribe. The creation of this record is based on the scribe's personal observations and the provider's statements to them. This document has been checked and approved by the attending provider.

## 2017-09-20 ENCOUNTER — Encounter: Payer: Self-pay | Admitting: Hematology

## 2017-09-20 ENCOUNTER — Other Ambulatory Visit (HOSPITAL_BASED_OUTPATIENT_CLINIC_OR_DEPARTMENT_OTHER): Payer: Medicare Other

## 2017-09-20 ENCOUNTER — Ambulatory Visit (HOSPITAL_BASED_OUTPATIENT_CLINIC_OR_DEPARTMENT_OTHER): Payer: Medicare Other | Admitting: Hematology

## 2017-09-20 VITALS — BP 135/81 | HR 87 | Temp 97.7°F | Resp 18 | Ht 61.0 in | Wt 121.2 lb

## 2017-09-20 DIAGNOSIS — Z853 Personal history of malignant neoplasm of breast: Secondary | ICD-10-CM

## 2017-09-20 DIAGNOSIS — D696 Thrombocytopenia, unspecified: Secondary | ICD-10-CM

## 2017-09-20 DIAGNOSIS — Z8542 Personal history of malignant neoplasm of other parts of uterus: Secondary | ICD-10-CM

## 2017-09-20 LAB — CBC & DIFF AND RETIC
BASO%: 0.5 % (ref 0.0–2.0)
Basophils Absolute: 0 10*3/uL (ref 0.0–0.1)
EOS ABS: 0.1 10*3/uL (ref 0.0–0.5)
EOS%: 1.8 % (ref 0.0–7.0)
HCT: 36.3 % (ref 34.8–46.6)
HEMOGLOBIN: 12.2 g/dL (ref 11.6–15.9)
IMMATURE RETIC FRACT: 7.7 % (ref 1.60–10.00)
LYMPH%: 22.8 % (ref 14.0–49.7)
MCH: 31.4 pg (ref 25.1–34.0)
MCHC: 33.6 g/dL (ref 31.5–36.0)
MCV: 93.6 fL (ref 79.5–101.0)
MONO#: 0.2 10*3/uL (ref 0.1–0.9)
MONO%: 4.6 % (ref 0.0–14.0)
NEUT#: 2.8 10*3/uL (ref 1.5–6.5)
NEUT%: 70.3 % (ref 38.4–76.8)
PLATELETS: 102 10*3/uL — AB (ref 145–400)
RBC: 3.88 10*6/uL (ref 3.70–5.45)
RDW: 13.6 % (ref 11.2–14.5)
Retic %: 1.83 % (ref 0.70–2.10)
Retic Ct Abs: 71 10*3/uL (ref 33.70–90.70)
WBC: 3.9 10*3/uL (ref 3.9–10.3)
lymph#: 0.9 10*3/uL (ref 0.9–3.3)

## 2017-09-20 LAB — COMPREHENSIVE METABOLIC PANEL
ALBUMIN: 3.9 g/dL (ref 3.5–5.0)
ALK PHOS: 85 U/L (ref 40–150)
ALT: 22 U/L (ref 0–55)
ANION GAP: 9 meq/L (ref 3–11)
AST: 24 U/L (ref 5–34)
BILIRUBIN TOTAL: 0.52 mg/dL (ref 0.20–1.20)
BUN: 8.2 mg/dL (ref 7.0–26.0)
CO2: 22 mEq/L (ref 22–29)
Calcium: 9.9 mg/dL (ref 8.4–10.4)
Chloride: 107 mEq/L (ref 98–109)
Creatinine: 1.1 mg/dL (ref 0.6–1.1)
EGFR: 51 mL/min/{1.73_m2} — AB (ref >90)
Glucose: 122 mg/dl (ref 70–140)
Potassium: 4.5 mEq/L (ref 3.5–5.1)
Sodium: 138 mEq/L (ref 136–145)
TOTAL PROTEIN: 7.3 g/dL (ref 6.4–8.3)

## 2017-09-21 ENCOUNTER — Telehealth: Payer: Self-pay | Admitting: Hematology

## 2017-09-21 NOTE — Telephone Encounter (Signed)
Per 10/10 los - RTC with Dr Irene Limbo on an as needed basis

## 2017-10-03 ENCOUNTER — Ambulatory Visit
Admission: RE | Admit: 2017-10-03 | Discharge: 2017-10-03 | Disposition: A | Discharge status: Home / Self Care | Payer: Medicare Other | Source: Ambulatory Visit | Attending: Neurology | Admitting: Neurology

## 2017-10-03 DIAGNOSIS — G40909 Epilepsy, unspecified, not intractable, without status epilepticus: Secondary | ICD-10-CM

## 2017-10-03 MED ORDER — GADOBENATE DIMEGLUMINE 529 MG/ML IV SOLN
12.0000 mL | Freq: Once | INTRAVENOUS | Status: AC | PRN
  Administered 2017-10-03: 12 mL via INTRAVENOUS

## 2017-10-04 ENCOUNTER — Telehealth: Payer: Self-pay

## 2017-10-04 NOTE — Telephone Encounter (Signed)
Spoke with pt relaying message below.  She asked about driving - I advised her that she still has to wait until she is 6 months seizure free.

## 2017-10-04 NOTE — Telephone Encounter (Signed)
-----   Message from Cameron Sprang, MD sent at 10/04/2017 11:56 AM EDT ----- Pls let her know MRI brain looks good, no evidence of tumor, stroke, or bleed. Thanks

## 2017-12-23 ENCOUNTER — Encounter: Payer: Self-pay | Admitting: Neurology

## 2017-12-23 ENCOUNTER — Ambulatory Visit (INDEPENDENT_AMBULATORY_CARE_PROVIDER_SITE_OTHER): Payer: Medicare Other | Admitting: Neurology

## 2017-12-23 DIAGNOSIS — G40309 Generalized idiopathic epilepsy and epileptic syndromes, not intractable, without status epilepticus: Secondary | ICD-10-CM | POA: Diagnosis not present

## 2017-12-23 MED ORDER — LAMOTRIGINE 25 MG PO TABS: ORAL_TABLET | ORAL | 11 refills | Status: DC

## 2017-12-23 MED ORDER — LEVETIRACETAM ER 500 MG PO TB24: ORAL_TABLET | ORAL | 11 refills | Status: DC

## 2017-12-23 NOTE — Progress Notes (Signed)
NEUROLOGY FOLLOW UP OFFICE NOTE  [name] [id number]  DOB: 1946-11-27  HISTORY OF PRESENT ILLNESS: I had the pleasure of seeing [name] in follow-up in the neurology clinic on 12/23/2017. The patient was last seen 4 months ago for nocturnal seizures. She had previously been concerned about thrombocytopenia could be related to Dilantin or slightly increased spleen size. She was tapered off Dilantin in July 2017 and had been taking Keppra XR 1069m qhs since January 2017. She had been seizure-free for 4 years, until she had 2 nocturnal convulsions in May 2018 (in the setting of UTI), then in August 2018, again while being treated for a UTI. Dose of Keppra was increased to 20059mdaily and Lamotrigine was added on in September 2018. She denies any side effects on Lamotrigine 5018mID and Keppra XR 2000m73mily. She denies any seizures or seizure-like symptoms. She is on a preventative antibiotic for UTI but had one UTI while on it. She denies any headaches, dizziness, vision changes, neck/back pain, focal numbness/tingling/weakness.   I personally reviewed MRI brain with and without contrast done 10/03/17 which did not show any acute changes, hippocampi symmetric without abnormal signal or enhancement.   HPI: This is a pleasant 71 y57RH woman with a history of Crohn's disease, uterine cancer, and breast cancer s/p lumpectomy, chemo and radiation therapy 5 years ago, who started having seizures at age 62 o4358. 53e recalls going to the bathroom then back to bed early morning, then her next recollection was being brought to the hospital. Her husband heard her scream out followed by whole body stiffening and shaking lasting 2-3 minutes, with associated tongue bite and urinary incontinence. She was brought to the hospital and not started on medication. Six months after, she had a second convulsion out of sleep with similar screaming out and body stiffening and shaking. She was again  brought to the hospital, and per patient, not started on medication. A month later, she had a third nocturnal convulsion and saw her PCP who started her on Dilantin 400mg64m. She may or may not have seen a neurologist, she is unsure, but does not recall having an EEG done. She recalls being told that she had UTIs when she had the seizures. She was seizure-free for 9 years until 2013 when she forgot to take her medication and had another nocturnal convulsion where she fractured her right arm. Dilantin dose was increased to 600mg/30m After a few months, she started having dizziness and difficulty ambulating. She went to the ER where he Dilantin level was supratherapeutic and she was instructed to stop the medication for a few days then restart at 200mg B59mShe has been doing well since with no seizures and no further side effects. She and her husband deny any staring/unresponsive episodes, no gaps in time, olfactory/gustatory hallucinations, deja vu, rising epigastric sensation, focal numbness/tingling/weakness, myoclonic jerks. She denies any dizziness, diplopia, gait instability, gum problems. She had a DEXA scan in 11/2012 and has a diagnosis of osteoporosis. She denies any headaches, dysarthria, dysphagia, neck/back pain, bowel/bladder dysfunction. No chest pain or shortness of breath. She is driving.  She was in the ER last 05/11/17 after a breakthrough nocturnal seizure. She recalls seeing her husband go to the bathroom at 5 or 6 in the morning, then has no recollection of events until waking up with EMS around her. Apparently she vocalized in bed and started having a convulsion with both arms drawn up, shaking and stiff for around 2  minutes. She was confused on EMS arrival, but back to baseline in the ER. She reports taking only 515m of Keppra the night prior because she thought she already took it. She denies any sleep deprivation. Bloodwork unremarkable, head CT no acute changes. She had an  abnormal urinalysis with small LE, WBC 9, many bacteria, moderate blood. Urine culture showed 10,000-25,000 cfu/ml with polymicrobial growth. She was discharged on antibiotics. She did not have any UTI symptoms, but states that breakthrough seizures in the past occurred with a UTI or missing a dose of medication. She was complaining of back pain, and thoracic xray showed a T2 compression fracture, likely subacute, as well as remote T3-7 compression fractures with advanced height loss. She was given a prescription for Percocet, she states Tylenol does not help. Her Keppra level was 23.1. Platelet count was 86 (121 in 02/2017).  Epilepsy Risk Factors: Her maternal aunt had seizures, otherwise she had a normal birth and early development. There is no history of febrile convulsions, CNS infections such as meningitis/encephalitis, significant traumatic brain injury, neurosurgical procedures.  Prior AEDs: Dilantin  EEG 05/01/14: occasional independent focal slowing over the bilateral temporal regions, left greater than right. Imaging: Head CT without contrast done 07/13/2013 reported as unremarkable.  PAST MEDICAL HISTORY: Past Medical History:  Diagnosis Date  . Bowel obstruction ([place]   . Colitis   . Crohn's disease ([place]   . Epilepsy ([place]   . Malignant neoplasm of breast (female), unspecified site    right  . Malignant neoplasm of corpus uteri, except isthmus ([place]   . Osteoporosis, unspecified   . Personal history of colonic polyps    hyperplastic  . Pneumonia   . Renal cyst   . Seizure disorder ([place]   . Thrombocytopenia ([place]     MEDICATIONS: Current Outpatient Medications on File Prior to Visit  Medication Sig Dispense Refill  . aspirin EC 81 MG tablet Take 81 mg by mouth daily.    . Calcium Carb-Cholecalciferol (CALCIUM 1000 + D PO) Take 1,000 mg by mouth 2 (two) times daily.    . Cholecalciferol (VITAMIN D) 1000 UNITS capsule Take 1,000 Units by mouth 2 (two) times daily.      .  Cranberry 500 MG CAPS Take 500 mg by mouth daily.    . cyanocobalamin (,VITAMIN B-12,) 1000 MCG/ML injection Inject 1,000 mcg into the muscle every 30 (thirty) days.     .Marland KitchenlamoTRIgine (LAMICTAL) 25 MG tablet Take 1 tablet twice a day for 2 weeks, then increase to 2 tablet twice a day (Patient taking differently: 50 mg 2 (two) times daily. Take 1 tablet twice a day for 2 weeks, then increase to 2 tablet twice a day) 120 tablet 6  . levETIRAcetam (KEPPRA XR) 500 MG 24 hr tablet Take 4 tablets at bedtime 120 tablet 11  . mesalamine (PENTASA) 250 MG CR capsule Take 4 capsules (1,000 mg total) by mouth 3 (three) times daily. 360 capsule 2  . potassium chloride SA (K-DUR,KLOR-CON) 20 MEQ tablet Take 40 mEq by mouth 2 (two) times daily.     . pravastatin (PRAVACHOL) 10 MG tablet Take 5 mg by mouth daily.     No current facility-administered medications on file prior to visit.     ALLERGIES: Allergies  Allergen Reactions  . Ciprofloxacin Other (See Comments)    Seizures  . Ampicillin Diarrhea  . Penicillins Diarrhea    Has patient had a PCN reaction causing immediate rash, facial/tongue/throat swelling, SOB or lightheadedness with  hypotension: No Has patient had a PCN reaction causing severe rash involving mucus membranes or skin necrosis: No Has patient had a PCN reaction that required hospitalization No Has patient had a PCN reaction occurring within the last 10 years: No If all of the above answers are "NO", then may proceed with Cephalosporin use.  . Pneumococcal Vac Polyvalent Other (See Comments)    Local swelling  . Tetanus Toxoid Swelling    Arm swelling    FAMILY HISTORY: Family History  Problem Relation Age of Onset  . Heart disease Mother   . Aneurysm Father   . Breast cancer Maternal Aunt   . Colon cancer Neg Hx     SOCIAL HISTORY: Social History   Socioeconomic History  . Marital status: Married    Spouse name: Not on file  . Number of children: 2  . Years of  education: Not on file  . Highest education level: Not on file  Social Needs  . Financial resource strain: Not on file  . Food insecurity - worry: Not on file  . Food insecurity - inability: Not on file  . Transportation needs - medical: Not on file  . Transportation needs - non-medical: Not on file  Occupational History  . Occupation: Retired    Fish farm manager: RETIRED  Tobacco Use  . Smoking status: Never Smoker  . Smokeless tobacco: Never Used  Substance and Sexual Activity  . Alcohol use: No  . Drug use: No  . Sexual activity: Yes    Birth control/protection: None  Other Topics Concern  . Not on file  Social History Narrative   Pt lives at home with her husband in a one story house.    REVIEW OF SYSTEMS: Constitutional: No fevers, chills, or sweats, no generalized fatigue, change in appetite Eyes: No visual changes, double vision, eye pain Ear, nose and throat: No hearing loss, ear pain, nasal congestion, sore throat Cardiovascular: No chest pain, palpitations Respiratory:  No shortness of breath at rest or with exertion, wheezes GastrointestinaI: No nausea, vomiting, diarrhea, abdominal pain, fecal incontinence Genitourinary:  No dysuria, urinary retention or frequency Musculoskeletal:  No neck pain, back pain Integumentary: No rash, pruritus, skin lesions Neurological: as above Psychiatric: No depression, insomnia, anxiety Endocrine: No palpitations, fatigue, diaphoresis, mood swings, change in appetite, change in weight, increased thirst Hematologic/Lymphatic:  No anemia, purpura, petechiae. Allergic/Immunologic: no itchy/runny eyes, nasal congestion, recent allergic reactions, rashes  PHYSICAL EXAM: Vitals:   12/23/17 1112  BP: 132/76  Pulse: 84  SpO2: 96%   General: No acute distress Head:  Normocephalic/atraumatic Neck: supple, no paraspinal tenderness, full range of motion Heart:  Regular rate and rhythm Lungs:  Clear to auscultation bilaterally Back: No  paraspinal tenderness Skin/Extremities: No rash, no edema Neurological Exam: alert and oriented to person, place, and time. No aphasia or dysarthria. Fund of knowledge is appropriate.  Recent and remote memory are intact. Attention and concentration are normal.    Able to name objects and repeat phrases. Cranial nerves: Pupils equal, round, reactive to light.  Extraocular movements intact with no nystagmus. Visual fields full. Facial sensation intact. No facial asymmetry. Tongue, uvula, palate midline.  Motor: Bulk and tone normal, muscle strength 5/5 throughout with no pronator drift.  Sensation to light touch intact.  No extinction to double simultaneous stimulation.  Deep tendon reflexes 2+ throughout, toes downgoing.  Finger to nose testing intact.  Gait slow and cautious, unable to tandem walk.  IMPRESSION: This is a pleasant 72 yo RH  woman with a history of Crohn's disease, uterine cancer, breast cancer s/p lumpectomy, chemo, and radiation therapy, with a history of convulsive seizures that occur out of sleep since age 59 or 22. MRI brain normal, EEG showed bilateral temporal slowing, left greater than right. Etiology of seizures unclear. She had been seizure-free since 2013 on Dilantin, but was concerned about thrombocytopenia potentially due to Dilantin, and was switched to Keppra monotherapy in July 2017. She had breakthrough nocturnal convulsions in May 2018 and August 2018, both in the setting of UTI for more than a week and Ciprofloxacin. She has been on Cipro before with no difficulties, but the prolonged UTI in addition to Cipro may have been the trigger for lowering seizure threshold. She is now on prophylactic treatment for recurrent UTIs. She is now on Keppra XR 2080m daily and Lamotrigine 562mBID without side effects. She is aware of [place] driving laws to stop driving after a seizure, until 6 months seizure-free. She will follow-up in 5 months and knows to call our office for any  problems.  Thank you for allowing me to participate in her care.  Please do not hesitate to call for any questions or concerns.  The duration of this appointment visit was 25 minutes of face-to-face time with the patient.  Greater than 50% of this time was spent in counseling, explanation of diagnosis, planning of further management, and coordination of care.   KaEllouise NewerM.D.   CC: DoEstrella DeedsNP

## 2017-12-23 NOTE — Patient Instructions (Signed)
Great seeing you! Continue Keppra XR 569m, take 4 tablets at night. Continue Lamictal 230m take 2 tablets twice a day. Follow-up in 5 months, call for any changes  Seizure Precautions: 1. If medication has been prescribed for you to prevent seizures, take it exactly as directed.  Do not stop taking the medicine without talking to your doctor first, even if you have not had a seizure in a long time.   2. Avoid activities in which a seizure would cause danger to yourself or to others.  Don't operate dangerous machinery, swim alone, or climb in high or dangerous places, such as on ladders, roofs, or girders.  Do not drive unless your doctor says you may.  3. If you have any warning that you may have a seizure, lay down in a safe place where you can't hurt yourself.    4.  No driving for 6 months from last seizure, as per [facility]  Please refer to the following link on the EpDrydenebsite for more information: [email or web address]   5.  Maintain good sleep hygiene. Avoid alcohol.  6.  Contact your doctor if you have any problems that may be related to the medicine you are taking.  7.  Call 911 and bring the patient back to the ED if:        A.  The seizure lasts longer than 5 minutes.       B.  The patient doesn't awaken shortly after the seizure  C.  The patient has new problems such as difficulty seeing, speaking or moving  D.  The patient was injured during the seizure  E.  The patient has a temperature over 102 F (39C)  F.  The patient vomited and now is having trouble breathing

## 2017-12-27 ENCOUNTER — Encounter: Payer: Self-pay | Admitting: Neurology

## 2018-03-23 ENCOUNTER — Other Ambulatory Visit: Payer: Self-pay | Admitting: Internal Medicine

## 2018-03-23 MED ORDER — MESALAMINE ER 250 MG PO CPCR: 1000.0000 mg | ORAL_CAPSULE | Freq: Three times a day (TID) | ORAL | 2 refills | Status: DC

## 2018-03-23 NOTE — Addendum Note (Signed)
Addended by: Larina Bras on: 03/23/2018 04:35 PM   Modules accepted: Orders

## 2018-05-04 ENCOUNTER — Encounter: Payer: Self-pay | Admitting: Internal Medicine

## 2018-06-01 ENCOUNTER — Ambulatory Visit: Payer: Medicare Other | Admitting: Internal Medicine

## 2018-06-06 ENCOUNTER — Ambulatory Visit (INDEPENDENT_AMBULATORY_CARE_PROVIDER_SITE_OTHER): Payer: Medicare Other | Admitting: Neurology

## 2018-06-06 ENCOUNTER — Encounter: Payer: Self-pay | Admitting: Neurology

## 2018-06-06 ENCOUNTER — Other Ambulatory Visit: Payer: Self-pay

## 2018-06-06 DIAGNOSIS — G40309 Generalized idiopathic epilepsy and epileptic syndromes, not intractable, without status epilepticus: Secondary | ICD-10-CM

## 2018-06-06 MED ORDER — LAMOTRIGINE 25 MG PO TABS: ORAL_TABLET | ORAL | 11 refills | Status: DC

## 2018-06-06 MED ORDER — LEVETIRACETAM ER 500 MG PO TB24: ORAL_TABLET | ORAL | 11 refills | Status: DC

## 2018-06-06 NOTE — Progress Notes (Signed)
NEUROLOGY FOLLOW UP OFFICE NOTE  [name] [id number]  DOB: [birth year]  HISTORY OF PRESENT ILLNESS: I had the pleasure of seeing [name] in follow-up in the neurology clinic on 06/06/2018. The patient was last seen 5 months ago for nocturnal seizures. She had previously been concerned about thrombocytopenia could be related to Dilantin or slightly increased spleen size. She was tapered off Dilantin in July 2017 and had been taking Keppra XR 1067m qhs since January 2017. She had been seizure-free for 4 years, until she had 2 nocturnal convulsions in May 2018 (in the setting of UTI), then in August 2018, again while being treated for a UTI. Dose of Keppra was increased to 20070mdaily and Lamotrigine was added on in September 2018. She denies any further seizures since August 2018, no side effects on Lamotrigine 5039mID and Keppra XR 2000m77mily. She denies any seizures or seizure-like symptoms, no staring/unresponsive episodes, gaps in time, olfactory/gustatory hallucinations, myoclonic jerks. She denies any headaches, dizziness, vision changes, neck/back pain, focal numbness/tingling/weakness.   HPI: This is a pleasant 71 y16RH woman with a history of Crohn's disease, uterine cancer, and breast cancer s/p lumpectomy, chemo and radiation therapy 5 years ago, who started having seizures at age 83 [id number]. 23e recalls going to the bathroom then back to bed early morning, then her next recollection was being brought to the hospital. Her husband heard her scream out followed by whole body stiffening and shaking lasting 2-3 minutes, with associated tongue bite and urinary incontinence. She was brought to the hospital and not started on medication. Six months after, she had a second convulsion out of sleep with similar screaming out and body stiffening and shaking. She was again brought to the hospital, and per patient, not started on medication. A month later, she had a third nocturnal  convulsion and saw her PCP who started her on Dilantin 400mg11m. She may or may not have seen a neurologist, she is unsure, but does not recall having an EEG done. She recalls being told that she had UTIs when she had the seizures. She was seizure-free for 9 years until 2013 when she forgot to take her medication and had another nocturnal convulsion where she fractured her right arm. Dilantin dose was increased to 600mg/70m After a few months, she started having dizziness and difficulty ambulating. She went to the ER where he Dilantin level was supratherapeutic and she was instructed to stop the medication for a few days then restart at 200mg B13mShe has been doing well since with no seizures and no further side effects. She and her husband deny any staring/unresponsive episodes, no gaps in time, olfactory/gustatory hallucinations, deja vu, rising epigastric sensation, focal numbness/tingling/weakness, myoclonic jerks. She denies any dizziness, diplopia, gait instability, gum problems. She had a DEXA scan in 11/2012 and has a diagnosis of osteoporosis. She denies any headaches, dysarthria, dysphagia, neck/back pain, bowel/bladder dysfunction. No chest pain or shortness of breath. She is driving.  She was in the ER last 05/11/17 after a breakthrough nocturnal seizure. She recalls seeing her husband go to the bathroom at 5 or 6 in the morning, then has no recollection of events until waking up with EMS around her. Apparently she vocalized in bed and started having a convulsion with both arms drawn up, shaking and stiff for around 2 minutes. She was confused on EMS arrival, but back to baseline in the ER. She reports taking only 500mg of47mpra the night prior because she  thought she already took it. She denies any sleep deprivation. Bloodwork unremarkable, head CT no acute changes. She had an abnormal urinalysis with small LE, WBC 9, many bacteria, moderate blood. Urine culture showed 10,000-25,000  cfu/ml with polymicrobial growth. She was discharged on antibiotics. She did not have any UTI symptoms, but states that breakthrough seizures in the past occurred with a UTI or missing a dose of medication. She was complaining of back pain, and thoracic xray showed a T2 compression fracture, likely subacute, as well as remote T3-7 compression fractures with advanced height loss. She was given a prescription for Percocet, she states Tylenol does not help. Her Keppra level was 23.1. Platelet count was 86 (121 in 02/2017).  Epilepsy Risk Factors: Her maternal aunt had seizures, otherwise she had a normal birth and early development. There is no history of febrile convulsions, CNS infections such as meningitis/encephalitis, significant traumatic brain injury, neurosurgical procedures.  Prior AEDs: Dilantin  EEG 05/01/14: occasional independent focal slowing over the bilateral temporal regions, left greater than right. Imaging: Head CT without contrast done 07/13/2013 reported as unremarkable. I personally reviewed MRI brain with and without contrast done 10/03/17 which did not show any acute changes, hippocampi symmetric without abnormal signal or enhancement.   PAST MEDICAL HISTORY: Past Medical History:  Diagnosis Date  . Bowel obstruction ([place])   . Colitis   . Crohn's disease ([place])   . Epilepsy ([place])   . Malignant neoplasm of breast (female), unspecified site    right  . Malignant neoplasm of corpus uteri, except isthmus ([place])   . Osteoporosis, unspecified   . Personal history of colonic polyps    hyperplastic  . Pneumonia   . Renal cyst   . Seizure disorder ([place])   . Thrombocytopenia ([place])     MEDICATIONS: Current Outpatient Medications on File Prior to Visit  Medication Sig Dispense Refill  . aspirin EC 81 MG tablet Take 81 mg by mouth daily.    . Calcium Carb-Cholecalciferol (CALCIUM 1000 + D PO) Take 1,000 mg by mouth 2 (two) times daily.    . Cholecalciferol (VITAMIN D) 1000  UNITS capsule Take 1,000 Units by mouth 2 (two) times daily.      . Cranberry 500 MG CAPS Take 500 mg by mouth daily.    . cyanocobalamin (,VITAMIN B-12,) 1000 MCG/ML injection Inject 1,000 mcg into the muscle every 30 (thirty) days.     Marland Kitchen lamoTRIgine (LAMICTAL) 25 MG tablet Take 2 tablets twice a day 120 tablet 11  . levETIRAcetam (KEPPRA XR) 500 MG 24 hr tablet Take 4 tablets at bedtime 120 tablet 11  . mesalamine (PENTASA) 250 MG CR capsule Take 4 capsules (1,000 mg total) by mouth 3 (three) times daily. 360 capsule 2  . potassium chloride SA (K-DUR,KLOR-CON) 20 MEQ tablet Take 40 mEq by mouth 2 (two) times daily.     . pravastatin (PRAVACHOL) 10 MG tablet Take 5 mg by mouth daily.     No current facility-administered medications on file prior to visit.     ALLERGIES: Allergies  Allergen Reactions  . Ciprofloxacin Other (See Comments)    Seizures  . Ampicillin Diarrhea  . Penicillins Diarrhea    Has patient had a PCN reaction causing immediate rash, facial/tongue/throat swelling, SOB or lightheadedness with hypotension: No Has patient had a PCN reaction causing severe rash involving mucus membranes or skin necrosis: No Has patient had a PCN reaction that required hospitalization No Has patient had a PCN reaction occurring within the  last 10 years: No If all of the above answers are "NO", then may proceed with Cephalosporin use.  . Pneumococcal Vac Polyvalent Other (See Comments)    Local swelling  . Tetanus Toxoid Swelling    Arm swelling    FAMILY HISTORY: Family History  Problem Relation Age of Onset  . Heart disease Mother   . Aneurysm Father   . Breast cancer Maternal Aunt   . Colon cancer Neg Hx     SOCIAL HISTORY: Social History   Socioeconomic History  . Marital status: Married    Spouse name: Not on file  . Number of children: 2  . Years of education: Not on file  . Highest education level: Not on file  Occupational History  . Occupation: Retired     Fish farm manager: RETIRED  Social Needs  . Financial resource strain: Not on file  . Food insecurity:    Worry: Not on file    Inability: Not on file  . Transportation needs:    Medical: Not on file    Non-medical: Not on file  Tobacco Use  . Smoking status: Never Smoker  . Smokeless tobacco: Never Used  Substance and Sexual Activity  . Alcohol use: No  . Drug use: No  . Sexual activity: Yes    Birth control/protection: None  Lifestyle  . Physical activity:    Days per week: Not on file    Minutes per session: Not on file  . Stress: Not on file  Relationships  . Social connections:    Talks on phone: Not on file    Gets together: Not on file    Attends religious service: Not on file    Active member of club or organization: Not on file    Attends meetings of clubs or organizations: Not on file    Relationship status: Not on file  . Intimate partner violence:    Fear of current or ex partner: Not on file    Emotionally abused: Not on file    Physically abused: Not on file    Forced sexual activity: Not on file  Other Topics Concern  . Not on file  Social History Narrative   Pt lives at home with her husband in a one story house.    REVIEW OF SYSTEMS: Constitutional: No fevers, chills, or sweats, no generalized fatigue, change in appetite Eyes: No visual changes, double vision, eye pain Ear, nose and throat: No hearing loss, ear pain, nasal congestion, sore throat Cardiovascular: No chest pain, palpitations Respiratory:  No shortness of breath at rest or with exertion, wheezes GastrointestinaI: No nausea, vomiting, diarrhea, abdominal pain, fecal incontinence Genitourinary:  No dysuria, urinary retention or frequency Musculoskeletal:  No neck pain, back pain Integumentary: No rash, pruritus, skin lesions Neurological: as above Psychiatric: No depression, insomnia, anxiety Endocrine: No palpitations, fatigue, diaphoresis, mood swings, change in appetite, change in weight,  increased thirst Hematologic/Lymphatic:  No anemia, purpura, petechiae. Allergic/Immunologic: no itchy/runny eyes, nasal congestion, recent allergic reactions, rashes  PHYSICAL EXAM: Vitals:   06/06/18 1506  BP: 122/80  Pulse: 75  SpO2: 98%   General: No acute distress Head:  Normocephalic/atraumatic Neck: supple, no paraspinal tenderness, full range of motion Heart:  Regular rate and rhythm Lungs:  Clear to auscultation bilaterally Back: No paraspinal tenderness Skin/Extremities: No rash, no edema Neurological Exam: alert and oriented to person, place, and time. No aphasia or dysarthria. Fund of knowledge is appropriate.  Recent and remote memory are intact. Attention and concentration  are normal.    Able to name objects and repeat phrases. Cranial nerves: Pupils equal, round, reactive to light.  Extraocular movements intact with no nystagmus. Visual fields full. Facial sensation intact. No facial asymmetry. Tongue, uvula, palate midline.  Motor: Bulk and tone normal, muscle strength 5/5 throughout with no pronator drift.  Sensation to light touch intact.  No extinction to double simultaneous stimulation.  Deep tendon reflexes 2+ throughout, toes downgoing.  Finger to nose testing intact.  Gait slow and cautious, unable to tandem walk.  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 MRI brain normal, EEG showed bilateral temporal slowing, left greater than right. Etiology of seizures unclear. She had been seizure-free since 2013 on Dilantin, but was concerned about thrombocytopenia potentially due to Dilantin, and was switched to Keppra monotherapy in July 2017. She had breakthrough nocturnal convulsions in May 2018 and August 2018, both in the setting of UTI for more than a week and Ciprofloxacin. No  further seizures since August 2018 with addition of low dose lamotrigine 46m BID to Keppra XR 20037mdaily. Refills sent. She is aware of [place] driving laws to stop driving after a seizure, until 6 months seizure-free. She will follow-up in 6 months and knows to call our office for any problems.  Thank you for allowing me to participate in her care.  Please do not hesitate to call for any questions or concerns.  The duration of this appointment visit was 16 minutes of face-to-face time with the patient.  Greater than 50% of this time was spent in counseling, explanation of diagnosis, planning of further management, and coordination of care.   KaEllouise NewerM.D.   CC: DoEstrella DeedsNP

## 2018-06-06 NOTE — Patient Instructions (Signed)
1. Continue all your medications 2. Follow-up in 6 months, call for any changes  Seizure Precautions: 1. If medication has been prescribed for you to prevent seizures, take it exactly as directed.  Do not stop taking the medicine without talking to your doctor first, even if you have not had a seizure in a long time.   2. Avoid activities in which a seizure would cause danger to yourself or to others.  Don't operate dangerous machinery, swim alone, or climb in high or dangerous places, such as on ladders, roofs, or girders.  Do not drive unless your doctor says you may.  3. If you have any warning that you may have a seizure, lay down in a safe place where you can't hurt yourself.    4.  No driving for 6 months from last seizure, as per [facility].   Please refer to the following link on the [place] website for more information: http://[email or web address]/[email or web address]/drivingu.cfm   5.  Maintain good sleep hygiene. Avoid alcohol.  6.  Contact your doctor if you have any problems that may be related to the medicine you are taking.  7.  Call 911 and bring the patient back to the ED if:        A.  The seizure lasts longer than 5 minutes.       B.  The patient doesn't awaken shortly after the seizure  C.  The patient has new problems such as difficulty seeing, speaking or moving  D.  The patient was injured during the seizure  E.  The patient has a temperature over 102 F (39C)  F.  The patient vomited and now is having trouble breathing

## 2018-06-08 ENCOUNTER — Encounter: Payer: Self-pay | Admitting: Internal Medicine

## 2018-06-08 ENCOUNTER — Ambulatory Visit (INDEPENDENT_AMBULATORY_CARE_PROVIDER_SITE_OTHER): Payer: Medicare Other | Admitting: Internal Medicine

## 2018-06-08 VITALS — BP 120/66 | HR 80 | Ht 62.0 in | Wt 126.0 lb

## 2018-06-08 DIAGNOSIS — K5080 Crohn's disease of both small and large intestine without complications: Secondary | ICD-10-CM

## 2018-06-08 NOTE — Patient Instructions (Addendum)
Continue Pentasa 250 mg three times daily.  You will be due for a recall colonoscopy in 10/2018. We will send you a reminder in the mail when it gets closer to that time.  Please purchase the following medications over the counter and take as directed: Lactaid tablets- Take 1-2 tablets with any milk products   Please follow up with Dr Hilarie Fredrickson in 1 year.  If you are age 74 or older, your body mass index should be between 23-30. Your Body mass index is 23.05 kg/m. If this is out of the aforementioned range listed, please consider follow up with your Primary Care Provider.  If you are age 26 or younger, your body mass index should be between 19-25. Your Body mass index is 23.05 kg/m. If this is out of the aformentioned range listed, please consider follow up with your Primary Care Provider.

## 2018-06-08 NOTE — Progress Notes (Signed)
Subjective:    Patient ID: [name], female    DOB: Dec 10, 1946, 72 y.o.   MRN: [id number]  HPI [name] is a 72 year old female with a history of ileocolonic Crohn's disease here for follow-up.  Last seen on 09/12/2017 and she presents alone today.  She reports that she has been doing well though she is noticed some increased frequency and looseness to her stools in the last few weeks.  This is not been associated with blood in her stool or melena.  No abdominal pain.  No nocturnal stools.  Stools are occurring about 4 times per day.  She has continue Pentasa 1 g 3 times daily.  Occasionally she will miss the middle of the day dose because it is harder for her to remember but most days she is taking this.  No upper GI or hepatobiliary complaint.  Good energy levels.  She has continued B12 supplementation.  She wonders if her loose stools are related to the fact that she is eating more ice cream recently as the weather is warmer and she has been helping care for her grandchildren.  Last colonoscopy was on October 15, 2015 showing mild erythema and luminal narrowing at the ileocolonic anastomosis.  There was slight edema and erythema throughout the colon.  Biopsies showed benign mucosa without dysplasia in the right colon and chronic inactive colitis in the left colon.  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     Objective:   Physical Exam BP 120/66   Pulse 80   Ht 5' 2"  (1.575 m)   Wt 126 lb (57.2 kg)   BMI 23.05 kg/m  Constitutional: Well-developed and well-nourished. No distress. HEENT: Normocephalic and atraumatic.  Conjunctivae are normal.  No scleral icterus. Neck: Neck supple. Trachea midline. Cardiovascular:  Normal rate, regular rhythm and intact distal pulses. No M/R/G Pulmonary/chest: Effort normal and breath sounds normal. No wheezing, rales or rhonchi. Abdominal: Soft, nontender, nondistended. Bowel sounds active throughout. There are no masses palpable. No hepatosplenomegaly. Extremities: no clubbing, cyanosis, or edema Neurological: Alert and oriented to person place and time. Skin: Skin is warm and dry. Psychiatric: Normal mood and affect. Behavior is normal.  CBC    Component Value Date/Time   WBC 3.9 09/20/2017 1014   WBC 4.5 08/25/2016 0918   RBC 3.88 09/20/2017 1014   RBC 4.09 08/25/2016 0918   HGB 12.2 09/20/2017 1014   HCT 36.3 09/20/2017 1014   PLT 102 (L) 09/20/2017 1014   MCV 93.6 09/20/2017 1014   MCH 31.4 09/20/2017 1014   MCH 30.6 08/25/2016 0918   MCHC 33.6 09/20/2017 1014   MCHC 33.5 08/25/2016 0918   RDW 13.6 09/20/2017 1014   LYMPHSABS 0.9 09/20/2017 1014   MONOABS 0.2 09/20/2017 1014   EOSABS 0.1 09/20/2017 1014   BASOSABS 0.0 09/20/2017 1014   CMP     Component Value Date/Time   NA 138 09/20/2017 1015   K 4.5 09/20/2017 1015   CL 112 (H) 09/04/2015 0427   CO2 22 09/20/2017 1015   GLUCOSE 122 09/20/2017 1015   BUN 8.2 09/20/2017 1015   CREATININE 1.1 09/20/2017 1015   CALCIUM 9.9 09/20/2017 1015   PROT 7.3 09/20/2017 1015   ALBUMIN 3.9 09/20/2017 1015   AST 24 09/20/2017 1015   ALT 22  09/20/2017 1015   ALKPHOS 85 09/20/2017 1015   BILITOT 0.52 09/20/2017 1015   GFRNONAA >60 09/04/2015 0427   GFRAA >60 09/04/2015 0427       Assessment & Plan:  72 year old female with a history of ileocolonic Crohn's disease here for follow-up.  1.  Ileocolonic Crohn's disease --my suspicion is that she remains in clinical remission on Pentasa.  Her loose stools are likely related to an element of lactose intolerance in the setting of eating ice cream.  I have recommended surveillance colonoscopy later this year around November 2019.  We discussed the risk,  benefits and alternatives and she is agreeable and wishes to proceed.  I would like her to continue Pentasa 1 g 3 times daily.  Add Lactaid tablets, 1 to 2 tablets, with any lactose-containing foods.  If diarrhea worsens or should she develop blood in her stool or abdominal pain she is asked to notify me.  15 minutes spent with the patient today. Greater than 50% was spent in counseling and coordination of care with the patient

## 2018-06-14 ENCOUNTER — Encounter: Payer: Self-pay | Admitting: Neurology

## 2018-07-31 ENCOUNTER — Other Ambulatory Visit: Payer: Self-pay | Admitting: Internal Medicine

## 2018-10-16 ENCOUNTER — Encounter: Payer: Self-pay | Admitting: Internal Medicine

## 2018-11-16 ENCOUNTER — Ambulatory Visit (AMBULATORY_SURGERY_CENTER): Payer: Self-pay | Admitting: *Deleted

## 2018-11-16 ENCOUNTER — Other Ambulatory Visit: Payer: Self-pay

## 2018-11-16 VITALS — Ht 59.5 in | Wt 127.6 lb

## 2018-11-16 DIAGNOSIS — Z86010 Personal history of colonic polyps: Secondary | ICD-10-CM

## 2018-11-16 DIAGNOSIS — K5080 Crohn's disease of both small and large intestine without complications: Secondary | ICD-10-CM

## 2018-11-16 MED ORDER — SUPREP BOWEL PREP KIT 17.5-3.13-1.6 GM/177ML PO SOLN
1.0000 | Freq: Once | ORAL | 0 refills | Status: AC
Start: 2018-11-16 — End: 2018-11-16

## 2018-11-16 NOTE — Progress Notes (Signed)
Patient denies any allergies to egg or soy products. Patient denies complications with anesthesia/sedation.  Patient denies oxygen use at home and denies diet medications.  Patient denies information on colonoscopy.  Patient has hx of Breast Cancer, No IV sticks, blood draws or BP reading on Right arm

## 2018-11-29 ENCOUNTER — Telehealth: Payer: Self-pay | Admitting: Internal Medicine

## 2018-11-29 NOTE — Telephone Encounter (Signed)
Called pt- got VM- Lm she can take all her meds by 830 tomorrow morning. Left number for pt to call back if she has questions  Lelan Pons PV

## 2018-11-30 ENCOUNTER — Encounter: Payer: Self-pay | Admitting: Internal Medicine

## 2018-11-30 ENCOUNTER — Ambulatory Visit (AMBULATORY_SURGERY_CENTER): Payer: Medicare Other | Admitting: Internal Medicine

## 2018-11-30 VITALS — BP 122/64 | HR 88 | Temp 97.7°F | Resp 17 | Ht 59.5 in | Wt 127.0 lb

## 2018-11-30 DIAGNOSIS — Z1211 Encounter for screening for malignant neoplasm of colon: Secondary | ICD-10-CM

## 2018-11-30 DIAGNOSIS — K5140 Inflammatory polyps of colon without complications: Secondary | ICD-10-CM | POA: Diagnosis not present

## 2018-11-30 DIAGNOSIS — D125 Benign neoplasm of sigmoid colon: Secondary | ICD-10-CM

## 2018-11-30 DIAGNOSIS — K635 Polyp of colon: Secondary | ICD-10-CM | POA: Diagnosis not present

## 2018-11-30 DIAGNOSIS — K5080 Crohn's disease of both small and large intestine without complications: Secondary | ICD-10-CM | POA: Diagnosis not present

## 2018-11-30 MED ORDER — CLOTRIMAZOLE-BETAMETHASONE 1-0.05 % EX CREA: 1.0000 "application " | TOPICAL_CREAM | Freq: Two times a day (BID) | CUTANEOUS | 0 refills | Status: DC

## 2018-11-30 MED ORDER — SODIUM CHLORIDE 0.9 % IV SOLN: 500.0000 mL | Freq: Once | INTRAVENOUS | Status: DC

## 2018-11-30 MED ORDER — CLOTRIMAZOLE 1 % EX CREA: TOPICAL_CREAM | Freq: Two times a day (BID) | CUTANEOUS | Status: DC

## 2018-11-30 NOTE — Progress Notes (Signed)
A and O x3. Report to RN. Tolerated MAC anesthesia well.

## 2018-11-30 NOTE — Progress Notes (Signed)
Called to room to assist during endoscopic procedure.  Patient ID and intended procedure confirmed with present staff. Received instructions for my participation in the procedure from the performing physician.  

## 2018-11-30 NOTE — Op Note (Signed)
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 The patient's tolerance of                            previous anesthesia was also reviewed. The risks                            and benefits of the procedure and the sedation                            options and risks were discussed with the patient.                            All questions were answered, and informed consent                            was obtained. Prior Anticoagulants: The patient has                            taken no previous anticoagulant or antiplatelet                            agents. ASA Grade Assessment: III - A patient with  severe systemic disease. After reviewing the risks                            and benefits, the patient was deemed in                            satisfactory condition to undergo the procedure.                           After obtaining informed consent, the colonoscope                            was passed under direct vision. Throughout the                            procedure, the patient's blood pressure, pulse, and                             oxygen saturations were monitored continuously. The                            Colonoscope was introduced through the anus and                            advanced to the cecum, identified by appendiceal                            orifice and ileocecal valve. The colonoscopy was                            performed without difficulty. The patient tolerated                            the procedure well. The quality of the bowel                            preparation was good. The rectum was photographed. Scope In: 11:49:32 AM Scope Out: 12:05:57 PM Scope Withdrawal Time: 0 hours 11 minutes 58 seconds  Total Procedure Duration: 0 hours 16 minutes 25 seconds  Findings:                 The perianal exam findings include a perianal                            fungal rash.                           The digital rectal exam findings include mild anal                            stricture (pediatric colonoscope passes easily).                           There was evidence of a prior end-to-end  ileo-colonic anastomosis in the ascending colon.                            This was patent and was characterized by healthy                            appearing mucosa. The anastomosis was not traversed.                           The Simple Endoscopic Score for Crohn's Disease was                            determined based on the endoscopic appearance of                            the mucosa in the following segments: Segment                            score: 0.                           - Right Colon: Findings include no ulcers present,                            no ulcerated surfaces, greater than 75% of surfaces                            affected and no narrowings. Segment score: 3.                            Mucosa slightly edematous and nodular.                           - Transverse Colon: Findings include no ulcers                            present,  no ulcerated surfaces, greater than 75% of                            surfaces affected and no narrowings. Segment score:                            3. Mucosa slightly edematous and nodular.                           - Left Colon: Findings include no ulcers present,                            no ulcerated surfaces, greater than 75% of surfaces                            affected and no narrowings. Segment score: 3.  Mucosa slightly edematous and nodular.                           - Rectum: Findings include no ulcers present, no                            ulcerated surfaces, no affected surfaces and no                            narrowings. Segment score: 0.                           - Total SES-CD aggregate score: 9. Four biopsies                            were taken every 10 cm with a cold forceps from the                            entire colon for Crohn's disease surveillance and                            dysplasia surveillance. These biopsy specimens from                            the right colon and left colon were sent to                            Pathology.                           A 10 mm polyp was found in the sigmoid colon. The                            polyp was pedunculated. The polyp was removed with                            a hot snare. Resection and retrieval were complete.                           Scattered small and large-mouthed diverticula were                            found in the sigmoid colon and descending colon.                           The retroflexed view of the distal rectum and anal                            verge was normal and showed no anal or rectal                            abnormalities. Complications:            No immediate complications. Estimated Blood Loss:  Estimated blood loss was minimal. Impression:               - Perianal fungal rash found on perianal exam.                           - Anal stricture  found on digital rectal exam.                           - Patent end-to-end ileo-colonic anastomosis,                            characterized by healthy appearing mucosa.                           - Simple Endoscopic Score for Crohn's Disease: 9,                            mucosal inflammatory changes secondary to quiescent                            Crohn's disease. Biopsied.                           - One 10 mm polyp in the sigmoid colon, removed                            with a hot snare. Resected and retrieved.                           - Diverticulosis in the sigmoid colon and in the                            descending colon.                           - The distal rectum and anal verge are normal on                            retroflexion view. Recommendation:           - Patient has a contact number available for                            emergencies. The signs and symptoms of potential                            delayed complications were discussed with the                            patient. Return to normal activities tomorrow.                            Written discharge instructions were provided to the  patient.                           - Resume previous diet.                           - Continue present medications.                           - Await pathology results.                           - Repeat colonoscopy is recommended for                            surveillance. The colonoscopy date will be                            determined after pathology results from today's                            exam become available for review.                           - Avoid NSAIDs.                           - Return to GI clinic in 6 months.                           - Clotrimazole twice daily to perianal skin for                            fungal rash. Jerene Bears, MD 11/30/2018 12:15:40 PM This report has been signed electronically.

## 2018-11-30 NOTE — Patient Instructions (Signed)
Handout given on polyps. Avoid NSAIDS. Clotrimazole twice daily to perianal skin for fungal rash.   YOU HAD AN ENDOSCOPIC PROCEDURE TODAY AT [facility]:   Refer to the procedure report that was given to you for any specific questions about what was found during the examination.  If the procedure report does not answer your questions, please call your gastroenterologist to clarify.  If you requested that your care partner not be given the details of your procedure findings, then the procedure report has been included in a sealed envelope for you to review at your convenience later.  YOU SHOULD EXPECT: Some feelings of bloating in the abdomen. Passage of more gas than usual.  Walking can help get rid of the air that was put into your GI tract during the procedure and reduce the bloating. If you had a lower endoscopy (such as a colonoscopy or flexible sigmoidoscopy) you may notice spotting of blood in your stool or on the toilet paper. If you underwent a bowel prep for your procedure, you may not have a normal bowel movement for a few days.  Please Note:  You might notice some irritation and congestion in your nose or some drainage.  This is from the oxygen used during your procedure.  There is no need for concern and it should clear up in a day or so.  SYMPTOMS TO REPORT IMMEDIATELY:   Following lower endoscopy (colonoscopy or flexible sigmoidoscopy):  Excessive amounts of blood in the stool  Significant tenderness or worsening of abdominal pains  Swelling of the abdomen that is new, acute  Fever of 100F or higher   For urgent or emergent issues, a gastroenterologist can be reached at any hour by calling [phone].   DIET:  We do recommend a small meal at first, but then you may proceed to your regular diet.  Drink plenty of fluids but you should avoid alcoholic beverages for 24 hours.  ACTIVITY:  You should plan to take it easy for the rest of today and you should NOT  DRIVE or use heavy machinery until tomorrow (because of the sedation medicines used during the test).    FOLLOW UP: Our staff will call the number listed on your records the next business day following your procedure to check on you and address any questions or concerns that you may have regarding the information given to you following your procedure. If we do not reach you, we will leave a message.  However, if you are feeling well and you are not experiencing any problems, there is no need to return our call.  We will assume that you have returned to your regular daily activities without incident.  If any biopsies were taken you will be contacted by phone or by letter within the next 1-3 weeks.  Please call us at [phone] if you have not heard about the biopsies in 3 weeks.    SIGNATURES/CONFIDENTIALITY: You and/or your care partner have signed paperwork which will be entered into your electronic medical record.  These signatures attest to the fact that that the information above on your After Visit Summary has been reviewed and is understood.  Full responsibility of the confidentiality of this discharge information lies with you and/or your care-partner.

## 2018-12-01 ENCOUNTER — Telehealth: Payer: Self-pay

## 2018-12-01 NOTE — Telephone Encounter (Signed)
  Follow up Call-  Call back number 11/30/2018  Post procedure Call Back phone  # [phone]  Permission to leave phone message Yes  Some recent data might be hidden     Patient questions:  Do you have a fever, pain , or abdominal swelling? No. Pain Score  0 *  Have you tolerated food without any problems? Yes.    Have you been able to return to your normal activities? Yes.    Do you have any questions about your discharge instructions: Diet   No. Medications  No. Follow up visit  No.  Do you have questions or concerns about your Care? No.  Actions: * If pain score is 4 or above: No action needed, pain <4.

## 2018-12-07 ENCOUNTER — Encounter: Payer: Self-pay | Admitting: Internal Medicine

## 2018-12-11 ENCOUNTER — Telehealth: Payer: Self-pay | Admitting: Internal Medicine

## 2018-12-11 ENCOUNTER — Other Ambulatory Visit: Payer: Self-pay | Admitting: Internal Medicine

## 2018-12-11 NOTE — Telephone Encounter (Signed)
Rx was sent  

## 2019-01-22 ENCOUNTER — Other Ambulatory Visit: Payer: Self-pay | Admitting: Internal Medicine

## 2019-01-23 ENCOUNTER — Other Ambulatory Visit: Payer: Self-pay

## 2019-01-23 ENCOUNTER — Ambulatory Visit: Payer: Medicare Other | Admitting: Neurology

## 2019-01-23 ENCOUNTER — Encounter: Payer: Self-pay | Admitting: Neurology

## 2019-01-23 DIAGNOSIS — G40309 Generalized idiopathic epilepsy and epileptic syndromes, not intractable, without status epilepticus: Secondary | ICD-10-CM

## 2019-01-23 MED ORDER — LAMOTRIGINE 25 MG PO TABS: ORAL_TABLET | ORAL | 3 refills | Status: DC

## 2019-01-23 MED ORDER — LEVETIRACETAM ER 500 MG PO TB24: ORAL_TABLET | ORAL | 3 refills | Status: DC

## 2019-01-23 NOTE — Progress Notes (Signed)
NEUROLOGY FOLLOW UP OFFICE NOTE  [name] [id number]  DOB: 03-01-1946  HISTORY OF PRESENT ILLNESS: I had the pleasure of seeing [name] in follow-up in the neurology clinic on 01/23/2019. The patient was last seen 7 months ago for nocturnal seizures. She had been doing well seizure-free for 4 years ntil she had 2 nocturnal convulsions in May 2018, then in August 2018, both in the setting of a UTI. Dose of Keppra XR was increased to 2052m daily and Lamotrigine 539mBID was added in September 2018, with no further seizures since August 2018. She denies any staring/unresponsive episodes, gaps in time, olfactory/gustatory hallucinations, focal numbness/tingling/weakness, myoclonic jerks. No headaches, dizziness, vision changes, no falls. No side effects on medications.   HPI: This is a pleasant 89o RH woman with a history of Crohn's disease, uterine cancer, and breast cancer s/p lumpectomy, chemo and radiation therapy 5 years ago, who started having seizures at age 89r [id number] recalls going to the bathroom then back to bed early morning, then her next recollection was being brought to the hospital. Her husband heard her scream out followed by whole body stiffening and shaking lasting 2-3 minutes, with associated tongue bite and urinary incontinence. She was brought to the hospital and not started on medication. Six months after, she had a second convulsion out of sleep with similar screaming out and body stiffening and shaking. She was again brought to the hospital, and per patient, not started on medication. A month later, she had a third nocturnal convulsion and saw her PCP who started her on Dilantin 40028may. She may or may not have seen a neurologist, she is unsure, but does not recall having an EEG done. She recalls being told that she had UTIs when she had the seizures. She was seizure-free for 9 years until 2013 when she forgot to take her medication and had another  nocturnal convulsion where she fractured her right arm. Dilantin dose was increased to 600m51my. After a few months, she started having dizziness and difficulty ambulating. She went to the ER where he Dilantin level was supratherapeutic and she was instructed to stop the medication for a few days then restart at 200mg67m. She has been doing well since with no seizures and no further side effects. She and her husband deny any staring/unresponsive episodes, no gaps in time, olfactory/gustatory hallucinations, deja vu, rising epigastric sensation, focal numbness/tingling/weakness, myoclonic jerks. She denies any dizziness, diplopia, gait instability, gum problems. She had a DEXA scan in 11/2012 and has a diagnosis of osteoporosis. She denies any headaches, dysarthria, dysphagia, neck/back pain, bowel/bladder dysfunction. No chest pain or shortness of breath. She is driving.  She was in the ER last 05/11/17 after a breakthrough nocturnal seizure. She recalls seeing her husband go to the bathroom at 5 or 6 in the morning, then has no recollection of events until waking up with EMS around her. Apparently she vocalized in bed and started having a convulsion with both arms drawn up, shaking and stiff for around 2 minutes. She was confused on EMS arrival, but back to baseline in the ER. She reports taking only 500mg 58meppra the night prior because she thought she already took it. She denies any sleep deprivation. Bloodwork unremarkable, head CT no acute changes. She had an abnormal urinalysis with small LE, WBC 9, many bacteria, moderate blood. Urine culture showed 10,000-25,000 cfu/ml with polymicrobial growth. She was discharged on antibiotics. She did not have any UTI symptoms, but  states that breakthrough seizures in the past occurred with a UTI or missing a dose of medication. She was complaining of back pain, and thoracic xray showed a T2 compression fracture, likely subacute, as well as remote T3-7  compression fractures with advanced height loss. She was given a prescription for Percocet, she states Tylenol does not help. Her Keppra level was 23.1. Platelet count was 86 (121 in 02/2017).  Epilepsy Risk Factors: Her maternal aunt had seizures, otherwise she had a normal birth and early development. There is no history of febrile convulsions, CNS infections such as meningitis/encephalitis, significant traumatic brain injury, neurosurgical procedures.  Prior AEDs: Dilantin  EEG 05/01/14: occasional independent focal slowing over the bilateral temporal regions, left greater than right. Imaging: Head CT without contrast done 07/13/2013 reported as unremarkable. I personally reviewed MRI brain with and without contrast done 10/03/17 which did not show any acute changes, hippocampi symmetric without abnormal signal or enhancement.   PAST MEDICAL HISTORY: Past Medical History:  Diagnosis Date  . Bowel obstruction ([place])   . Cataract    surgically removed  . Colitis   . Crohn's disease ([place])   . Epilepsy ([place])   . Hyperlipidemia   . Malignant neoplasm of breast (female), unspecified site    right - do not use right arm for BP, IV or blood draws  . Malignant neoplasm of corpus uteri, except isthmus ([place])   . Osteoporosis, unspecified   . Personal history of colonic polyps    hyperplastic  . Pneumonia   . Renal cyst   . Seizure disorder ([place])    last seizure was 07/2017, controlled with meds  . SVD (spontaneous vaginal delivery)    x 2  . Thrombocytopenia ([place])     MEDICATIONS: Current Outpatient Medications on File Prior to Visit  Medication Sig Dispense Refill  . acetaminophen (TYLENOL) 500 MG tablet Take by mouth.    Marland Kitchen aspirin EC 81 MG tablet Take 81 mg by mouth daily.    . Calcium Carb-Cholecalciferol (CALCIUM 1000 + D PO) Take 1,000 mg by mouth 2 (two) times daily.    . Cholecalciferol (VITAMIN D) 1000 UNITS capsule Take 1,000 Units by mouth 2 (two) times daily.      .  clotrimazole-betamethasone (LOTRISONE) cream Apply 1 application topically 2 (two) times daily. Apply to perianal rash twice daily 30 g 0  . Cranberry 500 MG CAPS Take 500 mg by mouth daily.    . cyanocobalamin (,VITAMIN B-12,) 1000 MCG/ML injection Inject 1,000 mcg into the muscle every 30 (thirty) days.     Marland Kitchen lamoTRIgine (LAMICTAL) 25 MG tablet Take 2 tablets twice a day 120 tablet 11  . levETIRAcetam (KEPPRA XR) 500 MG 24 hr tablet Take 4 tablets at bedtime 120 tablet 11  . PENTASA 250 MG CR capsule TAKE 4 CAPSULES BY MOUTH THREE TIMES DAILY 360 capsule 0  . potassium chloride SA (K-DUR,KLOR-CON) 20 MEQ tablet Take 40 mEq by mouth 2 (two) times daily.     . pravastatin (PRAVACHOL) 10 MG tablet Take 5 mg by mouth daily.     No current facility-administered medications on file prior to visit.     ALLERGIES: Allergies  Allergen Reactions  . Ciprofloxacin Other (See Comments)    Seizures  . Ampicillin Diarrhea  . Penicillins Diarrhea    Has patient had a PCN reaction causing immediate rash, facial/tongue/throat swelling, SOB or lightheadedness with hypotension: No Has patient had a PCN reaction causing severe rash involving mucus membranes or skin  necrosis: No Has patient had a PCN reaction that required hospitalization No Has patient had a PCN reaction occurring within the last 10 years: No If all of the above answers are "NO", then may proceed with Cephalosporin use.  . Pneumococcal Vac Polyvalent Other (See Comments)    Local swelling  . Tetanus Toxoid Swelling    Arm swelling    FAMILY HISTORY: Family History  Problem Relation Age of Onset  . Heart disease Mother   . Aneurysm Father   . Breast cancer Maternal Aunt   . Colon cancer Neg Hx   . Rectal cancer Neg Hx   . Stomach cancer Neg Hx     SOCIAL HISTORY: Social History   Socioeconomic History  . Marital status: Married    Spouse name: Not on file  . Number of children: 2  . Years of education: Not on file  .  Highest education level: Not on file  Occupational History  . Occupation: Retired    Fish farm manager: RETIRED  Social Needs  . Financial resource strain: Not on file  . Food insecurity:    Worry: Not on file    Inability: Not on file  . Transportation needs:    Medical: Not on file    Non-medical: Not on file  Tobacco Use  . Smoking status: Never Smoker  . Smokeless tobacco: Never Used  Substance and Sexual Activity  . Alcohol use: No  . Drug use: No  . Sexual activity: Yes    Birth control/protection: Post-menopausal  Lifestyle  . Physical activity:    Days per week: Not on file    Minutes per session: Not on file  . Stress: Not on file  Relationships  . Social connections:    Talks on phone: Not on file    Gets together: Not on file    Attends religious service: Not on file    Active member of club or organization: Not on file    Attends meetings of clubs or organizations: Not on file    Relationship status: Not on file  . Intimate partner violence:    Fear of current or ex partner: Not on file    Emotionally abused: Not on file    Physically abused: Not on file    Forced sexual activity: Not on file  Other Topics Concern  . Not on file  Social History Narrative   Pt lives at home with her husband in a one story house.    REVIEW OF SYSTEMS: Constitutional: No fevers, chills, or sweats, no generalized fatigue, change in appetite Eyes: No visual changes, double vision, eye pain Ear, nose and throat: No hearing loss, ear pain, nasal congestion, sore throat Cardiovascular: No chest pain, palpitations Respiratory:  No shortness of breath at rest or with exertion, wheezes GastrointestinaI: No nausea, vomiting, diarrhea, abdominal pain, fecal incontinence Genitourinary:  No dysuria, urinary retention or frequency Musculoskeletal:  No neck pain, back pain Integumentary: No rash, pruritus, skin lesions Neurological: as above Psychiatric: No depression, insomnia,  anxiety Endocrine: No palpitations, fatigue, diaphoresis, mood swings, change in appetite, change in weight, increased thirst Hematologic/Lymphatic:  No anemia, purpura, petechiae. Allergic/Immunologic: no itchy/runny eyes, nasal congestion, recent allergic reactions, rashes  PHYSICAL EXAM: Vitals:   01/23/19 1515  BP: 118/82  Pulse: 79  SpO2: 94%   General: No acute distress Head:  Normocephalic/atraumatic Neck: supple, no paraspinal tenderness, full range of motion Heart:  Regular rate and rhythm Lungs:  Clear to auscultation bilaterally Back: No paraspinal  tenderness Skin/Extremities: No rash, no edema Neurological Exam: alert and oriented to person, place, and time. No aphasia or dysarthria. Fund of knowledge is appropriate.  Recent and remote memory are intact. 3/3 delayed recall. Attention and concentration are normal.    Able to name objects and repeat phrases. Cranial nerves: Pupils equal, round, reactive to light.  Extraocular movements intact with no nystagmus. Visual fields full. Facial sensation intact. No facial asymmetry. Tongue, uvula, palate midline.  Motor: Bulk and tone normal, muscle strength 5/5 throughout with no pronator drift.  Sensation to light touch intact.  No extinction to double simultaneous stimulation.  Deep tendon reflexes +2 on right UE and LE, +1 on left UE and LE.  Finger to nose testing intact.  Gait narrow-based and steady, difficulty with tandem walk.  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 MRI brain normal, EEG showed bilateral temporal slowing, left greater than right. Etiology of seizures unclear. She had been seizure-free for 4 years until breakthrough seizures in 2018 in the setting of a UTI. Lamotrigine added to Levetiracetam. She is taking Levetiracetam  ER 2061m daily and Lamotrigine 581mBID without seizures since August 2018, no side effects. She is aware of [place] driving laws to stop driving after a seizure, until 6 months seizure-free. She will follow-up in 1 year and knows to call our office for any problems.  Thank you for allowing me to participate in her care.  Please do not hesitate to call for any questions or concerns.  The duration of this appointment visit was 15 minutes of face-to-face time with the patient.  Greater than 50% of this time was spent in counseling, explanation of diagnosis, planning of further management, and coordination of care.   KaEllouise NewerM.D.   CC: DoEstrella DeedsNP

## 2019-01-23 NOTE — Patient Instructions (Signed)
1. Continue all your medications 2. Follow-up in 1 year, call for any changes  Seizure Precautions: 1. If medication has been prescribed for you to prevent seizures, take it exactly as directed.  Do not stop taking the medicine without talking to your doctor first, even if you have not had a seizure in a long time.   2. Avoid activities in which a seizure would cause danger to yourself or to others.  Don't operate dangerous machinery, swim alone, or climb in high or dangerous places, such as on ladders, roofs, or girders.  Do not drive unless your doctor says you may.  3. If you have any warning that you may have a seizure, lay down in a safe place where you can't hurt yourself.    4.  No driving for 6 months from last seizure, as per [facility].   Please refer to the following link on the [place] website for more information: http://www.epilepsyfoundation.org/answerplace/Social/driving/drivingu.cfm   5.  Maintain good sleep hygiene. Avoid alcohol.  6.  Contact your doctor if you have any problems that may be related to the medicine you are taking.  7.  Call 911 and bring the patient back to the ED if:        A.  The seizure lasts longer than 5 minutes.       B.  The patient doesn't awaken shortly after the seizure  C.  The patient has new problems such as difficulty seeing, speaking or moving  D.  The patient was injured during the seizure  E.  The patient has a temperature over 102 F (39C)  F.  The patient vomited and now is having trouble breathing

## 2019-02-12 ENCOUNTER — Ambulatory Visit (HOSPITAL_COMMUNITY)
Admission: RE | Admit: 2019-02-12 | Discharge: 2019-02-12 | Disposition: A | Discharge status: Home / Self Care | Payer: Medicare Other | Source: Ambulatory Visit | Attending: Internal Medicine | Admitting: Internal Medicine

## 2019-02-12 ENCOUNTER — Encounter (HOSPITAL_COMMUNITY): Payer: Self-pay

## 2019-02-12 DIAGNOSIS — M810 Age-related osteoporosis without current pathological fracture: Secondary | ICD-10-CM | POA: Diagnosis not present

## 2019-02-12 MED ORDER — ZOLEDRONIC ACID 5 MG/100ML IV SOLN
5.0000 mg | Freq: Once | INTRAVENOUS | Status: AC
  Administered 2019-02-12: 5 mg via INTRAVENOUS
  Filled 2019-02-12: qty 100

## 2019-02-12 MED ORDER — SODIUM CHLORIDE 0.9 % IV SOLN
Freq: Once | INTRAVENOUS | Status: AC
  Administered 2019-02-12: 13:00:00 via INTRAVENOUS

## 2019-02-12 NOTE — Progress Notes (Signed)
Reclast infusion complete. Pt. Tolerated well. Reclast information sheet given to pt. Patient D/C to lobby with husband- ambulatory

## 2019-02-12 NOTE — Discharge Instructions (Signed)
Zoledronic Acid injection (Paget's Disease, Osteoporosis) What is this medicine? ZOLEDRONIC ACID (ZOE le dron ik AS id) lowers the amount of calcium loss from bone. It is used to treat Paget's disease and osteoporosis in women. This medicine may be used for other purposes; ask your health care provider or pharmacist if you have questions. COMMON BRAND NAME(S): Reclast, Zometa What should I tell my health care provider before I take this medicine? They need to know if you have any of these conditions: -aspirin-sensitive asthma -cancer, especially if you are receiving medicines used to treat cancer -dental disease or wear dentures -infection -kidney disease -low levels of calcium in the blood -past surgery on the parathyroid gland or intestines -receiving corticosteroids like dexamethasone or prednisone -an unusual or allergic reaction to zoledronic acid, other medicines, foods, dyes, or preservatives -pregnant or trying to get pregnant -breast-feeding How should I use this medicine? This medicine is for infusion into a vein. It is given by a health care professional in a hospital or clinic setting. Talk to your pediatrician regarding the use of this medicine in children. This medicine is not approved for use in children. Overdosage: If you think you have taken too much of this medicine contact a poison control center or emergency room at once. NOTE: This medicine is only for you. Do not share this medicine with others. What if I miss a dose? It is important not to miss your dose. Call your doctor or health care professional if you are unable to keep an appointment. What may interact with this medicine? -certain antibiotics given by injection -NSAIDs, medicines for pain and inflammation, like ibuprofen or naproxen -some diuretics like bumetanide, furosemide -teriparatide This list may not describe all possible interactions. Give your health care provider a list of all the medicines,  herbs, non-prescription drugs, or dietary supplements you use. Also tell them if you smoke, drink alcohol, or use illegal drugs. Some items may interact with your medicine. What should I watch for while using this medicine? Visit your doctor or health care professional for regular checkups. It may be some time before you see the benefit from this medicine. Do not stop taking your medicine unless your doctor tells you to. Your doctor may order blood tests or other tests to see how you are doing. Women should inform their doctor if they wish to become pregnant or think they might be pregnant. There is a potential for serious side effects to an unborn child. Talk to your health care professional or pharmacist for more information. You should make sure that you get enough calcium and vitamin D while you are taking this medicine. Discuss the foods you eat and the vitamins you take with your health care professional. Some people who take this medicine have severe bone, joint, and/or muscle pain. This medicine may also increase your risk for jaw problems or a broken thigh bone. Tell your doctor right away if you have severe pain in your jaw, bones, joints, or muscles. Tell your doctor if you have any pain that does not go away or that gets worse. Tell your dentist and dental surgeon that you are taking this medicine. You should not have major dental surgery while on this medicine. See your dentist to have a dental exam and fix any dental problems before starting this medicine. Take good care of your teeth while on this medicine. Make sure you see your dentist for regular follow-up appointments. What side effects may I notice from receiving this medicine?  Side effects that you should report to your doctor or health care professional as soon as possible: -allergic reactions like skin rash, itching or hives, swelling of the face, lips, or tongue -anxiety, confusion, or depression -breathing problems -changes in  vision -eye pain -feeling faint or lightheaded, falls -jaw pain, especially after dental work -mouth sores -muscle cramps, stiffness, or weakness -redness, blistering, peeling or loosening of the skin, including inside the mouth -trouble passing urine or change in the amount of urine Side effects that usually do not require medical attention (report to your doctor or health care professional if they continue or are bothersome): -bone, joint, or muscle pain -constipation -diarrhea -fever -hair loss -irritation at site where injected -loss of appetite -nausea, vomiting -stomach upset -trouble sleeping -trouble swallowing -weak or tired This list may not describe all possible side effects. Call your doctor for medical advice about side effects. You may report side effects to FDA at 1-800-FDA-1088. Where should I keep my medicine? This drug is given in a hospital or clinic and will not be stored at home. NOTE: This sheet is a summary. It may not cover all possible information. If you have questions about this medicine, talk to your doctor, pharmacist, or health care provider.  2019 Elsevier/Gold Standard (2014-04-27 14:19:57)

## 2019-02-22 ENCOUNTER — Telehealth: Payer: Self-pay | Admitting: Internal Medicine

## 2019-02-22 MED ORDER — MESALAMINE ER 250 MG PO CPCR: ORAL_CAPSULE | ORAL | 2 refills | Status: DC

## 2019-02-22 NOTE — Telephone Encounter (Signed)
Actually, patient is okay for refills until 05/2019 per last procedure note (11/2019). We had meant to give a 90 day script but sent 30 day script for her in error. I spoken to patient, cancelled current appointment for 02/2019 and she will call first week of May for late May/early June appointment. I advised that should she be told this schedule is full, she may ask for me and I will get her in. In addition, I have sent refill to her pharmacy.

## 2019-02-22 NOTE — Telephone Encounter (Signed)
Pt is scheduled for OV on 3.24.20 and requested a refill for Pentasa or enough to last until her appt.

## 2019-03-06 ENCOUNTER — Ambulatory Visit: Payer: Medicare Other | Admitting: Internal Medicine

## 2019-07-04 MED ORDER — PENTASA 250 MG PO CPCR: ORAL_CAPSULE | ORAL | 0 refills | Status: DC

## 2019-07-04 NOTE — Addendum Note (Signed)
Addended by: Larina Bras on: 07/04/2019 01:04 PM   Modules accepted: Orders

## 2019-07-04 NOTE — Telephone Encounter (Signed)
Patient needs a refill for her medication but needs an OV. I have scheduled the patient wit Nicoletta Ba 07/30/19 @ 9am but will run out of medication before her appt. She would like to get a refill if possible.

## 2019-07-04 NOTE — Telephone Encounter (Signed)
Rx sent 

## 2019-07-30 ENCOUNTER — Ambulatory Visit (INDEPENDENT_AMBULATORY_CARE_PROVIDER_SITE_OTHER): Payer: Medicare Other | Admitting: Physician Assistant

## 2019-07-30 ENCOUNTER — Encounter: Payer: Self-pay | Admitting: Physician Assistant

## 2019-07-30 ENCOUNTER — Other Ambulatory Visit (INDEPENDENT_AMBULATORY_CARE_PROVIDER_SITE_OTHER): Payer: Medicare Other

## 2019-07-30 VITALS — BP 110/70 | HR 77 | Ht 62.0 in | Wt 124.0 lb

## 2019-07-30 DIAGNOSIS — K50119 Crohn's disease of large intestine with unspecified complications: Secondary | ICD-10-CM | POA: Diagnosis not present

## 2019-07-30 LAB — COMPREHENSIVE METABOLIC PANEL
ALT: 16 U/L (ref 0–35)
AST: 18 U/L (ref 0–37)
Albumin: 4.5 g/dL (ref 3.5–5.2)
Alkaline Phosphatase: 68 U/L (ref 39–117)
BUN: 8 mg/dL (ref 6–23)
CO2: 21 mEq/L (ref 19–32)
Calcium: 9.6 mg/dL (ref 8.4–10.5)
Chloride: 106 mEq/L (ref 96–112)
Creatinine, Ser: 1.01 mg/dL (ref 0.40–1.20)
GFR: 53.42 mL/min — ABNORMAL LOW (ref >60.00)
Glucose, Bld: 96 mg/dL (ref 70–99)
Potassium: 4.2 mEq/L (ref 3.5–5.1)
Sodium: 136 mEq/L (ref 135–145)
Total Bilirubin: 0.5 mg/dL (ref 0.2–1.2)
Total Protein: 7.5 g/dL (ref 6.0–8.3)

## 2019-07-30 LAB — CBC WITH DIFFERENTIAL/PLATELET
Basophils Absolute: 0 10*3/uL (ref 0.0–0.1)
Basophils Relative: 0.9 % (ref 0.0–3.0)
Eosinophils Absolute: 0.1 10*3/uL (ref 0.0–0.7)
Eosinophils Relative: 1.6 % (ref 0.0–5.0)
HCT: 39.7 % (ref 36.0–46.0)
Hemoglobin: 13.6 g/dL (ref 12.0–15.0)
Lymphocytes Relative: 28.5 % (ref 12.0–46.0)
Lymphs Abs: 1.3 10*3/uL (ref 0.7–4.0)
MCHC: 34.2 g/dL (ref 30.0–36.0)
MCV: 94.7 fl (ref 78.0–100.0)
Monocytes Absolute: 0.2 10*3/uL (ref 0.1–1.0)
Monocytes Relative: 4.2 % (ref 3.0–12.0)
Neutro Abs: 3 10*3/uL (ref 1.4–7.7)
Neutrophils Relative %: 64.8 % (ref 43.0–77.0)
Platelets: 119 10*3/uL — ABNORMAL LOW (ref 150.0–400.0)
RBC: 4.2 Mil/uL (ref 3.87–5.11)
RDW: 13.2 % (ref 11.5–15.5)
WBC: 4.7 10*3/uL (ref 4.0–10.5)

## 2019-07-30 LAB — FOLATE: Folate: 13.8 ng/mL (ref >5.9)

## 2019-07-30 LAB — VITAMIN B12: Vitamin B-12: 221 pg/mL (ref 211–911)

## 2019-07-30 LAB — FERRITIN: Ferritin: 36.9 ng/mL (ref 10.0–291.0)

## 2019-07-30 MED ORDER — PENTASA 250 MG PO CPCR: ORAL_CAPSULE | ORAL | 12 refills | Status: DC

## 2019-07-30 NOTE — Progress Notes (Signed)
Addendum: Reviewed and agree with assessment and management plan. Sanyah Molnar M, MD  

## 2019-07-30 NOTE — Progress Notes (Signed)
Subjective:    Patient ID: [name], female    DOB: October 18, 1946, 73 y.o.   MRN: [id number]  HPI [name] is a pleasant 73 year old white female, known to Dr. Hilarie Fredrickson who comes in today for routine follow-up of Crohn's ileocolitis. She was initially diagnosed greater than 20 years ago.  She has history of prior small bowel obstruction and known anal rectal stenosis.  She is status post prior end-to-end ileocolonic resection. She has been doing well and has not had any evidence of active disease. Last colonoscopy was done in December 2019 with finding of a mild anal stricture, no inflammatory changes in the colon and biopsies showed no active disease in the right or left colon There was one 10 mm polyp removed which while by biopsy was inflammatory. She has been maintained on Pentasa 250 mg, 4 tablets 3 times daily. She denies any current symptoms and says she has been feeling well over the past several months.  Appetite has been good, weight has been stable.  She says she usually has 4-5 bowel movements per day but that is normal for her since she had her bowel resection and cholecystectomy.  She denies any problems with diarrhea or rectal bleeding has not had any abdominal pain or cramping. Other medical problems include history of seizure disorder, osteoporosis, chronic ITP, history of endometrial CA and breast CA.  Review of Systems Pertinent positive and negative review of systems were noted in the above HPI section.  All other review of systems was otherwise negative.  Outpatient Encounter Medications as of 07/30/2019  Medication Sig  . acetaminophen (TYLENOL) 500 MG tablet Take by mouth.  . Calcium Carb-Cholecalciferol (CALCIUM 1000 + D PO) Take 1,000 mg by mouth 2 (two) times daily.  . Cholecalciferol (VITAMIN D) 1000 UNITS capsule Take 1,000 Units by mouth 2 (two) times daily.    . Cranberry 500 MG CAPS Take 500 mg by mouth daily.  . cyanocobalamin (,VITAMIN B-12,) 1000 MCG/ML  injection Inject 1,000 mcg into the muscle every 30 (thirty) days.   Marland Kitchen lamoTRIgine (LAMICTAL) 25 MG tablet Take 2 tablets twice a day  . levETIRAcetam (KEPPRA XR) 500 MG 24 hr tablet Take 4 tablets at bedtime  . mesalamine (PENTASA) 250 MG CR capsule TAKE 4 CAPSULES BY MOUTH THREE TIMES DAILY  . potassium chloride SA (K-DUR,KLOR-CON) 20 MEQ tablet Take 40 mEq by mouth 2 (two) times daily.   . [DISCONTINUED] mesalamine (PENTASA) 250 MG CR capsule TAKE 4 CAPSULES BY MOUTH THREE TIMES DAILY  . [DISCONTINUED] aspirin EC 81 MG tablet Take 81 mg by mouth daily.  . [DISCONTINUED] clotrimazole-betamethasone (LOTRISONE) cream Apply 1 application topically 2 (two) times daily. Apply to perianal rash twice daily  . [DISCONTINUED] pravastatin (PRAVACHOL) 10 MG tablet Take 5 mg by mouth every other day.    No facility-administered encounter medications on file as of 07/30/2019.    Allergies  Allergen Reactions  . Ciprofloxacin Other (See Comments)    Seizures  . Ampicillin Diarrhea  . Penicillins Diarrhea    Has patient had a PCN reaction causing immediate rash, facial/tongue/throat swelling, SOB or lightheadedness with hypotension: No Has patient had a PCN reaction causing severe rash involving mucus membranes or skin necrosis: No Has patient had a PCN reaction that required hospitalization No Has patient had a PCN reaction occurring within the last 10 years: No If all of the above answers are "NO", then may proceed with Cephalosporin use.  . Pneumococcal Vac Polyvalent Other (See Comments)  Local swelling  . Tetanus Toxoid Swelling    Arm swelling   Patient Active Problem List   Diagnosis Date Noted  . Closed compression fracture of thoracic vertebra ([place]) 05/18/2017  . Dense breast tissue on mammogram 09/21/2016  . Chronic ITP (idiopathic thrombocytopenia) ([facility]) 09/21/2016  . History of antineoplastic chemotherapy 07/27/2016  . Crohn's disease of both small and large intestine without  complication ([place])   . Benign neoplasm of transverse colon   . History of endometrial cancer 09/24/2015  . History of breast cancer in female 09/24/2015  . Seizure disorder ([place]) 09/24/2015  . High risk medication use 09/24/2015  . Bowel obstruction ([place]) 08/31/2015  . Crohn disease ([place]) 08/31/2015  . Thrombocytopenia ([place]) 08/31/2015  . 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 RLQ abdominal mass 08/03/2011  . Immunosuppressed status ([place]) 08/03/2011  . HYPOKALEMIA 03/24/2010  . CROHN'S DISEASE, SMALL INTESTINE 03/24/2010  . OTHER NONSPECIFIC ABNORMAL SERUM ENZYME LEVELS 01/01/2010  . VITAMIN B12 DEFICIENCY 12/09/2009  . STENOSIS OF RECTUM AND ANUS 12/09/2009  . ABDOMINAL PAIN-RLQ 12/09/2009  . ADENOCARCINOMA, UTERUS, HX OF 12/09/2009  . BREAST CANCER 12/03/2009  . ADENOCARCINOMA, ENDOMETRIUM 12/03/2009  . COLONIC POLYPS, HYPERPLASTIC 12/03/2009  . HYPERLIPIDEMIA 12/03/2009  . OSTEOPOROSIS 12/03/2009  . SEIZURE DISORDER 12/03/2009  . DIARRHEA 12/03/2009  . ABDOMINAL PAIN 12/03/2009   Social History   Socioeconomic History  . Marital status: Married    Spouse name: Not on file  . Number of children: 2  . Years of education: Not on file  . Highest education level: Not on file  Occupational History  . Occupation: Retired    Fish farm manager: RETIRED  Social Needs  . Financial resource strain: Not on file  . Food insecurity    Worry: Not on file    Inability: Not on file  . Transportation needs    Medical: Not on file    Non-medical: Not on file  Tobacco Use  . Smoking status: Never Smoker  . Smokeless tobacco: Never Used  Substance and Sexual Activity  . Alcohol use: No  . Drug use: No  . Sexual activity: Yes    Birth control/protection: Post-menopausal  Lifestyle  . Physical activity    Days per week: Not on file    Minutes per session: Not on file  . Stress: Not on file  Relationships  . Social Herbalist on phone: Not on file    Gets  together: Not on file    Attends religious service: Not on file    Active member of club or organization: Not on file    Attends meetings of clubs or organizations: Not on file    Relationship status: Not on file  . Intimate partner violence    Fear of current or ex partner: Not on file    Emotionally abused: Not on file    Physically abused: Not on file    Forced sexual activity: Not on file  Other Topics Concern  . Not on file  Social History Narrative   Pt lives at home with her husband in a one story house.    Ms. Kuna family history includes Aneurysm in her father; Breast cancer in her maternal aunt; Heart disease in her mother.      Objective:    Vitals:   07/30/19 0859  BP: 110/70  Pulse: 77    Physical Exam; well-developed older white female in no acute distress, pleasant.  Weight 124, BMI 22.6.  HEENT; nontraumatic  normocephalic EOMI PERRLA sclera anicteric, Oropharynx not examined/wearing mask/COVID .  Pulmonary clear bilaterally .  Cardiovascular regular rate and rhythm with S1-S2 no murmur rub or gallop Abdomen; soft, nontender, nondistended bowel sounds are present, no palpable mass or hepatosplenomegaly.  Long midline incisional scar Rectal exam not done Extremities no clubbing cyanosis or edema skin warm and dry Neuropsych alert and oriented, grossly normal, mood and affect appropriate       Assessment & Plan:   #74; 73 year old white female with history of Crohn's ileocolitis, here for routine follow-up.  She has been doing well and has no symptoms. Last colonoscopy December 2019 showed no evidence of active disease, and she has been maintained on Pentasa.  #2 history of mild anal stricture, asymptomatic #3.  Status post end to end ileocolonic anastomosis  Plan; Continue Pentasa 250 mg, 4 tablets 3 times daily. Check CBC with differential, c-Met, B12 and folate level. Patient is indicated for 3-year interval follow-up colonoscopy. She will  follow-up with Dr. Hilarie Fredrickson  or myself in 6 to 8 months, or sooner if needed.    Genia Harold PA-C 07/30/2019   Cc: Peggyann Juba, NP

## 2019-07-30 NOTE — Patient Instructions (Signed)
If you are age 75 or older, your body mass index should be between 23-30. Your Body mass index is 22.68 kg/m. If this is out of the aforementioned range listed, please consider follow up with your Primary Care Provider.  If you are age 29 or younger, your body mass index should be between 19-25. Your Body mass index is 22.68 kg/m. If this is out of the aformentioned range listed, please consider follow up with your Primary Care Provider.   Your provider has requested that you go to the basement level for lab work before leaving today. Press "B" on the elevator. The lab is located at the first door on the left as you exit the elevator.  We have sent the following medications to your pharmacy for you to pick up at your convenience: Pentasa  Follow up with Dr. Hilarie Fredrickson in 6-8 months or sooner if needed.  Thank you for choosing me and [facility].   Amy Esterwood, PA-C

## 2019-08-11 IMAGING — MR MR HEAD WO/W CM
13 series · 48 of 48 positions shown · IV contrast (multihance)
Comparison: CT head without contrast 05/11/2017

CLINICAL DATA: Seizure disorder. First seizure was 14 years ago.
Recent seizure episodes May and July 2017. Personal remote history
of breast cancer and uterine cancer.

EXAM:
MRI HEAD WITHOUT AND WITH CONTRAST
TECHNIQUE: Multiplanar, multiecho pulse sequences of the brain and surrounding
structures were obtained without and with intravenous contrast.
CONTRAST:  12mL MULTIHANCE GADOBENATE DIMEGLUMINE 529 MG/ML IV SOLN

[Series 5: T1 · sagittal · 4.0mm · 0.75mm/px · 1 of 31 slices shown (1 of 3)]
[im 1/31]
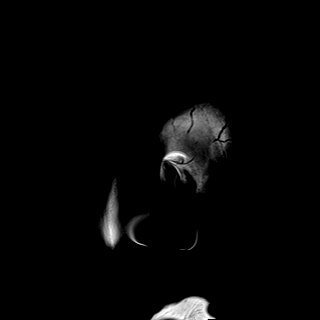

[Series 6: DWI · axial · 3.0mm · 1.44mm/px · z∈[-80,+61]mm · 4 of 87 slices shown (1 of 4)]
[im 1/87]
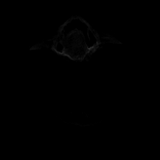
[im 29/87]
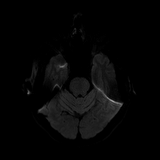
[im 58/87]
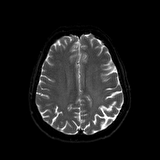
[im 87/87]
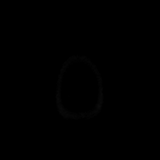

[Series 7: DWI · axial · 3.0mm · 1.44mm/px · z∈[-80,+61]mm · 3 of 44 slices shown (2 of 4)]
[im 1/44]
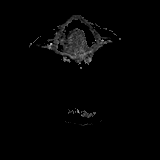
[im 22/44]
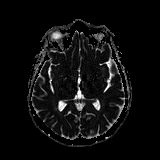
[im 44/44]
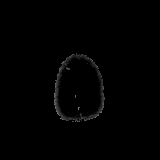

[Series 8: T2 · coronal · 3.0mm · 0.47mm/px · 2 of 26 slices shown (1 of 3)]
[im 1/26]
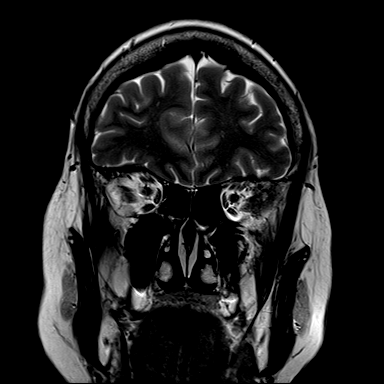
[im 26/26]
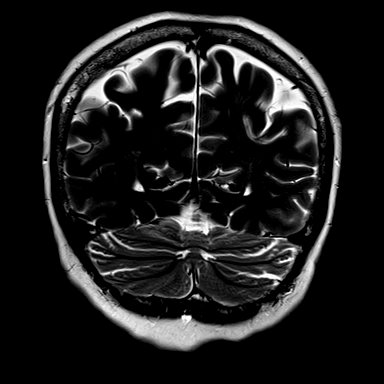

[Series 9: DWI · coronal · 5.0mm · 1.44mm/px · 4 of 60 slices shown (3 of 4)]
[im 1/60]
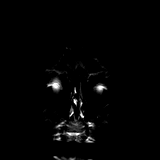
[im 20/60]
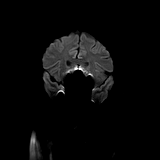
[im 40/60]
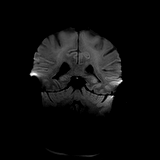
[im 60/60]
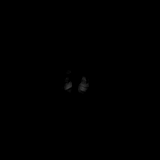

[Series 10: DWI · coronal · 5.0mm · 1.44mm/px · 2 of 30 slices shown (4 of 4)]
[im 1/30]
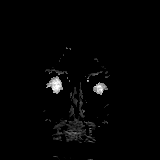
[im 30/30]
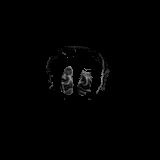

[Series 11: T2 · axial · 4.0mm · 0.36mm/px · z∈[-87,+62]mm · 2 of 30 slices shown (2 of 3)]
[im 1/30]
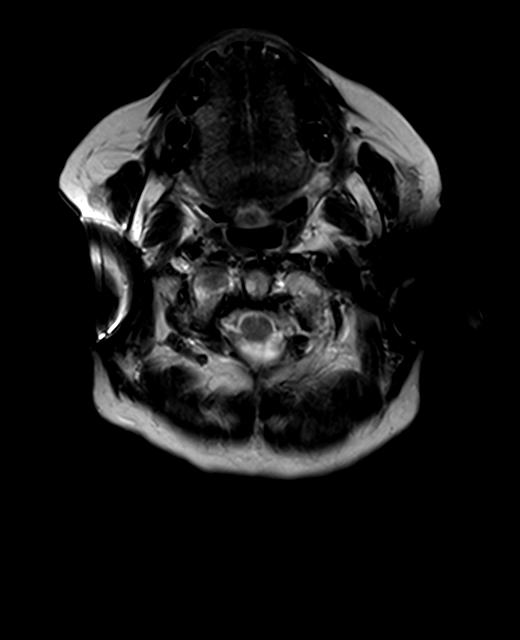
[im 30/30]
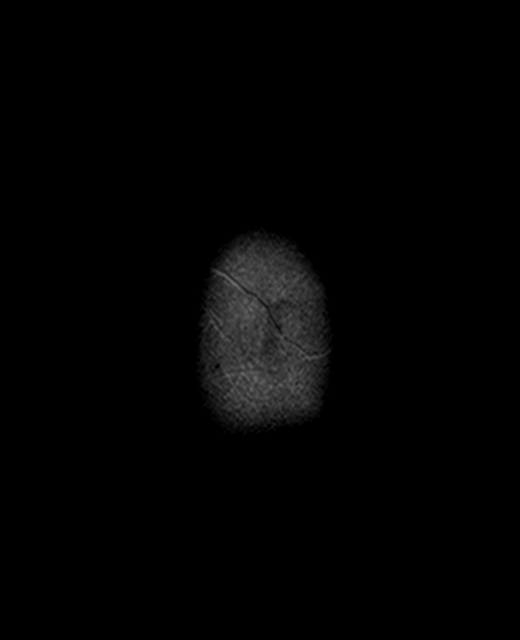

[Series 12: FLAIR · axial · 3.0mm · 0.72mm/px · z∈[-88,+61]mm · 2 of 26 slices shown]
[im 1/26]
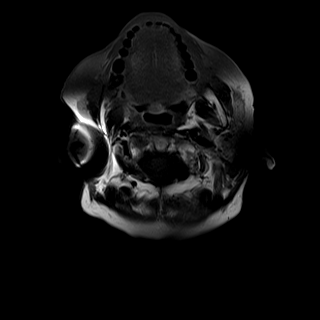
[im 26/26]
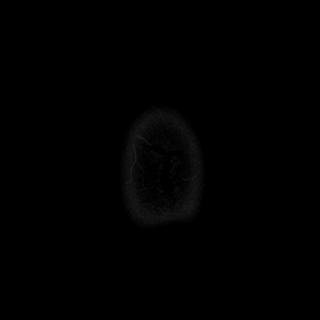

[Series 14: swi_images · axial · 1.5mm · 0.90mm/px · z∈[-83,+58]mm · 6 of 96 slices shown]
[im 1/96]
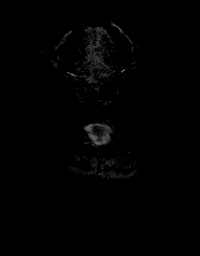
[im 20/96]
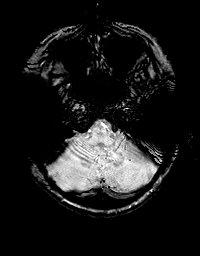
[im 39/96]
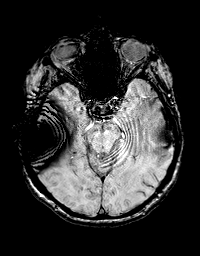
[im 58/96]
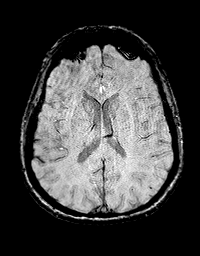
[im 77/96]
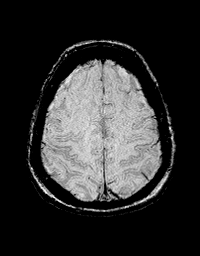
[im 96/96]
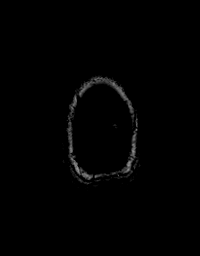

[Series 15: T1 · axial · 1.0mm · 0.90mm/px · z∈[-82,+60]mm · 9 of 144 slices shown (2 of 3)]
[im 1/144]
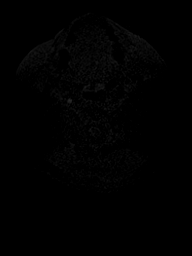
[im 18/144]
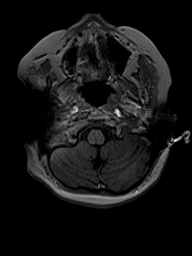
[im 36/144]
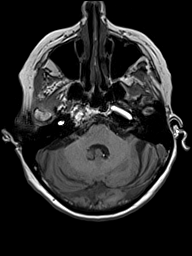
[im 54/144]
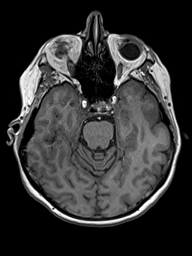
[im 72/144]
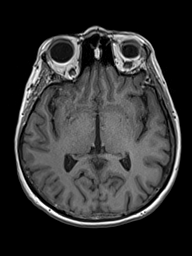
[im 90/144]
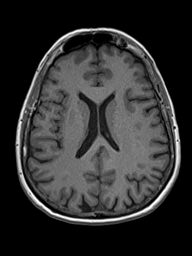
[im 108/144]
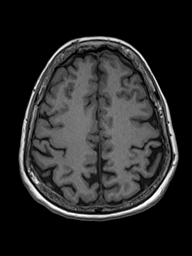
[im 126/144]
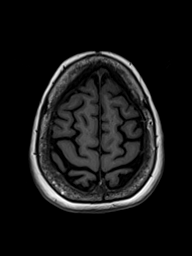
[im 144/144]
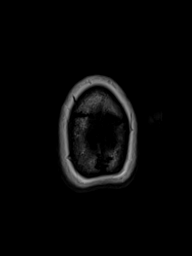

[Series 16: T2 · coronal · 4.5mm · 0.36mm/px · 2 of 30 slices shown (3 of 3)]
[im 1/30]
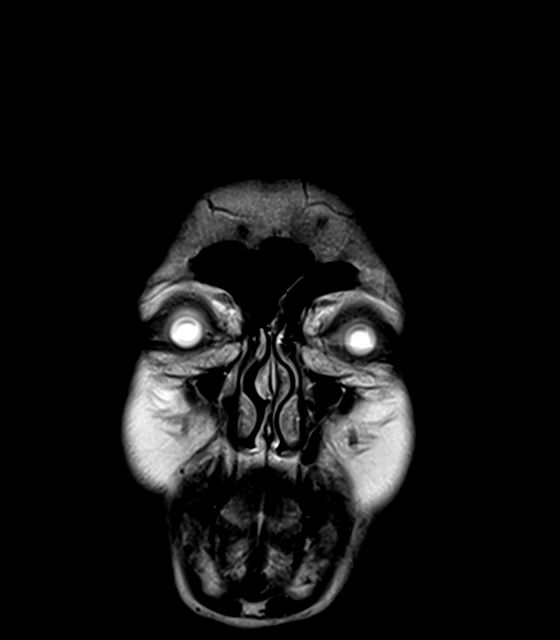
[im 30/30]
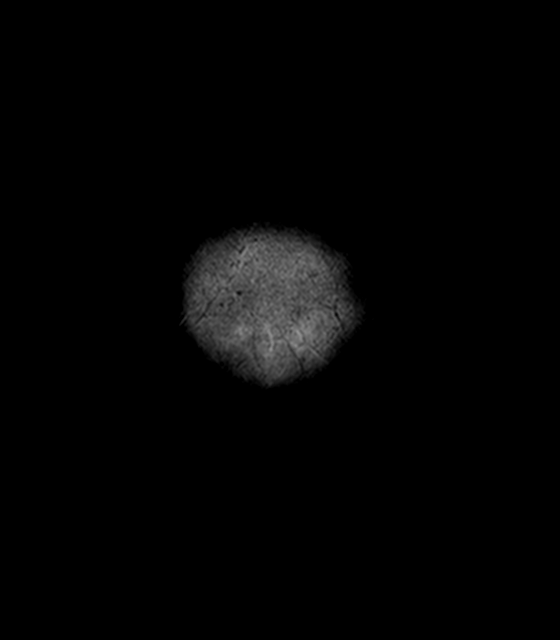

[Series 17: T1 · axial · 1.0mm · 0.90mm/px · z∈[-72,+71]mm · 9 of 144 slices shown (3 of 3)]
[im 1/144]
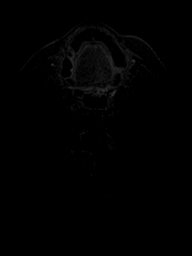
[im 18/144]
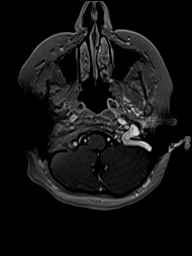
[im 36/144]
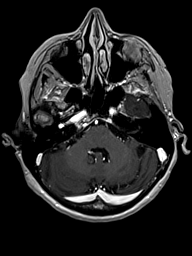
[im 54/144]
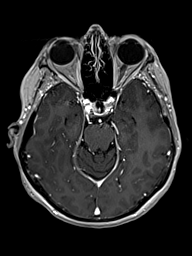
[im 72/144]
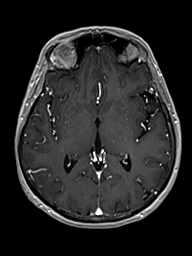
[im 90/144]
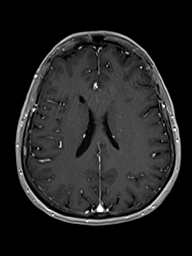
[im 108/144]
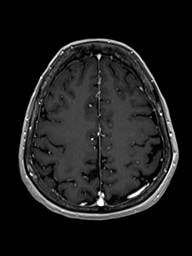
[im 126/144]
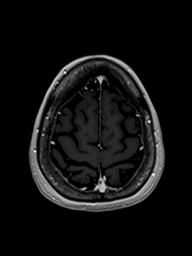
[im 144/144]
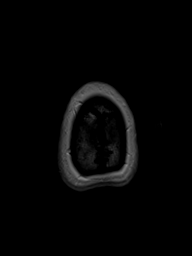

[Series 18: T1 post-contrast · coronal · 4.0mm · 0.72mm/px · 2 of 29 slices shown]
[im 1/29]
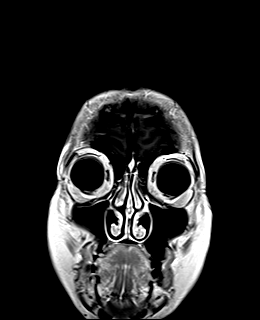
[im 29/29]
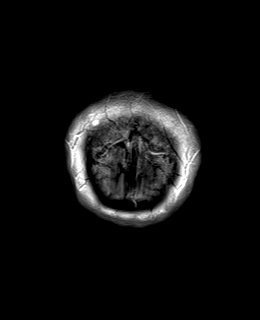

[48 of 48 positions shown; findings below may reference images not displayed]

FINDINGS: Brain: No acute infarct, hemorrhage, or mass lesion is present. The
ventricles are of normal size. No significant white matter disease
is present. There is no significant extra-axial fluid. Internal
auditory canals are within normal limits bilaterally. The brainstem
and cerebellum are normal.

The postcontrast images demonstrate no pathologic enhancement.

Vascular: Flow is present in the major intracranial arteries.

Skull and upper cervical spine: The skullbase is within normal
limits. The craniocervical junction is normal.

Sinuses/Orbits: The paranasal sinuses and mastoid air cells are
clear. Bilateral lens replacements are present. The globes and
orbits are within normal limits otherwise.

Other: Dedicated imaging of the temporal lobes demonstrate symmetric
size and signal of the hippocampal structures bilaterally. No focal
mass lesion is present. There is no asymmetric enhancement.
IMPRESSION: Negative MRI the brain. No acute or focal lesion to explain
seizures.

## 2019-12-24 ENCOUNTER — Telehealth: Payer: Self-pay | Admitting: Physician Assistant

## 2019-12-24 NOTE — Telephone Encounter (Signed)
Spoke with the patient. Encouraged to keep trying to get an appointment for the vaccine. It is recommended for immunocompromised patient.

## 2019-12-24 NOTE — Telephone Encounter (Signed)
Patient is advised.  

## 2019-12-24 NOTE — Telephone Encounter (Signed)
Yes , she should get the vaccine, unless she has hx of allergic rxn to vaccines in  the past - she can schedule now through [facility] , or Health dept

## 2020-01-11 ENCOUNTER — Telehealth: Payer: Self-pay | Admitting: Physician Assistant

## 2020-01-11 NOTE — Telephone Encounter (Signed)
Spoke to patient she said that she has changed her insurance and now her Pentasa is $100 a month, on top of that she said that her insurance will only let her have her the full Rx of 360 caps for one month as there is a limit is 150 capsules a month. Her new insurance card is not on file here, she lives in [place]. She has an appointment on 01-22-20 in [place] and will stop by here for [place] to update her information and work on a prior auth or a tier reduction if needed. Pt has picked up a month supply on the 22nd of January.

## 2020-01-11 NOTE — Telephone Encounter (Signed)
Patient is calling about her medication- pentasa. States that her insurance will only cover a certain amount. She is asking for a phone call back to discuss it and see what she can do. I told her I would have Amy's CMA call her cause she is who shows prescribed the medication last but she states that she wants to talk to Dr. Garth Schlatter CMA cause that's who she always talks to and he was the one who original put her on the medication.

## 2020-01-22 ENCOUNTER — Telehealth (INDEPENDENT_AMBULATORY_CARE_PROVIDER_SITE_OTHER): Payer: Medicare PPO | Admitting: Neurology

## 2020-01-22 ENCOUNTER — Other Ambulatory Visit: Payer: Self-pay

## 2020-01-22 ENCOUNTER — Encounter: Payer: Self-pay | Admitting: Neurology

## 2020-01-22 VITALS — Ht 61.0 in | Wt 127.0 lb

## 2020-01-22 DIAGNOSIS — G40309 Generalized idiopathic epilepsy and epileptic syndromes, not intractable, without status epilepticus: Secondary | ICD-10-CM

## 2020-01-22 MED ORDER — LAMOTRIGINE 25 MG PO TABS: ORAL_TABLET | ORAL | 3 refills | Status: DC

## 2020-01-22 MED ORDER — LEVETIRACETAM ER 500 MG PO TB24: ORAL_TABLET | ORAL | 3 refills | Status: DC

## 2020-01-22 NOTE — Progress Notes (Signed)
Telephone (Audio) Visit The purpose of this telephone visit is to provide medical care while limiting exposure to the novel coronavirus.    Consent was obtained for telephone visit:  Yes.   Answered questions that patient had about telehealth interaction:  Yes.   I discussed the limitations, risks, security and privacy concerns of performing an evaluation and management service by telephone. I also discussed with the patient that there may be a patient responsible charge related to this service. The patient expressed understanding and agreed to proceed.  Pt location: Home Physician Location: office Name of referring provider:  Peggyann Juba, NP I connected with .[name] at patients initiation/request on 01/22/2020 at  3:00 PM EST by telephone and verified that I am speaking with the correct person using two identifiers.  Pt MRN:  [id number] Pt DOB:  November 15, 1946   History of Present Illness:  The patient had a telephone visit on 01/22/2020. She was last seen a year ago in the neurology clinic for nocturnal seizures. She continues to do well seizure-free since August 2018. She had been seizure-free for 4 years, then had 2 nocturnal convulsions in May 2018, then another in August 2018. Lamotrigine was added on to Keppra XR. She is on Keppra XR 204m daily and Lamotrigine 544mBID without side effects. She denies any staring/unresponsive episodes, gaps in time, olfactory/gustatory hallucinations, focal numbness/tingling/weakness, myoclonic jerks. No headaches, dizziness, diplopia, no falls. Sleep and mood are good.   HPI: This is a pleasant [age] RH woman with a history of Crohn's disease, uterine cancer, and breast cancer s/p lumpectomy, chemo and radiation therapy 5 years ago, who started having seizures at age [id number] recalls going to the bathroom then back to bed early morning, then her next recollection was being brought to the hospital. Her husband heard her scream out  followed by whole body stiffening and shaking lasting 2-3 minutes, with associated tongue bite and urinary incontinence. She was brought to the hospital and not started on medication. Six months after, she had a second convulsion out of sleep with similar screaming out and body stiffening and shaking. She was again brought to the hospital, and per patient, not started on medication. A month later, she had a third nocturnal convulsion and saw her PCP who started her on Dilantin 40033may. She may or may not have seen a neurologist, she is unsure, but does not recall having an EEG done. She recalls being told that she had UTIs when she had the seizures. She was seizure-free for 9 years until 2013 when she forgot to take her medication and had another nocturnal convulsion where she fractured her right arm. Dilantin dose was increased to 600m59my. After a few months, she started having dizziness and difficulty ambulating. She went to the ER where he Dilantin level was supratherapeutic and she was instructed to stop the medication for a few days then restart at 200mg31m. She has been doing well since with no seizures and no further side effects. She and her husband deny any staring/unresponsive episodes, no gaps in time, olfactory/gustatory hallucinations, deja vu, rising epigastric sensation, focal numbness/tingling/weakness, myoclonic jerks. She denies any dizziness, diplopia, gait instability, gum problems. She had a DEXA scan in 11/2012 and has a diagnosis of osteoporosis. She denies any headaches, dysarthria, dysphagia, neck/back pain, bowel/bladder dysfunction. No chest pain or shortness of breath. She is driving.  She was in the ER last 05/11/17 after a breakthrough nocturnal seizure. She recalls  seeing her husband go to the bathroom at 5 or 6 in the morning, then has no recollection of events until waking up with EMS around her. Apparently she vocalized in bed and started having a convulsion  with both arms drawn up, shaking and stiff for around 2 minutes. She was confused on EMS arrival, but back to baseline in the ER. She reports taking only 547m of Keppra the night prior because she thought she already took it. She denies any sleep deprivation. Bloodwork unremarkable, head CT no acute changes. She had an abnormal urinalysis with small LE, WBC 9, many bacteria, moderate blood. Urine culture showed 10,000-25,000 cfu/ml with polymicrobial growth. She was discharged on antibiotics. She did not have any UTI symptoms, but states that breakthrough seizures in the past occurred with a UTI or missing a dose of medication. She was complaining of back pain, and thoracic xray showed a T2 compression fracture, likely subacute, as well as remote T3-7 compression fractures with advanced height loss. She was given a prescription for Percocet, she states Tylenol does not help. Her Keppra level was 23.1. Platelet count was 86 (121 in 02/2017).  Epilepsy Risk Factors: Her maternal aunt had seizures, otherwise she had a normal birth and early development. There is no history of febrile convulsions, CNS infections such as meningitis/encephalitis, significant traumatic brain injury, neurosurgical procedures.  Prior AEDs: Dilantin  EEG 05/01/14: occasional independent focal slowing over the bilateral temporal regions, left greater than right. Imaging: Head CT without contrast done 07/13/2013 reported as unremarkable. I personally reviewed MRI brain with and without contrast done 10/03/17 which did not show any acute changes, hippocampi symmetric without abnormal signal or enhancement.    Observations/Objective:   Vitals:   01/22/20 1428  Weight: 127 lb (57.6 kg)  Height: 5' 1"  (1.549 m)   Exam limited due to nature of phone visit. Patient awake, alert,able to answer questions without dysarthria or confusion.   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 Etiology of seizures unclear. She had been seizure-free for 4 years until breakthrough seizures in 2018 in the setting of a UTI. Lamotrigine was added to Levetiracetam, she has been seizure-free since August 2018 on Levetiracetam ER 20061mdaily and Lamotrigine 5091mID, refills sent. She is aware of [place] driving laws to stop driving after a seizure, until 6 months seizure-free. She will follow-up in 1 year and knows to call our office for any problems.   Follow Up Instructions:   -I discussed the assessment and treatment plan with the patient. The patient was provided an opportunity to ask questions and all were answered. The patient agreed with the plan and demonstrated an understanding of the instructions.   The patient was advised to call back or seek an in-person evaluation if the symptoms worsen or if the condition fails to improve as anticipated.    Total Time spent in visit with the patient was:  5:32 minutes, of which 100% of the time was spent in counseling and/or coordinating care on the above.   Pt understands and agrees with the plan of care outlined.     KarCameron SprangD

## 2020-01-24 NOTE — Telephone Encounter (Signed)
[employer] has approved patient's Pentasa 250 mg capsule until 12/12/20.

## 2020-02-21 ENCOUNTER — Encounter: Payer: Self-pay | Admitting: Internal Medicine

## 2020-02-21 ENCOUNTER — Other Ambulatory Visit: Payer: Self-pay

## 2020-02-21 ENCOUNTER — Other Ambulatory Visit (INDEPENDENT_AMBULATORY_CARE_PROVIDER_SITE_OTHER): Payer: Medicare PPO

## 2020-02-21 ENCOUNTER — Ambulatory Visit: Payer: Medicare PPO | Admitting: Internal Medicine

## 2020-02-21 VITALS — BP 134/78 | HR 86 | Temp 97.3°F | Ht 62.0 in | Wt 126.0 lb

## 2020-02-21 DIAGNOSIS — R195 Other fecal abnormalities: Secondary | ICD-10-CM

## 2020-02-21 DIAGNOSIS — E538 Deficiency of other specified B group vitamins: Secondary | ICD-10-CM

## 2020-02-21 DIAGNOSIS — K5080 Crohn's disease of both small and large intestine without complications: Secondary | ICD-10-CM

## 2020-02-21 LAB — CBC WITH DIFFERENTIAL/PLATELET
Basophils Absolute: 0 10*3/uL (ref 0.0–0.1)
Basophils Relative: 0.7 % (ref 0.0–3.0)
Eosinophils Absolute: 0.1 10*3/uL (ref 0.0–0.7)
Eosinophils Relative: 1.5 % (ref 0.0–5.0)
HCT: 37.6 % (ref 36.0–46.0)
Hemoglobin: 12.9 g/dL (ref 12.0–15.0)
Lymphocytes Relative: 25.5 % (ref 12.0–46.0)
Lymphs Abs: 1.1 10*3/uL (ref 0.7–4.0)
MCHC: 34.4 g/dL (ref 30.0–36.0)
MCV: 94 fl (ref 78.0–100.0)
Monocytes Absolute: 0.2 10*3/uL (ref 0.1–1.0)
Monocytes Relative: 4.2 % (ref 3.0–12.0)
Neutro Abs: 3 10*3/uL (ref 1.4–7.7)
Neutrophils Relative %: 68.1 % (ref 43.0–77.0)
Platelets: 144 10*3/uL — ABNORMAL LOW (ref 150.0–400.0)
RBC: 4 Mil/uL (ref 3.87–5.11)
RDW: 13.2 % (ref 11.5–15.5)
WBC: 4.3 10*3/uL (ref 4.0–10.5)

## 2020-02-21 LAB — COMPREHENSIVE METABOLIC PANEL
ALT: 11 U/L (ref 0–35)
AST: 15 U/L (ref 0–37)
Albumin: 4.2 g/dL (ref 3.5–5.2)
Alkaline Phosphatase: 79 U/L (ref 39–117)
BUN: 9 mg/dL (ref 6–23)
CO2: 26 mEq/L (ref 19–32)
Calcium: 9.9 mg/dL (ref 8.4–10.5)
Chloride: 102 mEq/L (ref 96–112)
Creatinine, Ser: 0.93 mg/dL (ref 0.40–1.20)
GFR: 58.67 mL/min — ABNORMAL LOW (ref >60.00)
Glucose, Bld: 92 mg/dL (ref 70–99)
Potassium: 4.2 mEq/L (ref 3.5–5.1)
Sodium: 135 mEq/L (ref 135–145)
Total Bilirubin: 0.5 mg/dL (ref 0.2–1.2)
Total Protein: 7.5 g/dL (ref 6.0–8.3)

## 2020-02-21 LAB — VITAMIN B12: Vitamin B-12: 288 pg/mL (ref 211–911)

## 2020-02-21 LAB — IBC + FERRITIN
Ferritin: 42.2 ng/mL (ref 10.0–291.0)
Iron: 96 ug/dL (ref 42–145)
Saturation Ratios: 25.5 % (ref 20.0–50.0)
Transferrin: 269 mg/dL (ref 212.0–360.0)

## 2020-02-21 NOTE — Patient Instructions (Signed)
Your provider has requested that you go to the basement level for lab work before leaving today. Press "B" on the elevator. The lab is located at the first door on the left as you exit the elevator. ___________________________________________________ Continue Pentasa at your current dose. ___________________________________________________ Continue B12. ____________________________________________________ If you are age 76 or older, your body mass index should be between 23-30. Your Body mass index is 23.05 kg/m. If this is out of the aforementioned range listed, please consider follow up with your Primary Care Provider.  If you are age 63 or younger, your body mass index should be between 19-25. Your Body mass index is 23.05 kg/m. If this is out of the aformentioned range listed, please consider follow up with your Primary Care Provider.  _____________________________________________________ Due to recent changes in healthcare laws, you may see the results of your imaging and laboratory studies on MyChart before your provider has had a chance to review them.  We understand that in some cases there may be results that are confusing or concerning to you. Not all laboratory results come back in the same time frame and the provider may be waiting for multiple results in order to interpret others.  Please give [place] 48 hours in order for your provider to thoroughly review all the results before contacting the office for clarification of your results.

## 2020-02-21 NOTE — Progress Notes (Signed)
   Subjective:    Patient ID: JADEYN HARGETT, female    DOB: [birth year], 74 y.o.   MRN: [id number]  HPI Siearra Amberg is a 74 year old female with a history of ileocolonic Crohn's disease here for follow-up (diagnosis greater than 20 years ago).  She has a history of prior small bowel obstruction and mild anorectal stenosis.  She also has had prior in to end ileocolonic resection.  She was last seen on 07/30/2019.  She is here alone today.  She reports that she has been doing well.  She was told by primary care she had borderline iron levels but oral iron was avoided due to his concern for constipation.  She tried a Centrum silver containing iron but this caused burning in her mid left abdomen so she did not take it again.  She is using Pentasa for capsules 3 times daily though sometimes she forgets the middle of the day dose.  Her bowel movements have been loose and worse after eating.  They are still urgent but not bloody.  Is much is 4-5 times per day.  She is not sure if this means her Crohn's disease is active.  She does not have upper GI or hepatobiliary complaint.   Review of Systems As per HPI, otherwise negative  Current Medications, Allergies, Past Medical History, Past Surgical History, Family History and Social History were reviewed in [employer] record.    Objective:   Physical Exam BP 134/78 (BP Location: Left Arm, Patient Position: Sitting, Cuff Size: Normal)   Pulse 86   Temp (!) 97.3 F (36.3 C)   Ht 5' 2"  (1.575 m)   Wt 126 lb (57.2 kg)   SpO2 99%   BMI 23.05 kg/m  Gen: awake, alert, NAD HEENT: anicteric, op clear CV: RRR, no mrg Pulm: CTA b/l Abd: soft, NT/ND, +BS throughout Ext: no c/c/e Neuro: nonfocal      Assessment & Plan:  74 year old female with a history of ileocolonic Crohn's disease here for follow-up (diagnosis greater than 20 years ago).   1.  Ileocolonic Crohn's disease with loose stools --unclear if loose stools  represent active disease.  Her disease was in remission at the time of last colonoscopy in December 2019.  She has been maintained on Pentasa --Continue Pentasa 250 mg capsules, 4 capsules 3 times daily --Check a fecal calprotectin and if elevated consider budesonide if not elevated consider Lomotil as needed, also if elevated would consider cross-sectional imaging with enterography --Check CBC, CMP, ferritin and B12 --Colonoscopy recommended 3 years which would be December 2022  2.  B12 deficiency --continue IM B12 monthly, check B12 today  30 minutes total spent today including patient facing time, coordination of care, reviewing medical history/procedures/pertinent radiology studies, and documentation of the encounter.

## 2020-03-06 ENCOUNTER — Other Ambulatory Visit: Payer: Medicare PPO

## 2020-03-06 DIAGNOSIS — K5080 Crohn's disease of both small and large intestine without complications: Secondary | ICD-10-CM

## 2020-03-08 LAB — CALPROTECTIN, FECAL: Calprotectin, Fecal: 40 ug/g (ref 0–120)

## 2020-03-17 ENCOUNTER — Other Ambulatory Visit: Payer: Self-pay

## 2020-03-17 MED ORDER — DIPHENOXYLATE-ATROPINE 2.5-0.025 MG PO TABS: 1.0000 | ORAL_TABLET | Freq: Three times a day (TID) | ORAL | 1 refills | Status: DC | PRN

## 2020-03-21 ENCOUNTER — Other Ambulatory Visit (HOSPITAL_COMMUNITY): Payer: Self-pay

## 2020-03-24 ENCOUNTER — Ambulatory Visit (HOSPITAL_COMMUNITY)
Admission: RE | Admit: 2020-03-24 | Discharge: 2020-03-24 | Disposition: A | Discharge status: Home / Self Care | Payer: Medicare PPO | Source: Ambulatory Visit | Attending: Internal Medicine | Admitting: Internal Medicine

## 2020-03-24 ENCOUNTER — Other Ambulatory Visit: Payer: Self-pay

## 2020-03-24 DIAGNOSIS — M810 Age-related osteoporosis without current pathological fracture: Secondary | ICD-10-CM | POA: Insufficient documentation

## 2020-03-24 MED ORDER — ZOLEDRONIC ACID 5 MG/100ML IV SOLN
INTRAVENOUS | Status: AC
  Administered 2020-03-24: 5 mg via INTRAVENOUS
  Filled 2020-03-24: qty 100

## 2020-03-24 MED ORDER — ZOLEDRONIC ACID 5 MG/100ML IV SOLN: 5.0000 mg | Freq: Once | INTRAVENOUS | Status: AC

## 2020-04-09 ENCOUNTER — Telehealth: Payer: Self-pay | Admitting: Internal Medicine

## 2020-04-09 NOTE — Telephone Encounter (Signed)
New script is required for Lomotil

## 2020-04-09 NOTE — Telephone Encounter (Signed)
Spoke with Catalina Antigua, pharmacist at [facility] who indicates that a new rx is needed for lomotil on patient as the DEA requires that new prescriptions of controlled substances be given for 7 day supply and if medication becomes "chronic", meaning it is needed for longer term, new prescription sis needed, then a new script must be obtained. I have given verbal authorization for lomotil 1 tablet three times daily as needed #90 with 1 refill as per our previous instructions. He verbalizes understanding.

## 2020-06-26 ENCOUNTER — Other Ambulatory Visit: Payer: Self-pay | Admitting: Neurology

## 2020-06-26 DIAGNOSIS — G40309 Generalized idiopathic epilepsy and epileptic syndromes, not intractable, without status epilepticus: Secondary | ICD-10-CM

## 2020-08-20 ENCOUNTER — Other Ambulatory Visit: Payer: Self-pay | Admitting: Physician Assistant

## 2020-11-05 ENCOUNTER — Other Ambulatory Visit: Payer: Self-pay | Admitting: Physician Assistant

## 2020-12-23 ENCOUNTER — Telehealth: Payer: Self-pay | Admitting: Physician Assistant

## 2020-12-25 NOTE — Telephone Encounter (Signed)
Patient calling to follow up on previous message. 

## 2020-12-25 NOTE — Telephone Encounter (Signed)
Prior Authorization has been started

## 2020-12-25 NOTE — Telephone Encounter (Signed)
I do not write her Lamictal  She needs to contact the prescribing provider for this med

## 2020-12-25 NOTE — Telephone Encounter (Signed)
Patient has been approved through 12/12/2021

## 2020-12-25 NOTE — Telephone Encounter (Signed)
Patient was notified that the Pentasa was approved.  Patient would like to know if her Lamictal can be refilled?

## 2020-12-26 MED ORDER — DIPHENOXYLATE-ATROPINE 2.5-0.025 MG PO TABS: 1.0000 | ORAL_TABLET | Freq: Three times a day (TID) | ORAL | 1 refills | Status: DC | PRN

## 2020-12-26 NOTE — Telephone Encounter (Signed)
Sorry, that was supposed to be her Lomotil that she needs refilled.

## 2020-12-26 NOTE — Telephone Encounter (Signed)
Patient has been notified refills have been sent in

## 2020-12-26 NOTE — Telephone Encounter (Signed)
Ok to refill lomotil

## 2020-12-26 NOTE — Addendum Note (Signed)
Addended by: Aleatha Borer on: 12/26/2020 04:05 PM   Modules accepted: Orders

## 2021-01-20 ENCOUNTER — Encounter: Payer: Self-pay | Admitting: Neurology

## 2021-01-20 ENCOUNTER — Ambulatory Visit: Payer: Medicare PPO | Admitting: Neurology

## 2021-01-20 ENCOUNTER — Other Ambulatory Visit: Payer: Self-pay

## 2021-01-20 DIAGNOSIS — G40309 Generalized idiopathic epilepsy and epileptic syndromes, not intractable, without status epilepticus: Secondary | ICD-10-CM

## 2021-01-20 MED ORDER — LEVETIRACETAM ER 500 MG PO TB24: ORAL_TABLET | ORAL | 3 refills | Status: DC

## 2021-01-20 MED ORDER — LAMOTRIGINE 25 MG PO TABS: ORAL_TABLET | ORAL | 3 refills | Status: DC

## 2021-01-20 NOTE — Patient Instructions (Signed)
Always a pleasure to see you. Continue all your medications, refills have been sent. Follow-up in 1 year, call for any changes   Seizure Precautions: 1. If medication has been prescribed for you to prevent seizures, take it exactly as directed.  Do not stop taking the medicine without talking to your doctor first, even if you have not had a seizure in a long time.   2. Avoid activities in which a seizure would cause danger to yourself or to others.  Don't operate dangerous machinery, swim alone, or climb in high or dangerous places, such as on ladders, roofs, or girders.  Do not drive unless your doctor says you may.  3. If you have any warning that you may have a seizure, lay down in a safe place where you can't hurt yourself.    4.  No driving for 6 months from last seizure, as per [facility]   Please refer to the following link on the [place] website for more information: http://[email or web address].org/answerplace/Social/driving/drivingu.cfm   5.  Maintain good sleep hygiene.  6.  Contact your doctor if you have any problems that may be related to the medicine you are taking.  7.  Call 911 and bring the patient back to the ED if:        A.  The seizure lasts longer than 5 minutes.       B.  The patient doesn't awaken shortly after the seizure  C.  The patient has new problems such as difficulty seeing, speaking or moving  D.  The patient was injured during the seizure  E.  The patient has a temperature over 102 F (39C)  F.  The patient vomited and now is having trouble breathing

## 2021-01-20 NOTE — Progress Notes (Signed)
NEUROLOGY FOLLOW UP OFFICE NOTE  [name] [id number] 12-27-1945  HISTORY OF PRESENT ILLNESS: I had the pleasure of seeing [name] in follow-up in the neurology clinic on 01/20/2021.  The patient was last evaluated in the neurology clinic a year ago for nocturnal seizures. She has been doing well seizure-free since August 2018. She had been seizure-free for 4 years, then had 2 nocturnal convulsions in May 2018 and another in August 2018. Lamotrigine was added to Keppra XR, and she has been seizure-free since then. She denies any staring/unresponsive episodes, gaps in time, olfactory/gustatory hallucinations, focal numbness/tingling/weakness, myoclonic jerks. No headaches, dizziness, vision changes, no falls. No side effects on Keppra XR 205m daily and Lamotrigine 521mBID. Sleep and mood are good.   HPI: This is a pleasant [age] RH woman with a history of Crohn's disease, uterine cancer, and breast cancer s/p lumpectomy, chemo and radiation therapy 5 years ago, who started having seizures at age [id number] recalls going to the bathroom then back to bed early morning, then her next recollection was being brought to the hospital. Her husband heard her scream out followed by whole body stiffening and shaking lasting 2-3 minutes, with associated tongue bite and urinary incontinence. She was brought to the hospital and not started on medication. Six months after, she had a second convulsion out of sleep with similar screaming out and body stiffening and shaking. She was again brought to the hospital, and per patient, not started on medication. A month later, she had a third nocturnal convulsion and saw her PCP who started her on Dilantin 400108may. She may or may not have seen a neurologist, she is unsure, but does not recall having an EEG done. She recalls being told that she had UTIs when she had the seizures. She was seizure-free for 9 years until 2013 when she forgot to take  her medication and had another nocturnal convulsion where she fractured her right arm. Dilantin dose was increased to 600m66my. After a few months, she started having dizziness and difficulty ambulating. She went to the ER where he Dilantin level was supratherapeutic and she was instructed to stop the medication for a few days then restart at 200mg8m. She has been doing well since with no seizures and no further side effects. She and her husband deny any staring/unresponsive episodes, no gaps in time, olfactory/gustatory hallucinations, deja vu, rising epigastric sensation, focal numbness/tingling/weakness, myoclonic jerks. She denies any dizziness, diplopia, gait instability, gum problems. She had a DEXA scan in 11/2012 and has a diagnosis of osteoporosis. She denies any headaches, dysarthria, dysphagia, neck/back pain, bowel/bladder dysfunction. No chest pain or shortness of breath. She is driving.  She was in the ER last 05/11/17 after a breakthrough nocturnal seizure. She recalls seeing her husband go to the bathroom at 5 or 6 in the morning, then has no recollection of events until waking up with EMS around her. Apparently she vocalized in bed and started having a convulsion with both arms drawn up, shaking and stiff for around 2 minutes. She was confused on EMS arrival, but back to baseline in the ER. She reports taking only 500mg 41meppra the night prior because she thought she already took it. She denies any sleep deprivation. Bloodwork unremarkable, head CT no acute changes. She had an abnormal urinalysis with small LE, WBC 9, many bacteria, moderate blood. Urine culture showed 10,000-25,000 cfu/ml with polymicrobial growth. She was discharged on antibiotics. She did not have any  UTI symptoms, but states that breakthrough seizures in the past occurred with a UTI or missing a dose of medication. She was complaining of back pain, and thoracic xray showed a T2 compression fracture, likely  subacute, as well as remote T3-7 compression fractures with advanced height loss. She was given a prescription for Percocet, she states Tylenol does not help. Her Keppra level was 23.1. Platelet count was 86 (121 in 02/2017).  Epilepsy Risk Factors: Her maternal aunt had seizures, otherwise she had a normal birth and early development. There is no history of febrile convulsions, CNS infections such as meningitis/encephalitis, significant traumatic brain injury, neurosurgical procedures.  Prior AEDs: Dilantin  EEG 05/01/14: occasional independent focal slowing over the bilateral temporal regions, left greater than right. Imaging: Head CT without contrast done 07/13/2013 reported as unremarkable. I personally reviewed MRI brain with and without contrast done 10/03/17 which did not show any acute changes, hippocampi symmetric without abnormal signal or enhancement.    PAST MEDICAL HISTORY: Past Medical History:  Diagnosis Date  . Bowel obstruction ([place])   . Cataract    surgically removed  . Colitis   . Crohn's disease ([place])   . Epilepsy ([place])   . Hyperlipidemia   . Malignant neoplasm of breast (female), unspecified site    right - do not use right arm for BP, IV or blood draws  . Malignant neoplasm of corpus uteri, except isthmus ([place])   . Osteoporosis, unspecified   . Personal history of colonic polyps    hyperplastic  . Pneumonia   . Renal cyst   . Seizure disorder ([place])    last seizure was 07/2017, controlled with meds  . SVD (spontaneous vaginal delivery)    x 2  . Thrombocytopenia ([place])     MEDICATIONS: Current Outpatient Medications on File Prior to Visit  Medication Sig Dispense Refill  . acetaminophen (TYLENOL) 500 MG tablet Take 1,000 mg by mouth every 6 (six) hours as needed.     . Calcium Carb-Cholecalciferol (CALCIUM 1000 + D PO) Take 1,000 mg by mouth 2 (two) times daily.    . Cholecalciferol (VITAMIN D) 1000 UNITS capsule Take 1,000 Units by mouth 2 (two) times  daily.      . Cranberry 500 MG CAPS Take 500 mg by mouth daily.    . cyanocobalamin (,VITAMIN B-12,) 1000 MCG/ML injection Inject 1,000 mcg into the muscle every 30 (thirty) days.     . diphenoxylate-atropine (LOMOTIL) 2.5-0.025 MG tablet Take 1 tablet by mouth 3 (three) times daily as needed for diarrhea or loose stools. 90 tablet 1  . lamoTRIgine (LAMICTAL) 25 MG tablet Take 2 tablets twice a day 360 tablet 3  . levETIRAcetam (KEPPRA XR) 500 MG 24 hr tablet TAKE 4 TABLETS BY MOUTH AT BEDTIME 360 tablet 3  . PENTASA 250 MG CR capsule TAKE 4 CAPSULES BY MOUTH THREE TIMES DAILY 360 capsule 2  . potassium chloride SA (K-DUR,KLOR-CON) 20 MEQ tablet Take 40 mEq by mouth 2 (two) times daily.      No current facility-administered medications on file prior to visit.    ALLERGIES: Allergies  Allergen Reactions  . Ciprofloxacin Other (See Comments)    Seizures  . Ampicillin Diarrhea  . Penicillins Diarrhea    Has patient had a PCN reaction causing immediate rash, facial/tongue/throat swelling, SOB or lightheadedness with hypotension: No Has patient had a PCN reaction causing severe rash involving mucus membranes or skin necrosis: No Has patient had a PCN reaction that required hospitalization No  Has patient had a PCN reaction occurring within the last 10 years: No If all of the above answers are "NO", then may proceed with Cephalosporin use.  . Pneumococcal Vac Polyvalent Other (See Comments)    Local swelling  . Tetanus Toxoid Swelling    Arm swelling    FAMILY HISTORY: Family History  Problem Relation Age of Onset  . Heart disease Mother   . Aneurysm Father   . Breast cancer Maternal Aunt   . Colon cancer Neg Hx   . Rectal cancer Neg Hx   . Stomach cancer Neg Hx     SOCIAL HISTORY: Social History   Socioeconomic History  . Marital status: Married    Spouse name: Not on file  . Number of children: 2  . Years of education: Not on file  . Highest education level: Not on file   Occupational History  . Occupation: Retired    Fish farm manager: RETIRED  Tobacco Use  . Smoking status: Never Smoker  . Smokeless tobacco: Never Used  Vaping Use  . Vaping Use: Never used  Substance and Sexual Activity  . Alcohol use: No  . Drug use: No  . Sexual activity: Yes    Birth control/protection: Post-menopausal  Other Topics Concern  . Not on file  Social History Narrative   Pt lives at home with her husband in a one story house.   Right handed   Social Determinants of Health   Financial Resource Strain: Not on file  Food Insecurity: Not on file  Transportation Needs: Not on file  Physical Activity: Not on file  Stress: Not on file  Social Connections: Not on file  Intimate Partner Violence: Not on file     PHYSICAL EXAM: Vitals:   01/20/21 0957  BP: 111/74  Pulse: 87  SpO2: 95%   General: No acute distress Head:  Normocephalic/atraumatic Skin/Extremities: No rash, no edema Neurological Exam: alert and awake. No aphasia or dysarthria. Fund of knowledge is appropriate.  Recent and remote memory are intact.  Attention and concentration are normal.   Cranial nerves: Pupils equal, round. Extraocular movements intact with no nystagmus. Visual fields full.  No facial asymmetry.  Motor: Bulk and tone normal, muscle strength 5/5 throughout with no pronator drift.   Finger to nose testing intact.  Gait narrow-based and steady, no ataxia.  Romberg negative.   IMPRESSION: This is a pleasant 75 yo RH woman with a history of Crohn's disease, uterine cancer, breast cancer s/p lumpectomy, chemo, and radiation therapy, with a history of convulsive seizures that occur out of sleep since age 13 or 39. MRI brain normal, EEG showed bilateral temporal slowing, left greater than right. Etiology of seizures unclear. She had been seizure-free for 4 years until breakthrough seizures in 2018 in the setting of a UTI. With addition of Lamotrigine to Levetiracetam, she has been seizure-free  since August 2018. Refills sent for Lamotrigine 35m BID and Levetiracetam ER 20040mdaily. She is aware of [place] driving laws to stop driving after a seizure until 6 months seizure-free. Follow-up in 1 year, she knows to call for any changes.   Thank you for allowing me to participate in her care.  Please do not hesitate to call for any questions or concerns.   KaEllouise NewerM.D.   CC: DoEstrella DeedsNP

## 2021-03-06 ENCOUNTER — Other Ambulatory Visit: Payer: Self-pay | Admitting: Physician Assistant

## 2021-03-31 ENCOUNTER — Encounter (HOSPITAL_COMMUNITY): Payer: Medicare PPO

## 2021-04-08 ENCOUNTER — Other Ambulatory Visit: Payer: Self-pay | Admitting: Physician Assistant

## 2021-04-08 ENCOUNTER — Other Ambulatory Visit (HOSPITAL_COMMUNITY): Payer: Self-pay

## 2021-04-09 ENCOUNTER — Other Ambulatory Visit: Payer: Self-pay

## 2021-04-09 ENCOUNTER — Ambulatory Visit (HOSPITAL_COMMUNITY)
Admission: RE | Admit: 2021-04-09 | Discharge: 2021-04-09 | Disposition: A | Discharge status: Home / Self Care | Payer: Medicare PPO | Source: Ambulatory Visit | Attending: Internal Medicine | Admitting: Internal Medicine

## 2021-04-09 DIAGNOSIS — M810 Age-related osteoporosis without current pathological fracture: Secondary | ICD-10-CM | POA: Insufficient documentation

## 2021-04-09 MED ORDER — ZOLEDRONIC ACID 5 MG/100ML IV SOLN: 5.0000 mg | Freq: Once | INTRAVENOUS | Status: AC

## 2021-04-09 MED ORDER — ZOLEDRONIC ACID 5 MG/100ML IV SOLN
INTRAVENOUS | Status: AC
  Administered 2021-04-09: 5 mg via INTRAVENOUS
  Filled 2021-04-09: qty 100

## 2021-04-09 NOTE — Telephone Encounter (Signed)
Last OV March 2021. Please advise on refill.

## 2021-04-13 NOTE — Telephone Encounter (Signed)
Ok to refill x 3 months , let pt know she needs an office visit

## 2021-04-13 NOTE — Telephone Encounter (Signed)
Patient called in with a refill for Pentasa. Have an appointment scheduled for 5/25.

## 2021-05-06 ENCOUNTER — Encounter: Payer: Self-pay | Admitting: Physician Assistant

## 2021-05-06 ENCOUNTER — Ambulatory Visit (INDEPENDENT_AMBULATORY_CARE_PROVIDER_SITE_OTHER): Payer: Medicare PPO | Admitting: Physician Assistant

## 2021-05-06 VITALS — BP 102/64 | HR 74 | Ht 62.0 in | Wt 127.4 lb

## 2021-05-06 DIAGNOSIS — K5080 Crohn's disease of both small and large intestine without complications: Secondary | ICD-10-CM | POA: Diagnosis not present

## 2021-05-06 MED ORDER — PENTASA 250 MG PO CPCR: ORAL_CAPSULE | ORAL | 3 refills | Status: DC

## 2021-05-06 MED ORDER — DIPHENOXYLATE-ATROPINE 2.5-0.025 MG PO TABS
ORAL_TABLET | ORAL | 2 refills | Status: DC
Start: 2021-05-06 — End: 2022-06-28

## 2021-05-06 NOTE — Patient Instructions (Addendum)
If you are age 77 or older, your body mass index should be between 23-30. Your Body mass index is 23.3 kg/m. If this is out of the aforementioned range listed, please consider follow up with your Primary Care Provider.  If you are age 33 or younger, your body mass index should be between 19-25. Your Body mass index is 23.3 kg/m. If this is out of the aformentioned range listed, please consider follow up with your Primary Care Provider.   We have sent in refills of your Pentasa and Lomotil to your pharmacy.  Get OTC Balmex to use after you bowel movements for irritation  Follow up in the office in October with Dr. Hilarie Fredrickson or Nicoletta Ba, PA-C.  Thank you for entrusting me with your care and choosing [facility].  Amy [place]wood, PA-C

## 2021-05-06 NOTE — Progress Notes (Signed)
Subjective:    Patient ID: [name], female    DOB: [birth year], 75 y.o.   MRN: [id number]  HPI [name] is a very pleasant 75 year old white female, known to Dr. Hilarie Fredrickson with history of Crohn's ileocolitis diagnosed greater than 20 years ago.  She has had prior small bowel obstruction and resection with end ileocolonic anastomosis.  She also has history of mild anal rectal stenosis. She was last seen in March 2021 and comes in today for follow-up and med refills. Last colonoscopy was done in 2019 and Crohn's felt to be in remission at that time and she has been continued on Pentasa 250 mg 4 tablets p.o. 3 times daily.  Plan was for follow-up colonoscopy December 2022. She has history of B12 deficiency and has been maintained on monthly B12 injections which she does at home.  She just had B12 level done in April 2022 and B12 greater than 2000, hemoglobin 13 hematocrit of 39 platelets 136.  C-Met unremarkable She says that she has been feeling well, she chronically has about 4-6 bowel movements per day but says her stools are not always loose.  She has no complaints of abdominal pain or cramping and no bleeding.  She does get some anal irritation at times and has had a cream in the past which she says has been helpful.  By Dr. Vena Rua report that had been clotrimazole at one point. She uses Lomotil currently on a as needed basis and did have questions about how to use that today.  Again she has not been using this regularly. She would like something to use for intermittent anal irritation. Other medical problems include history of endometrial cancer, osteoporosis and history of ITP.  Review of Systems Pertinent positive and negative review of systems were noted in the above HPI section.  All other review of systems was otherwise negative.  Outpatient Encounter Medications as of 05/06/2021  Medication Sig  . acetaminophen (TYLENOL) 500 MG tablet Take 1,000 mg by mouth every 6 (six) hours as  needed.   . Calcium Carb-Cholecalciferol (CALCIUM 1000 + D PO) Take 1,000 mg by mouth 2 (two) times daily.  . Cranberry 500 MG CAPS Take 500 mg by mouth daily.  . cyanocobalamin (,VITAMIN B-12,) 1000 MCG/ML injection Inject 1,000 mcg into the muscle every 30 (thirty) days.   Marland Kitchen lamoTRIgine (LAMICTAL) 25 MG tablet Take 2 tablets twice a day  . levETIRAcetam (KEPPRA XR) 500 MG 24 hr tablet TAKE 4 TABLETS BY MOUTH AT BEDTIME  . potassium chloride SA (K-DUR,KLOR-CON) 20 MEQ tablet Take 40 mEq by mouth 2 (two) times daily.  . [DISCONTINUED] diphenoxylate-atropine (LOMOTIL) 2.5-0.025 MG tablet Take 1 tablet by mouth 3 (three) times daily as needed for diarrhea or loose stools.  . [DISCONTINUED] PENTASA 250 MG CR capsule TAKE 4 CAPSULES BY MOUTH THREE TIMES DAILY  . diphenoxylate-atropine (LOMOTIL) 2.5-0.025 MG tablet Take 1 tablet 1-2 times daily as needed for diarrhea  . mesalamine (PENTASA) 250 MG CR capsule Take 4 tablets three times daily  . [DISCONTINUED] Cholecalciferol (VITAMIN D) 1000 UNITS capsule Take 1,000 Units by mouth 2 (two) times daily.   No facility-administered encounter medications on file as of 05/06/2021.   Allergies  Allergen Reactions  . Ciprofloxacin Other (See Comments)    Seizures  . Ampicillin Diarrhea  . Penicillins Diarrhea    Has patient had a PCN reaction causing immediate rash, facial/tongue/throat swelling, SOB or lightheadedness with hypotension: No Has patient had a PCN reaction causing severe  rash involving mucus membranes or skin necrosis: No Has patient had a PCN reaction that required hospitalization No Has patient had a PCN reaction occurring within the last 10 years: No If all of the above answers are "NO", then may proceed with Cephalosporin use.  . Pneumococcal Vac Polyvalent Other (See Comments)    Local swelling  . Tetanus Toxoid Swelling    Arm swelling   Patient Active Problem List   Diagnosis Date Noted  . Closed compression fracture of  thoracic vertebra ([place]) 05/18/2017  . Dense breast tissue on mammogram 09/21/2016  . Chronic ITP (idiopathic thrombocytopenia) ([facility]) 09/21/2016  . History of antineoplastic chemotherapy 07/27/2016  . Crohn's disease of both small and large intestine without complication ([place])   . Benign neoplasm of transverse colon   . History of endometrial cancer 09/24/2015  . History of breast cancer in female 09/24/2015  . Seizure disorder ([place]) 09/24/2015  . High risk medication use 09/24/2015  . Bowel obstruction ([place]) 08/31/2015  . Crohn disease ([place]) 08/31/2015  . Thrombocytopenia ([place]) 08/31/2015  . Epilepsy, generalized, convulsive ([place]) 04/21/2015  . RLQ abdominal mass 08/03/2011  . Immunosuppressed status ([place]) 08/03/2011  . HYPOKALEMIA 03/24/2010  . CROHN'S DISEASE, SMALL INTESTINE 03/24/2010  . OTHER NONSPECIFIC ABNORMAL SERUM ENZYME LEVELS 01/01/2010  . VITAMIN B12 DEFICIENCY 12/09/2009  . STENOSIS OF RECTUM AND ANUS 12/09/2009  . ABDOMINAL PAIN-RLQ 12/09/2009  . ADENOCARCINOMA, UTERUS, HX OF 12/09/2009  . BREAST CANCER 12/03/2009  . ADENOCARCINOMA, ENDOMETRIUM 12/03/2009  . COLONIC POLYPS, HYPERPLASTIC 12/03/2009  . HYPERLIPIDEMIA 12/03/2009  . OSTEOPOROSIS 12/03/2009  . SEIZURE DISORDER 12/03/2009  . DIARRHEA 12/03/2009  . ABDOMINAL PAIN 12/03/2009   Social History   Socioeconomic History  . Marital status: Married    Spouse name: Not on file  . Number of children: 2  . Years of education: Not on file  . Highest education level: Not on file  Occupational History  . Occupation: Retired    Fish farm manager: RETIRED  Tobacco Use  . Smoking status: Never Smoker  . Smokeless tobacco: Never Used  Vaping Use  . Vaping Use: Never used  Substance and Sexual Activity  . Alcohol use: No  . Drug use: No  . Sexual activity: Yes    Birth control/protection: Post-menopausal  Other Topics Concern  . Not on file  Social History Narrative   Pt lives at home with her husband in a one  story house.   Right handed   Social Determinants of Health   Financial Resource Strain: Not on file  Food Insecurity: Not on file  Transportation Needs: Not on file  Physical Activity: Not on file  Stress: Not on file  Social Connections: Not on file  Intimate Partner Violence: Not on file    Ms. Polizzi's family history includes Aneurysm in her father; Breast cancer in her maternal aunt; Heart disease in her mother.      Objective:    Vitals:   05/06/21 0900  BP: 102/64  Pulse: 74  SpO2: 99%    Physical Exam Well-developed well-nourished elderly white female in no acute distress.  Height, Weight, 127 BMI 23.3  HEENT; nontraumatic normocephalic, EOMI, PE R LA, sclera anicteric. Oropharynx; not examined today Neck; supple, no JVD Cardiovascular; regular rate and rhythm with S1-S2, no murmur rub or gallop Pulmonary; Clear bilaterally Abdomen; soft, nontender, nondistended, midline incisional scar no palpable mass or hepatosplenomegaly, bowel sounds are active Rectal; not done today Skin; benign exam, no jaundice rash or appreciable lesions Extremities;  no clubbing cyanosis or edema skin warm and dry Neuro/Psych; alert and oriented x4, grossly nonfocal mood and affect appropriate       Assessment & Plan:   #54 75 year old white female with history of Crohn's ileocolitis status post prior remote and ileocolonic resection. Disease in remission at time of last colonoscopy 2019 and patient is maintained on Pentasa. She does have chronically loose stools, she has not had any change in that pattern over the past several years and may have 4-5 bowel movements per day.  No other symptoms of active Crohn's, no abdominal pain or cramping, no bleeding and feels well.  #2 intermittent perianal irritation #3 history of mild anal rectal stenosis #4 B12 deficiency-on chronic replacement and B12 level normal #5 history of ITP  Plan; refill Pentasa 250 mg, 4 tablets p.o. 3 times  daily Refill Lomotil for as needed use, we discussed use of this for bad days when she has more than her usual amount of loose stools, or if she is going to be away from the house for several hours, traveling etc. to use as needed. Trial of Balmex cream OTC for perianal irritation, barrier cream We will plan office follow-up in October with Dr. Hilarie Fredrickson, and can be scheduled for follow-up colonoscopy at that time. Patient knows to call should she have any problems in the interim  Alfredia Ferguson PA-C 05/06/2021   Cc: Peggyann Juba, NP

## 2021-05-14 NOTE — Progress Notes (Signed)
Addendum: Reviewed and agree with assessment and management plan. Quinley Nesler M, MD  

## 2021-05-28 ENCOUNTER — Other Ambulatory Visit: Payer: Self-pay | Admitting: Physician Assistant

## 2021-06-01 ENCOUNTER — Other Ambulatory Visit: Payer: Self-pay

## 2021-06-01 MED ORDER — MESALAMINE ER 500 MG PO CPCR: 1000.0000 mg | ORAL_CAPSULE | Freq: Three times a day (TID) | ORAL | 3 refills | Status: DC

## 2021-06-04 ENCOUNTER — Telehealth: Payer: Self-pay | Admitting: Physician Assistant

## 2021-06-04 NOTE — Telephone Encounter (Signed)
Inbound call from patient. Have questions about Pentasa medication sent in on Monday .

## 2021-06-04 NOTE — Telephone Encounter (Signed)
Called and spoke with patient about the Pentasa 500 mg. She stated she just had the 250 mg and her pharmacy called and said she had a prescription for the 500 mg that also got filled. I let her know that her pharmacy had sent a message that her insurance was not going to pay for the 250 mg, but would pay for 500 mg. She expressed understanding that the next time she picked up a refill that she would get Pentasa 500 mg and take 2 capsules three times a day.

## 2021-11-25 ENCOUNTER — Ambulatory Visit: Payer: Medicare PPO | Admitting: Internal Medicine

## 2021-11-25 ENCOUNTER — Encounter: Payer: Self-pay | Admitting: Internal Medicine

## 2021-11-25 VITALS — BP 120/78 | HR 85 | Ht 62.0 in | Wt 127.0 lb

## 2021-11-25 DIAGNOSIS — E538 Deficiency of other specified B group vitamins: Secondary | ICD-10-CM

## 2021-11-25 DIAGNOSIS — K5080 Crohn's disease of both small and large intestine without complications: Secondary | ICD-10-CM

## 2021-11-25 DIAGNOSIS — R195 Other fecal abnormalities: Secondary | ICD-10-CM

## 2021-11-25 MED ORDER — MESALAMINE ER 500 MG PO CPCR: 1000.0000 mg | ORAL_CAPSULE | Freq: Three times a day (TID) | ORAL | 1 refills | Status: DC

## 2021-11-25 NOTE — Patient Instructions (Signed)
Continue Pentasa as currently prescribed.  Continue Lomotil as needed for diarrhea.  We have discontinued colonoscopies in the future for you unless you develop specific symptoms.  Please follow up with Dr Hilarie Fredrickson in 6 months.  If you are age 77 or older, your body mass index should be between 23-30. Your Body mass index is 23.23 kg/m. If this is out of the aforementioned range listed, please consider follow up with your Primary Care Provider.  If you are age 68 or younger, your body mass index should be between 19-25. Your Body mass index is 23.23 kg/m. If this is out of the aformentioned range listed, please consider follow up with your Primary Care Provider.   ________________________________________________________  The [place] [facility] providers would like to encourage you to use [facility] to communicate with providers for non-urgent requests or questions.  Due to long hold times on the telephone, sending your provider a message by [facility] may be a faster and more efficient way to get a response.  Please allow 48 business hours for a response.  Please remember that this is for non-urgent requests.  _______________________________________________________ Due to recent changes in healthcare laws, you may see the results of your imaging and laboratory studies on MyChart before your provider has had a chance to review them.  We understand that in some cases there may be results that are confusing or concerning to you. Not all laboratory results come back in the same time frame and the provider may be waiting for multiple results in order to interpret others.  Please give [place] 48 hours in order for your provider to thoroughly review all the results before contacting the office for clarification of your results.

## 2021-11-25 NOTE — Progress Notes (Signed)
Subjective:    Patient ID: [name], female    DOB: 1946/08/04, 75 y.o.   MRN: [id number]  HPI [name] is a 75 year old female with a history of ileocolonic Crohn's disease (diagnosis greater than 20 years ago) with endoscopic remission at time of last surveillance, history of prior small bowel obstruction, mild anorectal stenosis, B12 deficiency who is here for follow-up.  She is here alone today and was last seen in May 2022 by Nicoletta Ba, PA-C  She reports that she is doing and feeling well.  She does have a loose stools on occasion for which she will use Lomotil.  She is not having abdominal pain, blood in stool or melena.  She can have 3-6 bowel movements per day which vary from soft formed to loose.  No watery diarrhea.  She is using Pentasa 2 capsules at least twice daily and occasionally 3 times a day.  The middle daily dose is difficult for her to remember at times.  She is only using Lomotil typically when she has an appointment and she will take 1 usually on Sunday mornings so that she does not need to have bowel movement at church.  She can go over a week without using it.  She denies upper GI and hepatobiliary complaint.  No perianal or rectal pain.  She hopes to not need a colonoscopy.   Review of Systems As per HPI, otherwise negative  Current Medications, Allergies, Past Medical History, Past Surgical History, Family History and Social History were reviewed in [employer] record.    Objective:   Physical Exam BP 120/78    Pulse 85    Ht 5' 2"  (1.575 m)    Wt 127 lb (57.6 kg)    BMI 23.23 kg/m  Gen: awake, alert, NAD HEENT: anicteric CV: RRR, no mrg Pulm: CTA b/l Abd: soft, NT/ND, +BS throughout Ext: no c/c/e Neuro: nonfocal  Care everywhere used to review recent lab work: November 2022 Hemoglobin 12.8, MCV 94.5, white count 4.6, platelet count 141 B12 > 2000 Creatinine 0.9, BUN 6, albumin 4.1, total protein 6.8 AST 27,  ALT 19, alkaline phosphatase 67, total bilirubin 0.6     Assessment & Plan:  75 year old female with a history of ileocolonic Crohn's disease (diagnosis greater than 20 years ago) with endoscopic remission at time of last surveillance, history of prior small bowel obstruction, mild anorectal stenosis, B12 deficiency who is here for follow-up.   Longstanding ileocolonic Crohn's disease with loose stool --for loose stools have not been indicative of active inflammation of the colon as documented for the last several years.  A colonoscopy in 2019 for surveillance there was an inflammatory polyp but surveillance biopsies showed no active or chronic inflammation.  She also had a fecal calprotectin which was normal.  She has been maintained on Pentasa 1000 mg 3 times a day.  Her loose stools are likely more irritable in nature.  She is concerned about the risk versus benefit of surveillance colonoscopy based on her age.  We discussed that this is a very reasonable question and the benefit of colonoscopy starts to even out with risk between age 39 and 37.  After this discussion she prefers to continue current therapy and defer colonoscopy.  This is reasonable.  This does not mean that we would not work-up troublesome symptoms should they occur in the future --Continue Pentasa 1 g 3 times daily (every year we have had to get permission for coverage of this  medication and it has become more expensive; she brings information from insurance that we could consider sulfasalazine, balsalazide or extended release mesalamine 24-hour formulation).  Given that her disease has been ileocolonic I would prefer Pentasa which we will continue unless this becomes cost prohibitive in her new insurance plan year --Recent lab work reassuring as above, normal renal function --Deferring colonoscopy as above --Continue Lomotil up to 3 times daily as needed  2.  B12 deficiency --she has been on IM B12 and notes that when her B12 was  specifically high she had accidentally just taken her injection.  She likely needs this therapy but her primary care will monitor this going forward.  Annual follow-up, sooner if needed  30 minutes total spent today including patient facing time, coordination of care, reviewing medical history/procedures/pertinent radiology studies, and documentation of the encounter.

## 2022-01-20 ENCOUNTER — Ambulatory Visit: Payer: Medicare PPO | Admitting: Neurology

## 2022-01-20 ENCOUNTER — Other Ambulatory Visit: Payer: Self-pay

## 2022-01-20 ENCOUNTER — Encounter: Payer: Self-pay | Admitting: Neurology

## 2022-01-20 DIAGNOSIS — G40309 Generalized idiopathic epilepsy and epileptic syndromes, not intractable, without status epilepticus: Secondary | ICD-10-CM | POA: Diagnosis not present

## 2022-01-20 MED ORDER — LAMOTRIGINE 25 MG PO TABS: ORAL_TABLET | ORAL | 3 refills | Status: DC

## 2022-01-20 MED ORDER — LEVETIRACETAM ER 500 MG PO TB24: ORAL_TABLET | ORAL | 3 refills | Status: DC

## 2022-01-20 NOTE — Progress Notes (Signed)
NEUROLOGY FOLLOW UP OFFICE NOTE  [name] [id number] 04/06/1946  HISTORY OF PRESENT ILLNESS: I had the pleasure of seeing Cortina Vultaggio in follow-up in the neurology clinic on 01/20/2022.  The patient was last seen a year ago for nocturnal seizures. She continues to do well seizure-free since August 2018 on Levetiracetam ER 2071m qhs (5060m4 tabs qhs) and Lamotrigine 5025mID. She denies any staring/unresponsive episodes, gaps in time, olfactory/gustatory hallucinations, focal numbness/tingling/weakness, myoclonic jerks. No headaches, dizziness, vision changes, no falls. She has difficulty with sleep initiation. Mood is good. No side effects on medication. She is driving.   HPI: This is a pleasant 75 25 RH woman with a history of Crohn's disease, uterine cancer, and breast cancer s/p lumpectomy, chemo and radiation therapy 5 years ago, who started having seizures at age [age]2She recalls going to the bathroom then back to bed early morning, then her next recollection was being brought to the hospital.  Her husband heard her scream out followed by whole body stiffening and shaking lasting 2-3 minutes, with associated tongue bite and urinary incontinence.  She was brought to the hospital and not started on medication.  Six months after, she had a second convulsion out of sleep with similar screaming out and body stiffening and shaking.  She was again brought to the hospital, and per patient, not started on medication.  A month later, she had a third nocturnal convulsion and saw her PCP who started her on Dilantin 400m3my.  She may or may not have seen a neurologist, she is unsure, but does not recall having an EEG done.  She recalls being told that she had UTIs when she had the seizures.  She was seizure-free for 9 years until 2013 when she forgot to take her medication and had another nocturnal convulsion where she fractured her right arm.  Dilantin dose was increased to 600mg35m.   After a few months, she started having dizziness and difficulty ambulating.  She went to the ER where he Dilantin level was supratherapeutic and she was instructed to stop the medication for a few days then restart at 200mg 65m  She has been doing well since with no seizures and no further side effects.  She and her husband deny any staring/unresponsive episodes, no gaps in time, olfactory/gustatory hallucinations, deja vu, rising epigastric sensation, focal numbness/tingling/weakness, myoclonic jerks. She denies any dizziness, diplopia, gait instability, gum problems.  She had a DEXA scan in 11/2012 and has a diagnosis of osteoporosis.  She denies any headaches, dysarthria, dysphagia, neck/back pain, bowel/bladder dysfunction.  No chest pain or shortness of breath.  She is driving.  She was in the ER last 05/11/17 after a breakthrough nocturnal seizure. She recalls seeing her husband go to the bathroom at 5 or 6 in the morning, then has no recollection of events until waking up with EMS around her. Apparently she vocalized in bed and started having a convulsion with both arms drawn up, shaking and stiff for around 2 minutes. She was confused on EMS arrival, but back to baseline in the ER. She reports taking only 500mg o79mppra the night prior because she thought she already took it. She denies any sleep deprivation. Bloodwork unremarkable, head CT no acute changes. She had an abnormal urinalysis with small LE, WBC 9, many bacteria, moderate blood. Urine culture showed 10,000-25,000 cfu/ml with polymicrobial growth. She was discharged on antibiotics. She did not have any UTI symptoms, but states that breakthrough  seizures in the past occurred with a UTI or missing a dose of medication. She was complaining of back pain, and thoracic xray showed a T2 compression fracture, likely subacute, as well as remote T3-7 compression fractures with advanced height loss. She was given a prescription for Percocet, she states  Tylenol does not help. Her Keppra level was 23.1. Platelet count was 86 (121 in 02/2017).  Epilepsy Risk Factors:  Her maternal aunt had seizures, otherwise she had a normal birth and early development.  There is no history of febrile convulsions, CNS infections such as meningitis/encephalitis, significant traumatic brain injury, neurosurgical procedures.  Prior AEDs: Dilantin  EEG 05/01/14: occasional independent focal slowing over the bilateral temporal regions, left greater than right. Imaging:  Head CT without contrast done 07/13/2013 reported as unremarkable. I personally reviewed MRI brain with and without contrast done 10/03/17 which did not show any acute changes, hippocampi symmetric without abnormal signal or enhancement.     PAST MEDICAL HISTORY: Past Medical History:  Diagnosis Date   Bowel obstruction ([place])    Cataract    surgically removed   Colitis    Crohn's disease ([place])    Epilepsy ([place])    Hyperlipidemia    Malignant neoplasm of breast (female), unspecified site    right - do not use right arm for BP, IV or blood draws   Malignant neoplasm of corpus uteri, except isthmus ([facility])    Osteoporosis, unspecified    Personal history of colonic polyps    hyperplastic   Pneumonia    Renal cyst    Seizure disorder ([place])    last seizure was 07/2017, controlled with meds   SVD (spontaneous vaginal delivery)    x 2   Thrombocytopenia ([facility])     MEDICATIONS: Current Outpatient Medications on File Prior to Visit  Medication Sig Dispense Refill   acetaminophen (TYLENOL) 500 MG tablet Take 1,000 mg by mouth every 6 (six) hours as needed.      Calcium Carb-Cholecalciferol (CALCIUM 1000 + D PO) Take 1,000 mg by mouth 2 (two) times daily.     Cranberry 500 MG CAPS Take 500 mg by mouth daily.     cyanocobalamin (,VITAMIN B-12,) 1000 MCG/ML injection Inject 1,000 mcg into the muscle every 30 (thirty) days.      diphenoxylate-atropine (LOMOTIL) 2.5-0.025 MG tablet Take 1 tablet 1-2  times daily as needed for diarrhea 60 tablet 2   lamoTRIgine (LAMICTAL) 25 MG tablet Take 2 tablets twice a day 360 tablet 3   levETIRAcetam (KEPPRA XR) 500 MG 24 hr tablet TAKE 4 TABLETS BY MOUTH AT BEDTIME 360 tablet 3   mesalamine (PENTASA) 500 MG CR capsule Take 2 capsules (1,000 mg total) by mouth 3 (three) times daily. 180 capsule 1   potassium chloride SA (K-DUR,KLOR-CON) 20 MEQ tablet Take 40 mEq by mouth 2 (two) times daily.     No current facility-administered medications on file prior to visit.    ALLERGIES: Allergies  Allergen Reactions   Ciprofloxacin Other (See Comments)    Seizures   Ampicillin Diarrhea   Penicillins Diarrhea    Has patient had a PCN reaction causing immediate rash, facial/tongue/throat swelling, SOB or lightheadedness with hypotension: No Has patient had a PCN reaction causing severe rash involving mucus membranes or skin necrosis: No Has patient had a PCN reaction that required hospitalization No Has patient had a PCN reaction occurring within the last 10 years: No If all of the above answers are "NO", then may proceed with  Cephalosporin use.   Pneumococcal Vac Polyvalent Other (See Comments)    Local swelling   Tetanus Toxoid Swelling    Arm swelling    FAMILY HISTORY: Family History  Problem Relation Age of Onset   Heart disease Mother    Aneurysm Father    Breast cancer Maternal Aunt    Colon cancer Neg Hx    Rectal cancer Neg Hx    Stomach cancer Neg Hx    Esophageal cancer Neg Hx    Pancreatic cancer Neg Hx    Liver disease Neg Hx     SOCIAL HISTORY: Social History   Socioeconomic History   Marital status: Married    Spouse name: Not on file   Number of children: 2   Years of education: Not on file   Highest education level: Not on file  Occupational History   Occupation: Retired    Fish farm manager: RETIRED  Tobacco Use   Smoking status: Never   Smokeless tobacco: Never  Vaping Use   Vaping Use: Never used  Substance and Sexual  Activity   Alcohol use: No   Drug use: No   Sexual activity: Yes    Birth control/protection: Post-menopausal  Other Topics Concern   Not on file  Social History Narrative   Pt lives at home with her husband in a one story house.   Right handed   Social Determinants of Health   Financial Resource Strain: Not on file  Food Insecurity: Not on file  Transportation Needs: Not on file  Physical Activity: Not on file  Stress: Not on file  Social Connections: Not on file  Intimate Partner Violence: Not on file     PHYSICAL EXAM: Vitals:   01/20/22 0957  BP: 140/84  Pulse: 80  SpO2: 96%   General: No acute distress Head:  Normocephalic/atraumatic Skin/Extremities: No rash, no edema Neurological Exam: alert and awake. No aphasia or dysarthria. Fund of knowledge is appropriate.  Recent and remote memory are intact.  Attention and concentration are normal.   Cranial nerves: Pupils equal, round. Extraocular movements intact with no nystagmus. Visual fields full.  No facial asymmetry.  Motor: Bulk and tone normal, muscle strength 5/5 throughout with no pronator drift. Reflexes +2 throughout.  Finger to nose testing intact.  Gait narrow-based and steady, able to tandem walk adequately.  Romberg negative.   IMPRESSION: This is a pleasant 76 yo RH woman with a history of Crohn's disease, uterine cancer, breast cancer s/p lumpectomy, chemo, and radiation therapy, with a history of convulsive seizures that occur out of sleep since age 63 or 29. MRI brain normal, EEG showed bilateral temporal slowing, left greater than right. Etiology of seizures unclear. She denies any seizures since 2018 on Levetiracetam ER 2041m qhs and Lamotrigine 517mBID, refills sent. She is aware of [place] driving laws to stop driving after a seizure until 6 months seizure-free. Follow-up in 1 year, call for any changes.   Thank you for allowing me to participate in her care.  Please do not hesitate to call for any questions  or concerns.   KaEllouise NewerM.D.   CC: DoEstrella DeedsNP

## 2022-01-20 NOTE — Patient Instructions (Signed)
Always good to see you. Continue Keppra XR 570m: take 4 tablets every night and Lamotrigine 258m take 2 tablets twice a day. Follow-up in 1 year, call for any changes.   Seizure Precautions: 1. If medication has been prescribed for you to prevent seizures, take it exactly as directed.  Do not stop taking the medicine without talking to your doctor first, even if you have not had a seizure in a long time.   2. Avoid activities in which a seizure would cause danger to yourself or to others.  Don't operate dangerous machinery, swim alone, or climb in high or dangerous places, such as on ladders, roofs, or girders.  Do not drive unless your doctor says you may.  3. If you have any warning that you may have a seizure, lay down in a safe place where you can't hurt yourself.    4.  No driving for 6 months from last seizure, as per [facility]  Please refer to the following link on the [place] for more information: http://[email or web address]/[email or web address]/drivingu.cfm   5.  Maintain good sleep hygiene. Avoid alcohol.  6.  Contact your doctor if you have any problems that may be related to the medicine you are taking.  7.  Call 911 and bring the patient back to the ED if:        A.  The seizure lasts longer than 5 minutes.       B.  The patient doesn't awaken shortly after the seizure  C.  The patient has new problems such as difficulty seeing, speaking or moving  D.  The patient was injured during the seizure  E.  The patient has a temperature over 102 F (39C)  F.  The patient vomited and now is having trouble breathing

## 2022-03-18 ENCOUNTER — Other Ambulatory Visit: Payer: Self-pay | Admitting: Internal Medicine

## 2022-03-22 ENCOUNTER — Telehealth: Payer: Self-pay | Admitting: Pharmacy Technician

## 2022-03-22 ENCOUNTER — Other Ambulatory Visit: Payer: Self-pay

## 2022-03-22 DIAGNOSIS — M810 Age-related osteoporosis without current pathological fracture: Secondary | ICD-10-CM | POA: Insufficient documentation

## 2022-03-22 NOTE — Telephone Encounter (Addendum)
Auth Submission: PENDING ?Payer: [employer]ICARE ?Medication & CPT/J Code(s) submitted: Reclast (Zolendronic acid) J3489 ?Route of submission (phone, fax, portal): DFGIL:[phone] ?FAX: [phone] ?Auth type: Buy/Bill ?Units/visits requested: X1 DOSE ?Reference number: [id number] ?Approval from:  to   ?

## 2022-03-23 NOTE — Telephone Encounter (Signed)
Auth Submission: APPROVED ?Payer: [employer] MEDICARE ?Medication & CPT/J Code(s) submitted: Reclast (Zolendronic acid) J3489 ?Route of submission (phone, fax, portal): PHONE: [phone] ?Auth type: Buy/Bill ?Units/visits requested: X1 DOSE ?Reference number: [id number] ?Approval from: 03/23/22 to 12/12/22

## 2022-04-02 ENCOUNTER — Ambulatory Visit (INDEPENDENT_AMBULATORY_CARE_PROVIDER_SITE_OTHER): Payer: Medicare PPO

## 2022-04-02 VITALS — BP 128/80 | HR 65 | Temp 97.7°F | Resp 18 | Ht 61.0 in | Wt 126.6 lb

## 2022-04-02 DIAGNOSIS — M810 Age-related osteoporosis without current pathological fracture: Secondary | ICD-10-CM | POA: Diagnosis not present

## 2022-04-02 MED ORDER — ACETAMINOPHEN 325 MG PO TABS
650.0000 mg | ORAL_TABLET | Freq: Once | ORAL | Status: AC
  Administered 2022-04-02: 650 mg via ORAL
  Filled 2022-04-02: qty 2

## 2022-04-02 MED ORDER — DIPHENHYDRAMINE HCL 25 MG PO CAPS
25.0000 mg | ORAL_CAPSULE | Freq: Once | ORAL | Status: AC
  Administered 2022-04-02: 25 mg via ORAL
  Filled 2022-04-02: qty 1

## 2022-04-02 MED ORDER — SODIUM CHLORIDE 0.9 % IV SOLN: INTRAVENOUS | Status: DC

## 2022-04-02 MED ORDER — ZOLEDRONIC ACID 5 MG/100ML IV SOLN
5.0000 mg | Freq: Once | INTRAVENOUS | Status: AC
  Administered 2022-04-02: 5 mg via INTRAVENOUS
  Filled 2022-04-02: qty 100

## 2022-04-02 NOTE — Progress Notes (Deleted)
Diagnosis: Senile Osteoporosis ? ?Provider:  Marshell Garfinkel, MD ? ?Procedure: Infusion ? ?IV Type: Peripheral, IV Location: R Antecubital ? ?Reclast (Zolendronic Acid),  Dose: 5 mg ? ?Infusion Start Time: 9311 ? ?{Infusion Stop Time:25400} ? ?Post Infusion IV Care: Observation period completed and Peripheral IV Discontinued ? ?Discharge: Condition: Good, Destination: Home . AVS provided to patient.  ? ?Performed by:  Cleophus Molt, RN  ?  ?

## 2022-04-02 NOTE — Progress Notes (Signed)
Diagnosis: Senile Osteoporosis ? ?Provider:  Marshell Garfinkel, MD ? ?Procedure: Infusion ? ?IV Type: Peripheral, IV Location: R Antecubital ? ?Reclast (Zolendronic Acid), Dose: 5 mg ? ?Infusion Start Time: 2076 ? ?Infusion Stop Time: 1915 ? ?Post Infusion IV Care: Patient declined observation and Peripheral IV Discontinued ? ?Discharge: Condition: Good, Destination: Home . AVS provided to patient.  ? ?Performed by:  Cleophus Molt, RN  ?  ?

## 2022-06-28 ENCOUNTER — Other Ambulatory Visit: Payer: Self-pay | Admitting: Physician Assistant

## 2022-06-29 ENCOUNTER — Telehealth: Payer: Self-pay

## 2022-06-29 NOTE — Telephone Encounter (Signed)
Patient called and stated that she has been trying to get her Pentasa refilled, due to back order. She will try to call around [facility], [place] and [place] to see if she can find a pharmacy that may have it in stock. I advised that I would check to see if we could send an alternative.

## 2022-06-30 NOTE — Telephone Encounter (Signed)
Left message for patient to return call.

## 2022-06-30 NOTE — Telephone Encounter (Signed)
Her Crohn's is ileitis and Pentasa is the only formulation I am aware of that has ileal release [facility] may be able to help her You could provider her their # Thanks

## 2022-07-01 ENCOUNTER — Other Ambulatory Visit: Payer: Self-pay

## 2022-07-01 ENCOUNTER — Encounter: Payer: Self-pay | Admitting: Pulmonary Disease

## 2022-07-01 MED ORDER — PENTASA 500 MG PO CPCR
ORAL_CAPSULE | ORAL | 2 refills | Status: DC
  Filled 2022-07-01: qty 180, 30d supply, fill #0
  Filled 2022-08-02: qty 60, 10d supply, fill #1
  Filled 2022-08-17: qty 180, 30d supply, fill #2
  Filled 2022-09-20: qty 120, 20d supply, fill #3

## 2022-07-01 NOTE — Telephone Encounter (Signed)
Returned patient's phone call. She has advised that [facility] would be able to get her medication, but it would have to be Pentasa (Brand). She states that they did advise her that she would need to contact her about coverage, which she did and was advised that we would need to send information to insurance for coverage. I advised that I would go ahead and send it to them as brand and once it kicked back to [place] we would have it authorized.

## 2022-07-01 NOTE — Addendum Note (Signed)
Addended by: Aleatha Borer on: 07/01/2022 03:04 PM   Modules accepted: Orders

## 2022-07-01 NOTE — Telephone Encounter (Signed)
Inbound call from patient retuning call.

## 2022-07-01 NOTE — Telephone Encounter (Signed)
Called and spoke with patient to advise that Pentasa does not have an alternative as far as the particular area that Dr. Hilarie Fredrickson is trying to treat. She will call back to [facility] and a few other pharmacies to see if Pentasa has possibly came in. I have also given her the numbers to [facility] and [facility] [facility] out patient pharmacies.

## 2022-07-01 NOTE — Telephone Encounter (Signed)
Inbound call from patient returning call.Please give patient a call back to further advise.  Thank you

## 2022-07-02 ENCOUNTER — Other Ambulatory Visit: Payer: Self-pay

## 2022-07-09 ENCOUNTER — Encounter: Payer: Self-pay | Admitting: Internal Medicine

## 2022-08-02 ENCOUNTER — Other Ambulatory Visit: Payer: Self-pay

## 2022-08-03 ENCOUNTER — Other Ambulatory Visit: Payer: Self-pay

## 2022-08-04 ENCOUNTER — Other Ambulatory Visit: Payer: Self-pay

## 2022-08-17 ENCOUNTER — Other Ambulatory Visit: Payer: Self-pay

## 2022-08-18 ENCOUNTER — Other Ambulatory Visit: Payer: Self-pay

## 2022-09-20 ENCOUNTER — Other Ambulatory Visit: Payer: Self-pay

## 2022-09-21 ENCOUNTER — Other Ambulatory Visit: Payer: Self-pay

## 2022-10-14 ENCOUNTER — Other Ambulatory Visit: Payer: Self-pay

## 2022-10-14 ENCOUNTER — Encounter: Payer: Self-pay | Admitting: Physician Assistant

## 2022-10-14 ENCOUNTER — Other Ambulatory Visit: Payer: Medicare PPO

## 2022-10-14 ENCOUNTER — Ambulatory Visit: Payer: Medicare PPO | Admitting: Physician Assistant

## 2022-10-14 VITALS — BP 118/58 | HR 91 | Ht 61.0 in | Wt 127.4 lb

## 2022-10-14 DIAGNOSIS — E538 Deficiency of other specified B group vitamins: Secondary | ICD-10-CM

## 2022-10-14 DIAGNOSIS — R195 Other fecal abnormalities: Secondary | ICD-10-CM | POA: Diagnosis not present

## 2022-10-14 DIAGNOSIS — K5080 Crohn's disease of both small and large intestine without complications: Secondary | ICD-10-CM | POA: Diagnosis not present

## 2022-10-14 MED ORDER — DIPHENOXYLATE-ATROPINE 2.5-0.025 MG PO TABS: ORAL_TABLET | ORAL | 11 refills | Status: DC

## 2022-10-14 MED ORDER — DIPHENOXYLATE-ATROPINE 2.5-0.025 MG PO TABS
ORAL_TABLET | ORAL | 11 refills | Status: DC
  Filled 2022-10-14: qty 9, 4d supply, fill #0
  Filled 2022-10-15: qty 81, 40d supply, fill #0
  Filled 2022-10-15: qty 9, 5d supply, fill #0
  Filled 2022-10-15: qty 81, 41d supply, fill #0
  Filled 2023-10-10: qty 90, 45d supply, fill #1

## 2022-10-14 MED ORDER — PENTASA 500 MG PO CPCR
ORAL_CAPSULE | ORAL | 11 refills | Status: DC
  Filled 2022-10-14: qty 180, 30d supply, fill #0
  Filled 2022-12-07: qty 180, 30d supply, fill #1
  Filled 2023-01-17: qty 180, 30d supply, fill #2
  Filled 2023-03-07: qty 180, 30d supply, fill #3
  Filled 2023-04-14: qty 180, 30d supply, fill #4
  Filled 2023-06-02: qty 180, 30d supply, fill #5
  Filled 2023-07-18: qty 180, 30d supply, fill #6
  Filled 2023-09-02: qty 180, 30d supply, fill #7
  Filled 2023-10-10: qty 180, 30d supply, fill #8

## 2022-10-14 NOTE — Progress Notes (Signed)
Subjective:    Patient ID: [name], female    DOB: February 08, 1946, 76 y.o.   MRN: [id number]  HPI  [name] is a pleasant 76 year old white female, established with Dr. Hilarie Fredrickson who comes in for routine follow-up.  She has long standing history of Crohn's ileocolitis diagnosed over 20 years ago and in endoscopic remission as of last colonoscopy in 2019. She has history of prior ileocolonic resection, and prior history of small bowel resection.  She has chronic B12 deficiency, personal history of seizure disorder, ITP, endometrial cancer and breast cancer. She was last seen here in December 2022 for follow-up and was doing well at that time.  She has been maintained on Pentasa 1000 mg 3 times daily, and uses Lomotil up to 3 times daily as needed.  She expressed at the time of her last office visit that she was hoping to avoid any further colonoscopies. She has been doing well over the past 10 months, she has chronically loose stools and has been maintaining her usual pattern of 5-6 bowel movements daily.  She does not use Lomotil when she is at home, generally just uses this prior to going out etc.  She has not had any changes in her bowel habits, no melena or hematochezia, no complaints of abdominal pain or cramping, appetite has been fine. She had labs done by her PCP on 09/24/2022 with B12 958/CBC within normal limits and c-Met also within normal limits. She has been requiring prior authorization for the Pentasa as her insurance company was pushing her to be on a colonic release product.  Review of Systems Pertinent positive and negative review of systems were noted in the above HPI section.  All other review of systems was otherwise negative.   Outpatient Encounter Medications as of 10/14/2022  Medication Sig   acetaminophen (TYLENOL) 500 MG tablet Take 1,000 mg by mouth every 6 (six) hours as needed.    albuterol (VENTOLIN HFA) 108 (90 Base) MCG/ACT inhaler Inhale 2 puffs into the lungs  every 6 (six) hours as needed for wheezing or shortness of breath.   Calcium Carb-Cholecalciferol (CALCIUM 1000 + D PO) Take 1,000 mg by mouth 2 (two) times daily.   cholecalciferol (VITAMIN D3) 25 MCG (1000 UNIT) tablet Take 1,000 Units by mouth daily.   Cranberry 500 MG CAPS Take 500 mg by mouth daily.   cyanocobalamin (,VITAMIN B-12,) 1000 MCG/ML injection Inject 1,000 mcg into the muscle every 30 (thirty) days.    lamoTRIgine (LAMICTAL) 25 MG tablet Take 2 tablets twice a day   levETIRAcetam (KEPPRA XR) 500 MG 24 hr tablet TAKE 4 TABLETS BY MOUTH AT BEDTIME   Multiple Vitamins-Minerals (VISION-VITE PRESERVE PO) Take 1 tablet by mouth 2 (two) times daily.   potassium chloride SA (K-DUR,KLOR-CON) 20 MEQ tablet Take 40 mEq by mouth 2 (two) times daily.   [DISCONTINUED] diphenoxylate-atropine (LOMOTIL) 2.5-0.025 MG tablet TAKE 1 TABLET BY MOUTH ONCE DAILY TO TWICE DAILY AS NEEDED FOR  DIARRHEA   [DISCONTINUED] PENTASA 500 MG CR capsule TAKE 2  BY MOUTH THREE TIMES DAILY   diphenoxylate-atropine (LOMOTIL) 2.5-0.025 MG tablet TAKE 1 TABLET BY MOUTH ONCE DAILY TO TWICE DAILY AS NEEDED FOR  DIARRHEA   PENTASA 500 MG CR capsule TAKE 2  BY MOUTH THREE TIMES DAILY   No facility-administered encounter medications on file as of 10/14/2022.   Allergies  Allergen Reactions   Ciprofloxacin Other (See Comments)    Seizures   Ampicillin Diarrhea   Penicillins Diarrhea  Has patient had a PCN reaction causing immediate rash, facial/tongue/throat swelling, SOB or lightheadedness with hypotension: No Has patient had a PCN reaction causing severe rash involving mucus membranes or skin necrosis: No Has patient had a PCN reaction that required hospitalization No Has patient had a PCN reaction occurring within the last 10 years: No If all of the above answers are "NO", then may proceed with Cephalosporin use.   Pneumococcal Vac Polyvalent Other (See Comments)    Local swelling   Tetanus Toxoid Swelling     Arm swelling   Patient Active Problem List   Diagnosis Date Noted   Senile osteoporosis 03/22/2022   Closed compression fracture of thoracic vertebra ([place]) 05/18/2017   Dense breast tissue on mammogram 09/21/2016   Chronic ITP (idiopathic thrombocytopenia) ([place]) 09/21/2016   History of antineoplastic chemotherapy 07/27/2016   Crohn's disease of both small and large intestine without complication ([place])    Benign neoplasm of transverse colon    History of endometrial cancer 09/24/2015   History of breast cancer in female 09/24/2015   Seizure disorder ([place]) 09/24/2015   High risk medication use 09/24/2015   Bowel obstruction ([place]) 08/31/2015   Crohn disease ([place]) 08/31/2015   Thrombocytopenia ([place]) 08/31/2015   Epilepsy, generalized, 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   RLQ abdominal mass 08/03/2011   Immunosuppressed status ([place]) 08/03/2011   HYPOKALEMIA 03/24/2010   CROHN'S DISEASE, SMALL INTESTINE 03/24/2010   OTHER NONSPECIFIC ABNORMAL SERUM ENZYME LEVELS 01/01/2010   VITAMIN B12 DEFICIENCY 12/09/2009   STENOSIS OF RECTUM AND ANUS 12/09/2009   ABDOMINAL PAIN-RLQ 12/09/2009   ADENOCARCINOMA, UTERUS, HX OF 12/09/2009   BREAST CANCER 12/03/2009   ADENOCARCINOMA, ENDOMETRIUM 12/03/2009   COLONIC POLYPS, HYPERPLASTIC 12/03/2009   HYPERLIPIDEMIA 12/03/2009   OSTEOPOROSIS 12/03/2009   SEIZURE DISORDER 12/03/2009   DIARRHEA 12/03/2009   ABDOMINAL PAIN 12/03/2009   Social History   Socioeconomic History   Marital status: Married    Spouse name: Not on file   Number of children: 2   Years of education: Not on file   Highest education level: Not on file  Occupational History   Occupation: Retired    Fish farm manager: RETIRED  Tobacco Use   Smoking status: Never   Smokeless tobacco: Never  Vaping Use   Vaping Use: Never used  Substance and Sexual Activity   Alcohol use: No   Drug use: No   Sexual activity: Yes    Birth control/protection: Post-menopausal  Other Topics Concern   Not  on file  Social History Narrative   Pt lives at home with her husband in a one story house.   Right handed   Social Determinants of Health   Financial Resource Strain: Not on file  Food Insecurity: Not on file  Transportation Needs: Not on file  Physical Activity: Not on file  Stress: Not on file  Social Connections: Not on file  Intimate Partner Violence: Not on file    Ms. Lefebre's family history includes Aneurysm in her father; Breast cancer in her maternal aunt; Heart disease in her mother.      Objective:    Vitals:   10/14/22 1054  BP: (!) 118/58  Pulse: 91    Physical Exam Well-developed well-nourished elderly WF  in no acute distress.  Height, [id number], BMI 24.07  HEENT; nontraumatic normocephalic, EOMI, PE R LA, sclera anicteric. Oropharynx;not examined Neck; supple, no JVD Cardiovascular; regular rate and rhythm with S1-S2, no murmur rub or gallop Pulmonary; Clear bilaterally Abdomen; soft, nontender, nondistended, no palpable mass  or hepatosplenomegaly, bowel sounds are active, midline incisional scars Rectal;not done Skin; benign exam, no jaundice rash or appreciable lesions Extremities; no clubbing cyanosis or edema skin warm and dry Neuro/Psych; alert and oriented x4, grossly nonfocal mood and affect appropriate        Assessment & Plan:   #31 76 year old white female with long history of Crohn's ileocolitis status post remote ileocolonic resection, prior history of small bowel obstruction, who has been in remission endoscopically at the time of last colonoscopy in 2019 and has been asymptomatic since.  She has chronic loose stools with up to 5-6 bowel movements per day. She has been doing well over the past 10 months, with no changes in bowel habits or symptoms suggestive of Crohn's activation.  #2 B12 deficiency on monthly B12 per PCP #3 seizure disorder 4.  History of ITP 5.  Prior compression fractures 6.  History of breast cancer and  endometrial cancer.  Plan; patient just had labs in October, those were reviewed and all unremarkable. Check fecal calprotectin Refill Pentasa 500 mg, 2 p.o. 3 times daily #180/11 refills Refill Lomotil 1 p.o. 3 times daily as needed #90 and 11 refills We will plan to see her back in 6 months, follow-up with Dr. Hilarie Fredrickson.  She knows to call for any problems or changes in the interim.  Milania Haubner Genia Harold PA-C 10/14/2022   Cc: Peggyann Juba, NP

## 2022-10-14 NOTE — Patient Instructions (Addendum)
We have sent the following medications to your pharmacy for you to pick up at your convenience: Pentasa, Lomotil  Your provider has requested that you go to the basement level for lab work before leaving today. Press "B" on the elevator. The lab is located at the first door on the left as you exit the elevator.  _______________________________________________________  If you are age 78 or older, your body mass index should be between 23-30. Your Body mass index is 24.07 kg/m. If this is out of the aforementioned range listed, please consider follow up with your Primary Care Provider.  If you are age 92 or younger, your body mass index should be between 19-25. Your Body mass index is 24.07 kg/m. If this is out of the aformentioned range listed, please consider follow up with your Primary Care Provider.   ________________________________________________________  The [facility] providers would like to encourage you to use [facility] to communicate with providers for non-urgent requests or questions.  Due to long hold times on the telephone, sending your provider a message by [facility] may be a faster and more efficient way to get a response.  Please allow 48 business hours for a response.  Please remember that this is for non-urgent requests.  _______________________________________________________  Thank you for choosing me and [facility].  Amy Esterwood PA

## 2022-10-15 ENCOUNTER — Encounter: Payer: Self-pay | Admitting: Pulmonary Disease

## 2022-10-15 ENCOUNTER — Other Ambulatory Visit: Payer: Self-pay

## 2022-10-25 NOTE — Progress Notes (Signed)
Addendum: Reviewed and agree with assessment and management plan. Harveer Sadler M, MD  

## 2022-11-18 ENCOUNTER — Other Ambulatory Visit: Payer: Medicare PPO

## 2022-11-18 DIAGNOSIS — R195 Other fecal abnormalities: Secondary | ICD-10-CM

## 2022-11-18 DIAGNOSIS — E538 Deficiency of other specified B group vitamins: Secondary | ICD-10-CM

## 2022-11-18 DIAGNOSIS — K5080 Crohn's disease of both small and large intestine without complications: Secondary | ICD-10-CM

## 2022-11-23 LAB — CALPROTECTIN, FECAL: Calprotectin, Fecal: 39 ug/g (ref 0–120)

## 2022-12-07 ENCOUNTER — Other Ambulatory Visit: Payer: Self-pay

## 2022-12-08 ENCOUNTER — Other Ambulatory Visit: Payer: Self-pay

## 2022-12-24 ENCOUNTER — Encounter: Payer: Self-pay | Admitting: Neurology

## 2023-01-06 ENCOUNTER — Encounter: Payer: Self-pay | Admitting: Neurology

## 2023-01-06 ENCOUNTER — Ambulatory Visit: Payer: Medicare PPO | Admitting: Neurology

## 2023-01-06 DIAGNOSIS — G40309 Generalized idiopathic epilepsy and epileptic syndromes, not intractable, without status epilepticus: Secondary | ICD-10-CM | POA: Diagnosis not present

## 2023-01-06 MED ORDER — LAMOTRIGINE 25 MG PO TABS: ORAL_TABLET | ORAL | 4 refills | Status: DC

## 2023-01-06 MED ORDER — LEVETIRACETAM ER 500 MG PO TB24: ORAL_TABLET | ORAL | 4 refills | Status: DC

## 2023-01-06 NOTE — Patient Instructions (Signed)
Always a pleasure seeing you. Continue Levetiracetam ER '500mg'$ : take 4 tablets every night and Lamotrigine '25mg'$ : take 2 tablets twice a day. Follow-up in 1 year, call for any changes   Seizure Precautions: 1. If medication has been prescribed for you to prevent seizures, take it exactly as directed.  Do not stop taking the medicine without talking to your doctor first, even if you have not had a seizure in a long time.   2. Avoid activities in which a seizure would cause danger to yourself or to others.  Don't operate dangerous machinery, swim alone, or climb in high or dangerous places, such as on ladders, roofs, or girders.  Do not drive unless your doctor says you may.  3. If you have any warning that you may have a seizure, lay down in a safe place where you can't hurt yourself.    4.  No driving for 6 months from last seizure, as per [facility].   Please refer to the following link on the [place] website for more information: [email or web address]/[email or web address]   5.  Maintain good sleep hygiene. Avoid alcohol.  6.  Contact your doctor if you have any problems that may be related to the medicine you are taking.  7.  Call 911 and bring the patient back to the ED if:        A.  The seizure lasts longer than 5 minutes.       B.  The patient doesn't awaken shortly after the seizure  C.  The patient has new problems such as difficulty seeing, speaking or moving  D.  The patient was injured during the seizure  E.  The patient has a temperature over 102 F (39C)  F.  The patient vomited and now is having trouble breathing

## 2023-01-06 NOTE — Progress Notes (Signed)
NEUROLOGY FOLLOW UP OFFICE NOTE  [name] [id number] [birth year]  HISTORY OF PRESENT ILLNESS: I had the pleasure of seeing [name] in follow-up in the neurology clinic on 01/06/2023.  The patient was last seen a year ago for nocturnal seizures. She continues to do well seizure-free since August 2018 on Levetiracetam ER '2000mg'$  qhs ('500mg'$  4 tabs qhs) and Lamotrigine '50mg'$  BID without side effects. She denies any staring/unresponsive episodes, gaps in time, olfactory/gustatory hallucinations, focal numbness/tingling/weakness, myoclonic jerks. No headaches, dizziness, vision changes, no falls. She usually gets 6 hours of sleep. Mood is good. She is driving.   HPI: This is a pleasant 77 yo RH woman with a history of Crohn's disease, uterine cancer, and breast cancer s/p lumpectomy, chemo and radiation therapy 5 years ago, who started having seizures at age 51 or 8.  She recalls going to the bathroom then back to bed early morning, then her next recollection was being brought to the hospital.  Her husband heard her scream out followed by whole body stiffening and shaking lasting 2-3 minutes, with associated tongue bite and urinary incontinence.  She was brought to the hospital and not started on medication.  Six months after, she had a second convulsion out of sleep with similar screaming out and body stiffening and shaking.  She was again brought to the hospital, and per patient, not started on medication.  A month later, she had a third nocturnal convulsion and saw her PCP who started her on Dilantin '400mg'$ /day.  She may or may not have seen a neurologist, she is unsure, but does not recall having an EEG done.  She recalls being told that she had UTIs when she had the seizures.  She was seizure-free for 9 years until 2013 when she forgot to take her medication and had another nocturnal convulsion where she fractured her right arm.  Dilantin dose was increased to '600mg'$ /day.  After a few months,  she started having dizziness and difficulty ambulating.  She went to the ER where he Dilantin level was supratherapeutic and she was instructed to stop the medication for a few days then restart at '200mg'$  BID.  She has been doing well since with no seizures and no further side effects.  She and her husband deny any staring/unresponsive episodes, no gaps in time, olfactory/gustatory hallucinations, deja vu, rising epigastric sensation, focal numbness/tingling/weakness, myoclonic jerks. She denies any dizziness, diplopia, gait instability, gum problems.  She had a DEXA scan in 11/2012 and has a diagnosis of osteoporosis.  She denies any headaches, dysarthria, dysphagia, neck/back pain, bowel/bladder dysfunction.  No chest pain or shortness of breath.  She is driving.  She was in the ER last 05/11/17 after a breakthrough nocturnal seizure. She recalls seeing her husband go to the bathroom at 5 or 6 in the morning, then has no recollection of events until waking up with EMS around her. Apparently she vocalized in bed and started having a convulsion with both arms drawn up, shaking and stiff for around 2 minutes. She was confused on EMS arrival, but back to baseline in the ER. She reports taking only '500mg'$  of Keppra the night prior because she thought she already took it. She denies any sleep deprivation. Bloodwork unremarkable, head CT no acute changes. She had an abnormal urinalysis with small LE, WBC 9, many bacteria, moderate blood. Urine culture showed 10,000-25,000 cfu/ml with polymicrobial growth. She was discharged on antibiotics. She did not have any UTI symptoms, but states that breakthrough seizures  in the past occurred with a UTI or missing a dose of medication. She was complaining of back pain, and thoracic xray showed a T2 compression fracture, likely subacute, as well as remote T3-7 compression fractures with advanced height loss. She was given a prescription for Percocet, she states Tylenol does not help.  Her Keppra level was 23.1. Platelet count was 86 (121 in 02/2017).  Epilepsy Risk Factors:  Her maternal aunt had seizures, otherwise she had a normal birth and early development.  There is no history of febrile convulsions, CNS infections such as meningitis/encephalitis, significant traumatic brain injury, neurosurgical procedures.  Prior AEDs: Dilantin  EEG 05/01/14: occasional independent focal slowing over the bilateral temporal regions, left greater than right. Imaging:  Head CT without contrast done 07/13/2013 reported as unremarkable. I personally reviewed MRI brain with and without contrast done 10/03/17 which did not show any acute changes, hippocampi symmetric without abnormal signal or enhancement.    PAST MEDICAL HISTORY: Past Medical History:  Diagnosis Date   Bowel obstruction ([place])    Cataract    surgically removed   Colitis    Crohn's disease ([place])    Epilepsy ([place])    Hyperlipidemia    Malignant neoplasm of breast (female), unspecified site    right - do not use right arm for BP, IV or blood draws   Malignant neoplasm of corpus uteri, except isthmus ([facility])    Osteoporosis, unspecified    Personal history of colonic polyps    hyperplastic   Pneumonia    Renal cyst    Seizure disorder ([place])    last seizure was 07/2017, controlled with meds   SVD (spontaneous vaginal delivery)    x 2   Thrombocytopenia ([facility])     MEDICATIONS: Current Outpatient Medications on File Prior to Visit  Medication Sig Dispense Refill   acetaminophen (TYLENOL) 500 MG tablet Take 1,000 mg by mouth every 6 (six) hours as needed.      albuterol (VENTOLIN HFA) 108 (90 Base) MCG/ACT inhaler Inhale 2 puffs into the lungs every 6 (six) hours as needed for wheezing or shortness of breath.     Calcium Carb-Cholecalciferol (CALCIUM 1000 + D PO) Take 1,000 mg by mouth 2 (two) times daily.     cholecalciferol (VITAMIN D3) 25 MCG (1000 UNIT) tablet Take 1,000 Units by mouth daily.     Cranberry 500 MG  CAPS Take 500 mg by mouth daily.     cyanocobalamin (,VITAMIN B-12,) 1000 MCG/ML injection Inject 1,000 mcg into the muscle every 30 (thirty) days.      diphenoxylate-atropine (LOMOTIL) 2.5-0.025 MG tablet TAKE 1 TABLET BY MOUTH ONCE DAILY TO TWICE DAILY AS NEEDED FOR  DIARRHEA 90 tablet 11   diphenoxylate-atropine (LOMOTIL) 2.5-0.025 MG tablet Take one tablet by mouth 1 to 2 times a day as needed for diarrhea 90 tablet 11   lamoTRIgine (LAMICTAL) 25 MG tablet Take 2 tablets twice a day 360 tablet 3   levETIRAcetam (KEPPRA XR) 500 MG 24 hr tablet TAKE 4 TABLETS BY MOUTH AT BEDTIME 360 tablet 3   Multiple Vitamins-Minerals (VISION-VITE PRESERVE PO) Take 1 tablet by mouth 2 (two) times daily.     PENTASA 500 MG CR capsule TAKE 2  BY MOUTH THREE TIMES DAILY 180 capsule 11   potassium chloride SA (K-DUR,KLOR-CON) 20 MEQ tablet Take 40 mEq by mouth 2 (two) times daily.     No current facility-administered medications on file prior to visit.    ALLERGIES: Allergies  Allergen Reactions  Ciprofloxacin Other (See Comments)    Seizures   Ampicillin Diarrhea   Penicillins Diarrhea    Has patient had a PCN reaction causing immediate rash, facial/tongue/throat swelling, SOB or lightheadedness with hypotension: No Has patient had a PCN reaction causing severe rash involving mucus membranes or skin necrosis: No Has patient had a PCN reaction that required hospitalization No Has patient had a PCN reaction occurring within the last 10 years: No If all of the above answers are "NO", then may proceed with Cephalosporin use.   Pneumococcal Vac Polyvalent Other (See Comments)    Local swelling   Tetanus Toxoid Swelling    Arm swelling    FAMILY HISTORY: Family History  Problem Relation Age of Onset   Heart disease Mother    Aneurysm Father    Breast cancer Maternal Aunt    Colon cancer Neg Hx    Rectal cancer Neg Hx    Stomach cancer Neg Hx    Esophageal cancer Neg Hx    Pancreatic cancer Neg Hx     Liver disease Neg Hx     SOCIAL HISTORY: Social History   Socioeconomic History   Marital status: Married    Spouse name: Not on file   Number of children: 2   Years of education: Not on file   Highest education level: Not on file  Occupational History   Occupation: Retired    Fish farm manager: RETIRED  Tobacco Use   Smoking status: Never   Smokeless tobacco: Never  Vaping Use   Vaping Use: Never used  Substance and Sexual Activity   Alcohol use: No   Drug use: No   Sexual activity: Yes    Birth control/protection: Post-menopausal  Other Topics Concern   Not on file  Social History Narrative   Pt lives at home with her husband in a one story house.   Right handed   Social Determinants of Health   Financial Resource Strain: Not on file  Food Insecurity: Not on file  Transportation Needs: Not on file  Physical Activity: Not on file  Stress: Not on file  Social Connections: Not on file  Intimate Partner Violence: Not on file     PHYSICAL EXAM: Vitals:   01/06/23 1424  BP: 108/73  Pulse: 98  SpO2: 92%   General: No acute distress Head:  Normocephalic/atraumatic Skin/Extremities: No rash, no edema Neurological Exam: alert and awake. No aphasia or dysarthria. Fund of knowledge is appropriate.  Attention and concentration are normal.   Cranial nerves: Pupils equal, round. Extraocular movements intact with no nystagmus. Visual fields full.  No facial asymmetry.  Motor: Bulk and tone normal, muscle strength 5/5 throughout with no pronator drift.   Finger to nose testing intact.  Gait narrow-based and steady, able to tandem walk adequately.  Romberg negative.   IMPRESSION: This is a pleasant 77 yo RH woman with a history of Crohn's disease, uterine cancer, breast cancer s/p lumpectomy, chemo, and radiation therapy, with a history of convulsive seizures that occur out of sleep since age 6 or 50. MRI brain normal, EEG showed bilateral temporal slowing, left greater than  right. Etiology of seizures unclear. She continues to do well seizure-free since 2018 on Levetiracetam ER '2000mg'$  qhs ('500mg'$  4 tabs qhs) and Lamotrigine '50mg'$  BID ('25mg'$  2 tabs BID), refills sent. She is aware of [place] driving laws to stop driving after a seizure until 6 months seizure-free. Follow-up in 1 year, call for any changes.   Thank you for allowing me  to participate in her care.  Please do not hesitate to call for any questions or concerns.    [name], M.D.   CC: Estrella Deeds, NP

## 2023-01-17 ENCOUNTER — Other Ambulatory Visit: Payer: Self-pay

## 2023-01-18 ENCOUNTER — Other Ambulatory Visit: Payer: Self-pay

## 2023-03-07 ENCOUNTER — Other Ambulatory Visit: Payer: Self-pay

## 2023-03-08 ENCOUNTER — Other Ambulatory Visit: Payer: Self-pay

## 2023-04-14 ENCOUNTER — Other Ambulatory Visit: Payer: Self-pay

## 2023-04-15 ENCOUNTER — Other Ambulatory Visit: Payer: Self-pay

## 2023-05-02 ENCOUNTER — Other Ambulatory Visit: Payer: Self-pay

## 2023-05-05 ENCOUNTER — Telehealth: Payer: Self-pay | Admitting: Pharmacy Technician

## 2023-05-05 NOTE — Telephone Encounter (Signed)
Auth Submission: NO AUTH NEEDED Site of care: Site of care: [facility] Payer: [employer] MEDICARE Medication & CPT/J Code(s) submitted: Reclast (Zolendronic acid) [id number] Route of submission (phone, fax, portal):  Phone # Fax # Auth type: Buy/Bill Units/visits requested: 1 Reference number:  Approval from: 05/05/23 to 12/13/23

## 2023-05-18 ENCOUNTER — Ambulatory Visit (INDEPENDENT_AMBULATORY_CARE_PROVIDER_SITE_OTHER): Payer: Medicare PPO

## 2023-05-18 VITALS — BP 109/71 | HR 93 | Temp 98.5°F | Resp 20 | Ht 64.0 in | Wt 108.0 lb

## 2023-05-18 DIAGNOSIS — M810 Age-related osteoporosis without current pathological fracture: Secondary | ICD-10-CM | POA: Diagnosis not present

## 2023-05-18 MED ORDER — ZOLEDRONIC ACID 5 MG/100ML IV SOLN
5.0000 mg | Freq: Once | INTRAVENOUS | Status: AC
  Administered 2023-05-18: 5 mg via INTRAVENOUS
  Filled 2023-05-18: qty 100

## 2023-05-18 MED ORDER — SODIUM CHLORIDE 0.9 % IV SOLN: INTRAVENOUS | Status: DC

## 2023-05-18 MED ORDER — ACETAMINOPHEN 325 MG PO TABS
650.0000 mg | ORAL_TABLET | Freq: Once | ORAL | Status: AC
  Administered 2023-05-18: 650 mg via ORAL
  Filled 2023-05-18: qty 2

## 2023-05-18 MED ORDER — DIPHENHYDRAMINE HCL 25 MG PO CAPS
25.0000 mg | ORAL_CAPSULE | Freq: Once | ORAL | Status: AC
  Administered 2023-05-18: 25 mg via ORAL
  Filled 2023-05-18: qty 1

## 2023-05-18 NOTE — Progress Notes (Signed)
Diagnosis: Osteoporosis  Provider:  Chilton Greathouse MD  Procedure: IV Infusion  IV Type: Peripheral, IV Location: L Forearm  Reclast (Zolendronic Acid), Dose: 5 mg  Infusion Start Time: 1048  Infusion Stop Time: 1122  Post Infusion IV Care: Peripheral IV Discontinued  Discharge: Condition: Good, Destination: Home . AVS Declined  Performed by:  Garnette Czech, RN

## 2023-06-02 ENCOUNTER — Other Ambulatory Visit: Payer: Self-pay

## 2023-06-03 ENCOUNTER — Other Ambulatory Visit: Payer: Self-pay

## 2023-07-18 ENCOUNTER — Encounter: Payer: Self-pay | Admitting: Internal Medicine

## 2023-07-18 ENCOUNTER — Other Ambulatory Visit: Payer: Self-pay

## 2023-07-19 ENCOUNTER — Other Ambulatory Visit: Payer: Self-pay

## 2023-07-20 ENCOUNTER — Other Ambulatory Visit: Payer: Self-pay

## 2023-09-02 ENCOUNTER — Other Ambulatory Visit: Payer: Self-pay

## 2023-09-05 ENCOUNTER — Other Ambulatory Visit: Payer: Self-pay

## 2023-10-10 ENCOUNTER — Other Ambulatory Visit: Payer: Self-pay

## 2023-10-11 ENCOUNTER — Other Ambulatory Visit: Payer: Self-pay

## 2023-10-24 ENCOUNTER — Telehealth: Payer: Self-pay | Admitting: Internal Medicine

## 2023-10-24 NOTE — Telephone Encounter (Signed)
Contacted by [facility] pulmonologist regarding patient's ILD diagnosis.   Evidence of disease starting in 2013 He has questions as to whether ILD may be secondary to mesalamine but also notes this would be rare.  We reviewed the chart and she started mesalamine in late 2016 after being hospitalized with Crohn's flare.  She did a prednisone taper at that time.  This appears to be her first 5-ASA exposure. Prior to this with Dr. Jarold Motto and Dr. Marina Goodell she was managed with 6-MP but this was eventually stopped due to leukopenia She is status post ileocecectomy  He wonders if another therapy may be better but she is very hesitant to stop her Pentasa because she has been doing so well The timing does not necessarily point to mesalamine but given the chance we may consider changing therapy at follow-up  Patient will make follow-up with me She will continue Pentasa in the meantime

## 2023-12-02 ENCOUNTER — Other Ambulatory Visit: Payer: Self-pay | Admitting: Physician Assistant

## 2023-12-02 ENCOUNTER — Other Ambulatory Visit: Payer: Self-pay

## 2023-12-02 MED ORDER — PENTASA 500 MG PO CPCR: 1000.0000 mg | ORAL_CAPSULE | Freq: Three times a day (TID) | ORAL | 2 refills | Status: DC

## 2023-12-02 MED FILL — Mesalamine Cap ER 500 MG: ORAL | 30 days supply | Qty: 180 | Fill #0 | Status: CN

## 2023-12-02 NOTE — Telephone Encounter (Signed)
Pentasa refilled and pt scheduled to see Dr. Rhea Belton 02/07/24 at 2:30pm. Pt aware of appt.

## 2023-12-02 NOTE — Telephone Encounter (Signed)
Patient requesting to speak with a nurse in regards to medication refill. Please advise.   Please note patient would like to schedule f/u with provider but there are no additional apt on my end.

## 2023-12-06 ENCOUNTER — Other Ambulatory Visit: Payer: Self-pay

## 2023-12-12 ENCOUNTER — Other Ambulatory Visit: Payer: Self-pay

## 2023-12-12 MED ORDER — PENTASA 500 MG PO CPCR
1000.0000 mg | ORAL_CAPSULE | Freq: Three times a day (TID) | ORAL | 2 refills | Status: DC
  Filled 2023-12-12: qty 180, 30d supply, fill #0

## 2023-12-12 NOTE — Telephone Encounter (Signed)
Left message for pt to call back. Script sent to [facility] as requested.

## 2023-12-12 NOTE — Telephone Encounter (Signed)
Requesting prescription be sent to [facility]. Please advise.

## 2023-12-13 ENCOUNTER — Other Ambulatory Visit: Payer: Self-pay

## 2023-12-15 ENCOUNTER — Other Ambulatory Visit: Payer: Self-pay

## 2023-12-19 ENCOUNTER — Encounter: Payer: Self-pay | Admitting: Neurology

## 2023-12-19 ENCOUNTER — Ambulatory Visit: Payer: Medicare PPO | Admitting: Neurology

## 2023-12-19 VITALS — BP 121/77 | HR 98 | Ht 60.0 in | Wt 112.8 lb

## 2023-12-19 DIAGNOSIS — G40309 Generalized idiopathic epilepsy and epileptic syndromes, not intractable, without status epilepticus: Secondary | ICD-10-CM

## 2023-12-19 MED ORDER — LAMOTRIGINE 25 MG PO TABS: ORAL_TABLET | ORAL | 4 refills | Status: DC

## 2023-12-19 MED ORDER — LEVETIRACETAM ER 500 MG PO TB24: ORAL_TABLET | ORAL | 4 refills | Status: DC

## 2023-12-19 NOTE — Patient Instructions (Signed)
 Always a pleasure to see you. Continue all your medications, refills sent. Follow-up in 1 year, call for any changes.    Seizure Precautions: 1. If medication has been prescribed for you to prevent seizures, take it exactly as directed.  Do not stop taking the medicine without talking to your doctor first, even if you have not had a seizure in a long time.   2. Avoid activities in which a seizure would cause danger to yourself or to others.  Don't operate dangerous machinery, swim alone, or climb in high or dangerous places, such as on ladders, roofs, or girders.  Do not drive unless your doctor says you may.  3. If you have any warning that you may have a seizure, lay down in a safe place where you can't hurt yourself.    4.  No driving for 6 months from last seizure, as per [place]  state law.   Please refer to the following link on the Epilepsy Foundation of America's website for more information: http://www.epilepsyfoundation.org/answerplace/Social/driving/drivingu.cfm   5.  Maintain good sleep hygiene. Avoid alcohol.  6.  Contact your doctor if you have any problems that may be related to the medicine you are taking.  7.  Call 911 and bring the patient back to the ED if:        A.  The seizure lasts longer than 5 minutes.       B.  The patient doesn't awaken shortly after the seizure  C.  The patient has new problems such as difficulty seeing, speaking or moving  D.  The patient was injured during the seizure  E.  The patient has a temperature over 102 F (39C)  F.  The patient vomited and now is having trouble breathing

## 2023-12-19 NOTE — Progress Notes (Signed)
 NEUROLOGY FOLLOW UP OFFICE NOTE  [name] [id number] 21-Sep-1946  HISTORY OF PRESENT ILLNESS: I had the pleasure of seeing [name] in follow-up in the neurology clinic on 12/19/2023.  The patient was last seen a year ago for nocturnal seizures. She is alone in the office today.  Records and images were personally reviewed where available. Since her last visit, she denies any seizures since August 2018 on Levetiracetam  ER 2000mg  at bedtime (500mg  4 tabs at bedtime) and Lamotrigine  50mg  BID (25mg  2 tabs BID), no side effects. She denies any staring/unresponsive episodes, gaps in time, olfactory/gustatory hallucinations, focal numbness/tingling/weakness, myoclonic jerks. No headaches, dizziness, vision changes. She had a mechanical fall in 01/2023 and fractured her right elbow and pelvis, no surgery done, she has recovered from this with no further falls. She gets 6-7 hours of sleep. Mood is good. She is driving locally.    HPI: This is a pleasant 78 yo RH woman with a history of Crohn's disease, uterine cancer, and breast cancer s/p lumpectomy, chemo and radiation therapy 5 years ago, who started having seizures at age 78 or 46.  She recalls going to the bathroom then back to bed early morning, then her next recollection was being brought to the hospital.  Her husband heard her scream out followed by whole body stiffening and shaking lasting 2-3 minutes, with associated tongue bite and urinary incontinence.  She was brought to the hospital and not started on medication.  Six months after, she had a second convulsion out of sleep with similar screaming out and body stiffening and shaking.  She was again brought to the hospital, and per patient, not started on medication.  A month later, she had a third nocturnal convulsion and saw her PCP who started her on Dilantin  400mg /day.  She may or may not have seen a neurologist, she is unsure, but does not recall having an EEG done.  She recalls  being told that she had UTIs when she had the seizures.  She was seizure-free for 9 years until 2013 when she forgot to take her medication and had another nocturnal convulsion where she fractured her right arm.  Dilantin  dose was increased to 600mg /day.  After a few months, she started having dizziness and difficulty ambulating.  She went to the ER where he Dilantin  level was supratherapeutic and she was instructed to stop the medication for a few days then restart at 200mg  BID.  She has been doing well since with no seizures and no further side effects.  She and her husband deny any staring/unresponsive episodes, no gaps in time, olfactory/gustatory hallucinations, deja vu, rising epigastric sensation, focal numbness/tingling/weakness, myoclonic jerks. She denies any dizziness, diplopia, gait instability, gum problems.  She had a DEXA scan in 11/2012 and has a diagnosis of osteoporosis.  She denies any headaches, dysarthria, dysphagia, neck/back pain, bowel/bladder dysfunction.  No chest pain or shortness of breath.  She is driving.  She was in the ER last 05/11/17 after a breakthrough nocturnal seizure. She recalls seeing her husband go to the bathroom at 5 or 6 in the morning, then has no recollection of events until waking up with EMS around her. Apparently she vocalized in bed and started having a convulsion with both arms drawn up, shaking and stiff for around 2 minutes. She was confused on EMS arrival, but back to baseline in the ER. She reports taking only 500mg  of Keppra  the night prior because she thought she already took it. She denies  any sleep deprivation. Bloodwork unremarkable, head CT no acute changes. She had an abnormal urinalysis with small LE, WBC 9, many bacteria, moderate blood. Urine culture showed 10,000-25,000 cfu/ml with polymicrobial growth. She was discharged on antibiotics. She did not have any UTI symptoms, but states that breakthrough seizures in the past occurred with a UTI or  missing a dose of medication. She was complaining of back pain, and thoracic xray showed a T2 compression fracture, likely subacute, as well as remote T3-7 compression fractures with advanced height loss. She was given a prescription for Percocet, she states Tylenol  does not help. Her Keppra  level was 23.1. Platelet count was 86 (121 in 02/2017).  Epilepsy Risk Factors:  Her maternal aunt had seizures, otherwise she had a normal birth and early development.  There is no history of febrile convulsions, CNS infections such as meningitis/encephalitis, significant traumatic brain injury, neurosurgical procedures.  Prior AEDs: Dilantin   EEG 05/01/14: occasional independent focal slowing over the bilateral temporal regions, left greater than right. Imaging:  Head CT without contrast done 07/13/2013 reported as unremarkable. I personally reviewed MRI brain with and without contrast done 10/03/17 which did not show any acute changes, hippocampi symmetric without abnormal signal or enhancement.    PAST MEDICAL HISTORY: Past Medical History:  Diagnosis Date   Bowel obstruction (HCC)    Cataract    surgically removed   Colitis    Crohn's disease (HCC)    Epilepsy (HCC)    Hyperlipidemia    Malignant neoplasm of breast (female), unspecified site    right - do not use right arm for BP, IV or blood draws   Malignant neoplasm of corpus uteri, except isthmus (HCC)    Osteoporosis, unspecified    Personal history of colonic polyps    hyperplastic   Pneumonia    Renal cyst    Seizure disorder (HCC)    last seizure was 07/2017, controlled with meds   SVD (spontaneous vaginal delivery)    x 2   Thrombocytopenia (HCC)     MEDICATIONS: Current Outpatient Medications on File Prior to Visit  Medication Sig Dispense Refill   acetaminophen  (TYLENOL ) 500 MG tablet Take 1,000 mg by mouth every 6 (six) hours as needed.      albuterol (VENTOLIN HFA) 108 (90 Base) MCG/ACT inhaler Inhale 2 puffs into the lungs  every 6 (six) hours as needed for wheezing or shortness of breath.     Calcium Carb-Cholecalciferol (CALCIUM 1000 + D PO) Take 1,000 mg by mouth 2 (two) times daily.     cholecalciferol (VITAMIN D3) 25 MCG (1000 UNIT) tablet Take 1,000 Units by mouth daily.     Cranberry 500 MG CAPS Take 500 mg by mouth daily.     cyanocobalamin (,VITAMIN B-12,) 1000 MCG/ML injection Inject 1,000 mcg into the muscle every 30 (thirty) days.      diphenoxylate -atropine  (LOMOTIL ) 2.5-0.025 MG tablet TAKE 1 TABLET BY MOUTH ONCE DAILY TO TWICE DAILY AS NEEDED FOR  DIARRHEA 90 tablet 11   diphenoxylate -atropine  (LOMOTIL ) 2.5-0.025 MG tablet Take one tablet by mouth 1 to 2 times a day as needed for diarrhea 90 tablet 11   lamoTRIgine  (LAMICTAL ) 25 MG tablet Take 2 tablets twice a day 360 tablet 4   levETIRAcetam  (KEPPRA  XR) 500 MG 24 hr tablet TAKE 4 TABLETS BY MOUTH AT BEDTIME 360 tablet 4   Multiple Vitamins-Minerals (VISION-VITE PRESERVE PO) Take 1 tablet by mouth 2 (two) times daily.     PENTASA  500 MG CR capsule Take  2 capsules (1,000 mg total) by mouth 3 (three) times daily. Patient needs follow up appointment for future refills. Please call [phone] to schedule an appointment. 180 capsule 2   potassium chloride  SA (K-DUR,KLOR-CON ) 20 MEQ tablet Take 40 mEq by mouth 2 (two) times daily.     No current facility-administered medications on file prior to visit.    ALLERGIES: Allergies  Allergen Reactions   Ciprofloxacin Other (See Comments)    Seizures   Ampicillin Diarrhea   Penicillins Diarrhea    Has patient had a PCN reaction causing immediate rash, facial/tongue/throat swelling, SOB or lightheadedness with hypotension: No Has patient had a PCN reaction causing severe rash involving mucus membranes or skin necrosis: No Has patient had a PCN reaction that required hospitalization No Has patient had a PCN reaction occurring within the last 10 years: No If all of the above answers are NO, then may  proceed with Cephalosporin use.   Tetanus Toxoid Swelling    Arm swelling    FAMILY HISTORY: Family History  Problem Relation Age of Onset   Heart disease Mother    Aneurysm Father    Breast cancer Maternal Aunt    Colon cancer Neg Hx    Rectal cancer Neg Hx    Stomach cancer Neg Hx    Esophageal cancer Neg Hx    Pancreatic cancer Neg Hx    Liver disease Neg Hx     SOCIAL HISTORY: Social History   Socioeconomic History   Marital status: Married    Spouse name: Not on file   Number of children: 2   Years of education: Not on file   Highest education level: Not on file  Occupational History   Occupation: Retired    Associate Professor: RETIRED  Tobacco Use   Smoking status: Never   Smokeless tobacco: Never  Vaping Use   Vaping status: Never Used  Substance and Sexual Activity   Alcohol use: No   Drug use: No   Sexual activity: Yes    Birth control/protection: Post-menopausal  Other Topics Concern   Not on file  Social History Narrative   Pt lives at home with her husband in a one story house.   Right handed   Social Drivers of Health   Financial Resource Strain: Low Risk  (10/21/2023)   Received from [facility]   Overall Financial Resource Strain (CARDIA)    Difficulty of Paying Living Expenses: Not hard at all  Food Insecurity: No Food Insecurity (04/14/2023)   Received from [facility], [facility]   Hunger Vital Sign    Worried About Running Out of Food in the Last Year: Never true    Ran Out of Food in the Last Year: Never true  Transportation Needs: No Transportation Needs (04/14/2023)   Received from [facility], [facility]   [facility] - Transportation    Lack of Transportation (Medical): No    Lack of Transportation (Non-Medical): No  Physical Activity: Not on file  Stress: Not on file  Social Connections: Not on file  Intimate Partner Violence: Not At Risk (10/21/2023)   Received from [facility]   Humiliation, Afraid, Rape, and Kick  questionnaire    Fear of Current or Ex-Partner: No    Emotionally Abused: No    Physically Abused: No    Sexually Abused: No     PHYSICAL EXAM: Vitals:   12/19/23 0951  BP: 121/77  Pulse: 98  SpO2: 94%   General:  No acute distress Head:  Normocephalic/atraumatic Skin/Extremities: No rash, no edema Neurological Exam: alert and awake. No aphasia or dysarthria. Fund of knowledge is appropriate.  Attention and concentration are normal.   Cranial nerves: Pupils equal, round. Extraocular movements intact with no nystagmus. Visual fields full.  No facial asymmetry.  Motor: Bulk and tone normal, muscle strength 5/5 throughout with no pronator drift.   Finger to nose testing intact.  Gait narrow-based and steady, no ataxia.  Romberg negative.   IMPRESSION: This is a pleasant 78 yo RH woman with a history of Crohn's disease, uterine cancer, breast cancer s/p lumpectomy, chemo, and radiation therapy, with a history of convulsive seizures that occur out of sleep since age 34 or 8. MRI brain normal, EEG showed bilateral temporal slowing, left greater than right. Etiology of seizures unclear. She continues to do well seizure-free since 2018 on Levetiracetam  2000mg  at bedtime and Lamotrigine  50mg  BID, refills sent. She is aware of [place] driving laws to stop driving after a seizure until 6 months seizure-free. Follow-up in 1 year, call for any changes.   Thank you for allowing me to participate in her care.  Please do not hesitate to call for any questions or concerns.    Darice Shivers, M.D.   CC: Arland Sharps, NP

## 2024-02-01 ENCOUNTER — Other Ambulatory Visit: Payer: Self-pay

## 2024-02-07 ENCOUNTER — Encounter: Payer: Self-pay | Admitting: Internal Medicine

## 2024-02-07 ENCOUNTER — Ambulatory Visit: Payer: Medicare PPO | Admitting: Internal Medicine

## 2024-02-07 VITALS — BP 126/72 | HR 82 | Ht 60.0 in | Wt 110.2 lb

## 2024-02-07 DIAGNOSIS — J849 Interstitial pulmonary disease, unspecified: Secondary | ICD-10-CM | POA: Diagnosis not present

## 2024-02-07 DIAGNOSIS — R195 Other fecal abnormalities: Secondary | ICD-10-CM

## 2024-02-07 DIAGNOSIS — E538 Deficiency of other specified B group vitamins: Secondary | ICD-10-CM | POA: Diagnosis not present

## 2024-02-07 DIAGNOSIS — K50819 Crohn's disease of both small and large intestine with unspecified complications: Secondary | ICD-10-CM | POA: Diagnosis not present

## 2024-02-07 MED ORDER — DIPHENOXYLATE-ATROPINE 2.5-0.025 MG PO TABS: ORAL_TABLET | ORAL | 1 refills | Status: AC

## 2024-02-07 MED ORDER — PENTASA 500 MG PO CPCR: 1000.0000 mg | ORAL_CAPSULE | Freq: Three times a day (TID) | ORAL | 11 refills | Status: AC

## 2024-02-07 NOTE — Patient Instructions (Signed)
 We have sent the following medications to your pharmacy for you to pick up at your convenience: Pentasa and Lomotil.   Please follow up with Dr. Rhea Belton in one year.  _______________________________________________________  If your blood pressure at your visit was 140/90 or greater, please contact your primary care physician to follow up on this.  _______________________________________________________  If you are age 80 or older, your body mass index should be between 23-30. Your Body mass index is 21.53 kg/m. If this is out of the aforementioned range listed, please consider follow up with your Primary Care Provider.  If you are age 9 or younger, your body mass index should be between 19-25. Your Body mass index is 21.53 kg/m. If this is out of the aformentioned range listed, please consider follow up with your Primary Care Provider.   ________________________________________________________  The [facility] providers would like to encourage you to use [facility] to communicate with providers for non-urgent requests or questions.  Due to long hold times on the telephone, sending your provider a message by [facility] may be a faster and more efficient way to get a response.  Please allow 48 business hours for a response.  Please remember that this is for non-urgent requests.  _______________________________________________________

## 2024-02-08 ENCOUNTER — Encounter: Payer: Self-pay | Admitting: Internal Medicine

## 2024-02-08 NOTE — Progress Notes (Signed)
 Subjective:    Patient ID: [name], female    DOB: 1946-07-28, 78 y.o.   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  She is here alone today and was last seen in November 2023 by Mike Gip, PA-C.  She has a long-standing history of ileoclonic Crohn's disease, diagnosed over twenty years ago. She underwent an ileocecectomy and was previously on , which was discontinued due to leukopenia. She has been on mesalamine (Pentasa) since 2016, taking 500 mg, two tablets three times a day. She experiences chronic loose stools occurring five to six times daily, which she manages with Lomotil as needed, particularly on Sundays and when traveling. Her bowel habits vary depending on her diet, but she denies any bleeding or abdominal pain.  Her last colonoscopy in December 2019 showed mild anal stricture and a patent end-to-end ileoclonic anastomosis in the ascending colon. A 10 mm sigmoid polyp was removed and found to be inflammatory. Biopsies showed benign colonic mucosa without active inflammation. Her simple endoscopic score for Crohn's disease was nine, indicating mucosal inflammatory changes due to quiescent Crohn's disease. Recent blood work from November 2024 showed a white count of 7.0, hemoglobin 12.1, MCV 96.1, and normal platelet count of 172. Liver function tests and renal function were within normal limits, and B12 levels were normal at 370. Fecal calprotectin checked in December 2023 was normal at 39.  She has a history of interstitial lung disease (ILD) that was noted to have appeared around 2013. There was a concern raised by her pulmonologist regarding the potential contribution of mesalamine to her ILD, but it was determined that mesalamine, started in 2016, was not associated with  the onset of her lung condition. She continues to take Pentasa as it has been effective for her Crohn's disease.  She lives near her two sons, one of whom resides across the road, and the other about twenty miles away. She has two grandchildren, with one attending [address],  [address] and another in high school. She fractured her elbow, leg, and pelvis in a fall last February, requiring home rehabilitation.   Review of Systems As per HPI, otherwise negative  Current Medications, Allergies, Past Medical History, Past Surgical History, Family History and Social History were reviewed in [employer] record.     Objective:   Physical Exam BP 126/72   Pulse 82   Ht 5' (1.524 m)   Wt 110 lb 4 oz (50 kg)   BMI 21.53 kg/m  Gen: awake, alert, NAD HEENT: anicteric  CV: RRR, no mrg Pulm: CTA b/l Abd: soft, NT/ND, +BS throughout Ext: no c/c/e Neuro: nonfocal  LABS Fecal calprotectin: 39 (11/18/2022) WBC: 7.0 (10/21/2023) Hb: 12.1 (10/21/2023) MCV: 96.1 (10/21/2023) PLT: 172 (10/21/2023) AST: 27 (10/21/2023) ALT: 13 (10/21/2023) Alkaline phosphatase: 81 (10/21/2023) Total bilirubin: 0.3 (10/21/2023) Albumin: 4.0 (10/21/2023) BUN: 10 (10/21/2023) Creatinine: 1.0 (10/21/2023) GFR: 57 (10/21/2023) B12: 370 (10/21/2023)  DIAGNOSTIC Colonoscopy: Mild anal stricture, patent end-to-end ileocolonic anastomosis in ascending colon, simple endoscopic score for Crohn's disease: 9, 10 mm sigmoid polyp removed with hot snare, left-sided diverticulosis, all biopsies showed benign colonic mucosa without active inflammation (11/30/2018)     Assessment & Plan:  78 year old female with 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   Ileocolonic Crohn's Disease, longstanding In clinical and endoscopic remission since last colonoscopy in 2019.  Chronic  loose stools managed with Lomotil as needed. No current abdominal pain or bleeding. Fecal calprotectin was normal a year ago indicative of good disease control. Discussed potential contribution of mesalamine to ILD with pulmonologist, but temporal association does not support this. Patient reports noticeable difference in symptoms when off Pentasa. -Continue Pentasa 500mg , two tablets three times a day. -Refill Lomotil as needed, which she is rarely using typically once a day 2 days a week or less. -Consider colonoscopy if symptoms worsen, but currently no strong indication.  Patient in agreement with no colonoscopy decision and plan.  B12 deficiency Continue supplementation  Interstitial Lung Disease (ILD) Discussed potential association with mesalamine use, but temporal association does not support this. No current plan to discontinue Pentasa. -Continue current management under pulmonologist.  Annual follow-up, sooner if needed  30 minutes total spent today including patient facing time, coordination of care, reviewing medical history/procedures/pertinent radiology studies, and documentation of the encounter.

## 2024-10-22 ENCOUNTER — Encounter: Payer: Self-pay | Admitting: Neurology

## 2024-12-18 ENCOUNTER — Ambulatory Visit: Payer: Medicare PPO | Admitting: Neurology

## 2024-12-18 ENCOUNTER — Encounter: Payer: Self-pay | Admitting: Neurology

## 2024-12-18 DIAGNOSIS — G40309 Generalized idiopathic epilepsy and epileptic syndromes, not intractable, without status epilepticus: Secondary | ICD-10-CM

## 2024-12-18 MED ORDER — LAMOTRIGINE 25 MG PO TABS: ORAL_TABLET | ORAL | 4 refills | Status: AC

## 2024-12-18 MED ORDER — LEVETIRACETAM ER 500 MG PO TB24: ORAL_TABLET | ORAL | 4 refills | Status: AC

## 2024-12-18 NOTE — Patient Instructions (Signed)
 It's always a pleasure to see you. Continue Levetiracetam  500mg : take 4 tablets every night and Lamotrigine  25mg : take 2 tablets twice a day.  Follow-up in 1 year, call for any changes.    Seizure Precautions: 1. If medication has been prescribed for you to prevent seizures, take it exactly as directed.  Do not stop taking the medicine without talking to your doctor first, even if you have not had a seizure in a long time.   2. Avoid activities in which a seizure would cause danger to yourself or to others.  Don't operate dangerous machinery, swim alone, or climb in high or dangerous places, such as on ladders, roofs, or girders.  Do not drive unless your doctor says you may.  3. If you have any warning that you may have a seizure, lay down in a safe place where you can't hurt yourself.    4.  No driving for 6 months from last seizure, as per [place]  state law.   Please refer to the following link on the Epilepsy Foundation of America's website for more information: http://www.epilepsyfoundation.org/answerplace/Social/driving/drivingu.cfm   5.  Maintain good sleep hygiene. Avoid alcohol.  6.  Contact your doctor if you have any problems that may be related to the medicine you are taking.  7.  Call 911 and bring the patient back to the ED if:        A.  The seizure lasts longer than 5 minutes.       B.  The patient doesn't awaken shortly after the seizure  C.  The patient has new problems such as difficulty seeing, speaking or moving  D.  The patient was injured during the seizure  E.  The patient has a temperature over 102 F (39C)  F.  The patient vomited and now is having trouble breathing

## 2024-12-18 NOTE — Progress Notes (Signed)
 "  NEUROLOGY FOLLOW UP OFFICE NOTE  [name] [id number] 1946/11/27  Discussed the use of AI scribe software for clinical note transcription with the patient, who gave verbal consent to proceed.  History of Present Illness I had the pleasure of seeing [name] in follow-up in the neurology clinic 79 on 12/18/2024.  The patient was last seen 79 years ago for nocturnal seizures. She is alone in the office today. Records and images were personally reviewed where available.  Since her last visit, she continues to do well seizure-free since August 2018 on Levetiracetam  ER 2000mg  at bedtime (500mg  4 tabs at bedtime) and Lamotrigine  50mg  BID (25mg  2 tabs BID). No side effects on medications. She denies any staring/unresponsive episodes, gaps in time, olfactory/gustatory hallucinations, focal numbness/tingling/weakness, myoclonic jerks. No headaches, dizziness, vision changes, no falls.   She uses oxygen therapy for shortness of breath at night and during activities such as walking in a mall. She feels short of breath during longer walks but is fine with short errands. She was started on Cellcept and maintains the lower dose due to concerns about potential side effects, including seizures.  She gets about 8 hours of sleep per night. Mood and memory are good. She drives locally.   HPI: This is a pleasant 79 yo RH woman with a history of Crohn's disease, uterine cancer, and breast cancer s/p lumpectomy, chemo and radiation therapy 5 years ago, who started having seizures at age 79 or 79.  She recalls going to the bathroom then back to bed early morning, then her next recollection was being brought to the hospital.  Her husband heard her scream out followed by whole body stiffening and shaking lasting 2-3 minutes, with associated tongue bite and urinary incontinence.  She was brought to the hospital and not started on medication.  Six months after, she had a second convulsion out of sleep with similar  screaming out and body stiffening and shaking.  She was again brought to the hospital, and per patient, not started on medication.  A month later, she had a third nocturnal convulsion and saw her PCP who started her on Dilantin  400mg /day.  She may or may not have seen a neurologist, she is unsure, but does not recall having an EEG done.  She recalls being told that she had UTIs when she had the seizures.  She was seizure-free for 9 years until 2013 when she forgot to take her medication and had another nocturnal convulsion where she fractured her right arm.  Dilantin  dose was increased to 600mg /day.  After a few months, she started having dizziness and difficulty ambulating.  She went to the ER where he Dilantin  level was supratherapeutic and she was instructed to stop the medication for a few days then restart at 200mg  BID.  She has been doing well since with no seizures and no further side effects.  She and her husband deny any staring/unresponsive episodes, no gaps in time, olfactory/gustatory hallucinations, deja vu, rising epigastric sensation, focal numbness/tingling/weakness, myoclonic jerks. She denies any dizziness, diplopia, gait instability, gum problems.  She had a DEXA scan in 11/2012 and has a diagnosis of osteoporosis.  She denies any headaches, dysarthria, dysphagia, neck/back pain, bowel/bladder dysfunction.  No chest pain or shortness of breath.  She is driving.  She was in the ER last 05/11/17 after a breakthrough nocturnal seizure. She recalls seeing her husband go to the bathroom at 5 or 6 in the morning, then has no recollection of events until  waking up with EMS around her. Apparently she vocalized in bed and started having a convulsion with both arms drawn up, shaking and stiff for around 2 minutes. She was confused on EMS arrival, but back to baseline in the ER. She reports taking only 500mg  of Keppra  the night prior because she thought she already took it. She denies any sleep deprivation.  Bloodwork unremarkable, head CT no acute changes. She had an abnormal urinalysis with small LE, WBC 9, many bacteria, moderate blood. Urine culture showed 10,000-25,000 cfu/ml with polymicrobial growth. She was discharged on antibiotics. She did not have any UTI symptoms, but states that breakthrough seizures in the past occurred with a UTI or missing a dose of medication. She was complaining of back pain, and thoracic xray showed a T2 compression fracture, likely subacute, as well as remote T3-7 compression fractures with advanced height loss. She was given a prescription for Percocet, she states Tylenol  does not help. Her Keppra  level was 23.1. Platelet count was 86 (121 in 02/2017).  Epilepsy Risk Factors:  Her maternal aunt had seizures, otherwise she had a normal birth and early development.  There is no history of febrile convulsions, CNS infections such as meningitis/encephalitis, significant traumatic brain injury, neurosurgical procedures.  Prior AEDs: Dilantin   EEG 05/01/14: occasional independent focal slowing over the bilateral temporal regions, left greater than right. Imaging:  Head CT without contrast done 07/13/2013 reported as unremarkable. I personally reviewed MRI brain with and without contrast done 10/03/17 which did not show any acute changes, hippocampi symmetric without abnormal signal or enhancement.   PAST MEDICAL HISTORY: Past Medical History:  Diagnosis Date   Bowel obstruction (HCC)    Cataract    surgically removed   Colitis    Crohn's disease (HCC)    Epilepsy (HCC)    Hyperlipidemia    Malignant neoplasm of breast (female), unspecified site    right - do not use right arm for BP, IV or blood draws   Malignant neoplasm of corpus uteri, except isthmus (HCC)    Osteoporosis, unspecified    Personal history of colonic polyps    hyperplastic   Pneumonia    Renal cyst    Seizure disorder (HCC)    last seizure was 07/2017, controlled with meds   SVD (spontaneous  vaginal delivery)    x 2   Thrombocytopenia     MEDICATIONS: Medications Ordered Prior to Encounter[1]  ALLERGIES: Allergies[2]  FAMILY HISTORY: Family History  Problem Relation Age of Onset   Heart disease Mother    Aneurysm Father    Breast cancer Maternal Aunt    Colon cancer Neg Hx    Rectal cancer Neg Hx    Stomach cancer Neg Hx    Esophageal cancer Neg Hx    Pancreatic cancer Neg Hx    Liver disease Neg Hx     SOCIAL HISTORY: Social History   Socioeconomic History   Marital status: Married    Spouse name: Not on file   Number of children: 2   Years of education: Not on file   Highest education level: Not on file  Occupational History   Occupation: Retired    Associate Professor: RETIRED  Tobacco Use   Smoking status: Never   Smokeless tobacco: Never  Vaping Use   Vaping status: Never Used  Substance and Sexual Activity   Alcohol use: No   Drug use: No   Sexual activity: Yes    Birth control/protection: Post-menopausal  Other Topics Concern  Not on file  Social History Narrative   Pt lives at home with her husband in a one story house.   Right handed   Social Drivers of Health   Tobacco Use: Low Risk (11/02/2024)   Received [facility]   Patient History    Smoking Tobacco Use: Never    Smokeless Tobacco Use: Never    Passive Exposure: Never  Financial Resource Strain: Low Risk (10/21/2023)   [facility]   Overall Financial Resource Strain (CARDIA)    Difficulty of Paying Living Expenses: Not hard at all  Food Insecurity: No Food Insecurity (08/02/2024)   Received from [facility]   Epic    Within the past 12 months, you worried that your food would run out before you got the money to buy more.: Never true    Within the past 12 months, the food you bought just didn't last and you didn't have money to get more.: Never true  Transportation Needs: No Transportation Needs (08/02/2024)   Received from [facility] -  Transportation    Lack of Transportation (Medical): No    Lack of Transportation (Non-Medical): No  Physical Activity: Not on file  Stress: Not on file  Social Connections: Not on file  Intimate Partner Violence: Not At Risk (10/21/2023)   Received from [facility]   Epic    Within the last year, have you been afraid of your partner or ex-partner?: No    Within the last year, have you been humiliated or emotionally abused in other ways by your partner or ex-partner?: No    Within the last year, have you been kicked, hit, slapped, or otherwise physically hurt by your partner or ex-partner?: No    Within the last year, have you been raped or forced to have any kind of sexual activity by your partner or ex-partner?: No  Depression (PHQ2-9): Not on file  Alcohol Screen: Not on file  Housing: Not on file  Utilities: Low Risk (08/02/2024)   Received from [facility]   Utilities    Within the past 12 months, have you been unable to get utilities(heat, electricity) when it was really needed?: No  Health Literacy: Low Risk (10/21/2023)   Received from [facility] Literacy    How often do you need to have someone help you when you read instructions, pamphlets, or other written material from your doctor or pharmacy?: Never     PHYSICAL EXAM: Vitals:   12/18/24 0930  BP: 113/71  Pulse: (!) 110  SpO2: 94%   General: No acute distress Head:  Normocephalic/atraumatic Skin/Extremities: No rash, no edema Neurological Exam: alert and awake. No aphasia or dysarthria. Fund of knowledge is appropriate.  Attention and concentration are normal.   Cranial nerves: Pupils equal, round. Extraocular movements intact with no nystagmus. Visual fields full.  No facial asymmetry.  Motor: Bulk and tone normal, muscle strength 5/5 throughout with no pronator drift.   Finger to nose testing intact.  Gait narrow-based and steady, mild difficulty with tandem walk but able. Romberg  negative.   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 MRI brain normal, EEG showed bilateral temporal slowing, left greater than right. Etiology of seizures unclear. She  has been seizure-free since 2018 on Levetiracetam  2000mg  at bedtime and Lamotrigine  50mg  BID, refills sent. She is aware of [place] driving laws to stop driving after a seizure until 6 months seizure-free. Follow-up in 1 year, call for any changes.     Thank you for allowing me to participate in her care.  Please do not hesitate to call for any questions or concerns.    Darice Shivers, M.D.   CC: Arland Sharps, NP     [1]  Current Outpatient Medications on File Prior to Visit  Medication Sig Dispense Refill   acetaminophen  (TYLENOL ) 500 MG tablet Take 1,000 mg by mouth every 6 (six) hours as needed.      albuterol (VENTOLIN HFA) 108 (90 Base) MCG/ACT inhaler Inhale 2 puffs into the lungs every 6 (six) hours as needed for wheezing or shortness of breath.     Calcium Carb-Cholecalciferol (CALCIUM 1000 + D PO) Take 1,000 mg by mouth 2 (two) times daily.     cholecalciferol (VITAMIN D3) 25 MCG (1000 UNIT) tablet Take 1,000 Units by mouth daily.     Cranberry 500 MG CAPS Take 500 mg by mouth daily.     cyanocobalamin  (,VITAMIN B-12,) 1000 MCG/ML injection Inject 1,000 mcg into the muscle every 30 (thirty) days.      diphenoxylate -atropine  (LOMOTIL ) 2.5-0.025 MG tablet Take one tablet by mouth 1 to 2 times a day as needed for diarrhea 60 tablet 1   lamoTRIgine  (LAMICTAL ) 25 MG tablet Take 2 tablets twice a day 360 tablet 4   levETIRAcetam  (KEPPRA  XR) 500 MG 24 hr tablet TAKE 4 TABLETS BY MOUTH AT BEDTIME 360 tablet 4   Multiple Vitamins-Minerals (VISION-VITE PRESERVE PO) Take 1 tablet by mouth 2 (two) times daily.     PENTASA  500 MG CR  capsule Take 2 capsules (1,000 mg total) by mouth 3 (three) times daily. 180 capsule 11   potassium chloride  SA (K-DUR,KLOR-CON ) 20 MEQ tablet Take 40 mEq by mouth 2 (two) times daily.     No current facility-administered medications on file prior to visit.  [2]  Allergies Allergen Reactions   Ciprofloxacin Other (See Comments)    Seizures   Ampicillin Diarrhea   Penicillins Diarrhea    Has patient had a PCN reaction causing immediate rash, facial/tongue/throat swelling, SOB or lightheadedness with hypotension: No Has patient had a PCN reaction causing severe rash involving mucus membranes or skin necrosis: No Has patient had a PCN reaction that required hospitalization No Has patient had a PCN reaction occurring within the last 10 years: No If all of the above answers are NO, then may proceed with Cephalosporin use.   Tetanus Toxoid Swelling    Arm swelling   "

## 2025-12-18 ENCOUNTER — Ambulatory Visit: Payer: Self-pay | Admitting: Neurology
